# Patient Record
Sex: Male | Born: 1945
Health system: Southern US, Community
[De-identification: ages and names within clinical notes are randomized; demographics above are authoritative.]

## PROBLEM LIST (undated history)

## (undated) DIAGNOSIS — I10 Essential (primary) hypertension: Secondary | ICD-10-CM

## (undated) DIAGNOSIS — E785 Hyperlipidemia, unspecified: Secondary | ICD-10-CM

## (undated) DIAGNOSIS — I4891 Unspecified atrial fibrillation: Secondary | ICD-10-CM

## (undated) DIAGNOSIS — D126 Benign neoplasm of colon, unspecified: Secondary | ICD-10-CM

## (undated) DIAGNOSIS — I509 Heart failure, unspecified: Secondary | ICD-10-CM

## (undated) HISTORY — DX: Hyperlipidemia, unspecified: E78.5

## (undated) HISTORY — DX: Essential (primary) hypertension: I10

## (undated) HISTORY — DX: Benign neoplasm of colon, unspecified: D12.6

---
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
## 2001-01-14 DIAGNOSIS — D126 Benign neoplasm of colon, unspecified: Secondary | ICD-10-CM

## 2001-01-14 HISTORY — DX: Benign neoplasm of colon, unspecified: D12.6

## 2001-08-03 LAB — HM COLONOSCOPY

## 2004-06-10 ENCOUNTER — Emergency Department (HOSPITAL_COMMUNITY): Admission: EM | Admit: 2004-06-10 | Discharge: 2004-06-10 | Payer: Self-pay | Admitting: Emergency Medicine

## 2005-01-14 HISTORY — PX: ABDOMINAL AORTIC ANEURYSM REPAIR: SUR1152

## 2005-07-14 ENCOUNTER — Encounter (INDEPENDENT_AMBULATORY_CARE_PROVIDER_SITE_OTHER): Payer: Self-pay | Admitting: Specialist

## 2005-07-14 ENCOUNTER — Inpatient Hospital Stay (HOSPITAL_COMMUNITY): Admission: EM | Admit: 2005-07-14 | Discharge: 2005-08-13 | Payer: Self-pay | Admitting: Emergency Medicine

## 2005-07-14 ENCOUNTER — Ambulatory Visit: Payer: Self-pay | Admitting: Internal Medicine

## 2006-06-26 ENCOUNTER — Encounter: Admission: RE | Admit: 2006-06-26 | Discharge: 2006-06-26 | Payer: Self-pay | Admitting: Family Medicine

## 2007-03-17 IMAGING — CR DG CHEST 1V PORT
1 series · 1 of 1 positions shown · non-contrast
Comparison: 07/30/05.

CLINICAL DATA: Syncope.  Aneurysm repair.
 PORTABLE CHEST ONE VIEW, 07/31/05, 6646 HOURS:

[view not recorded]
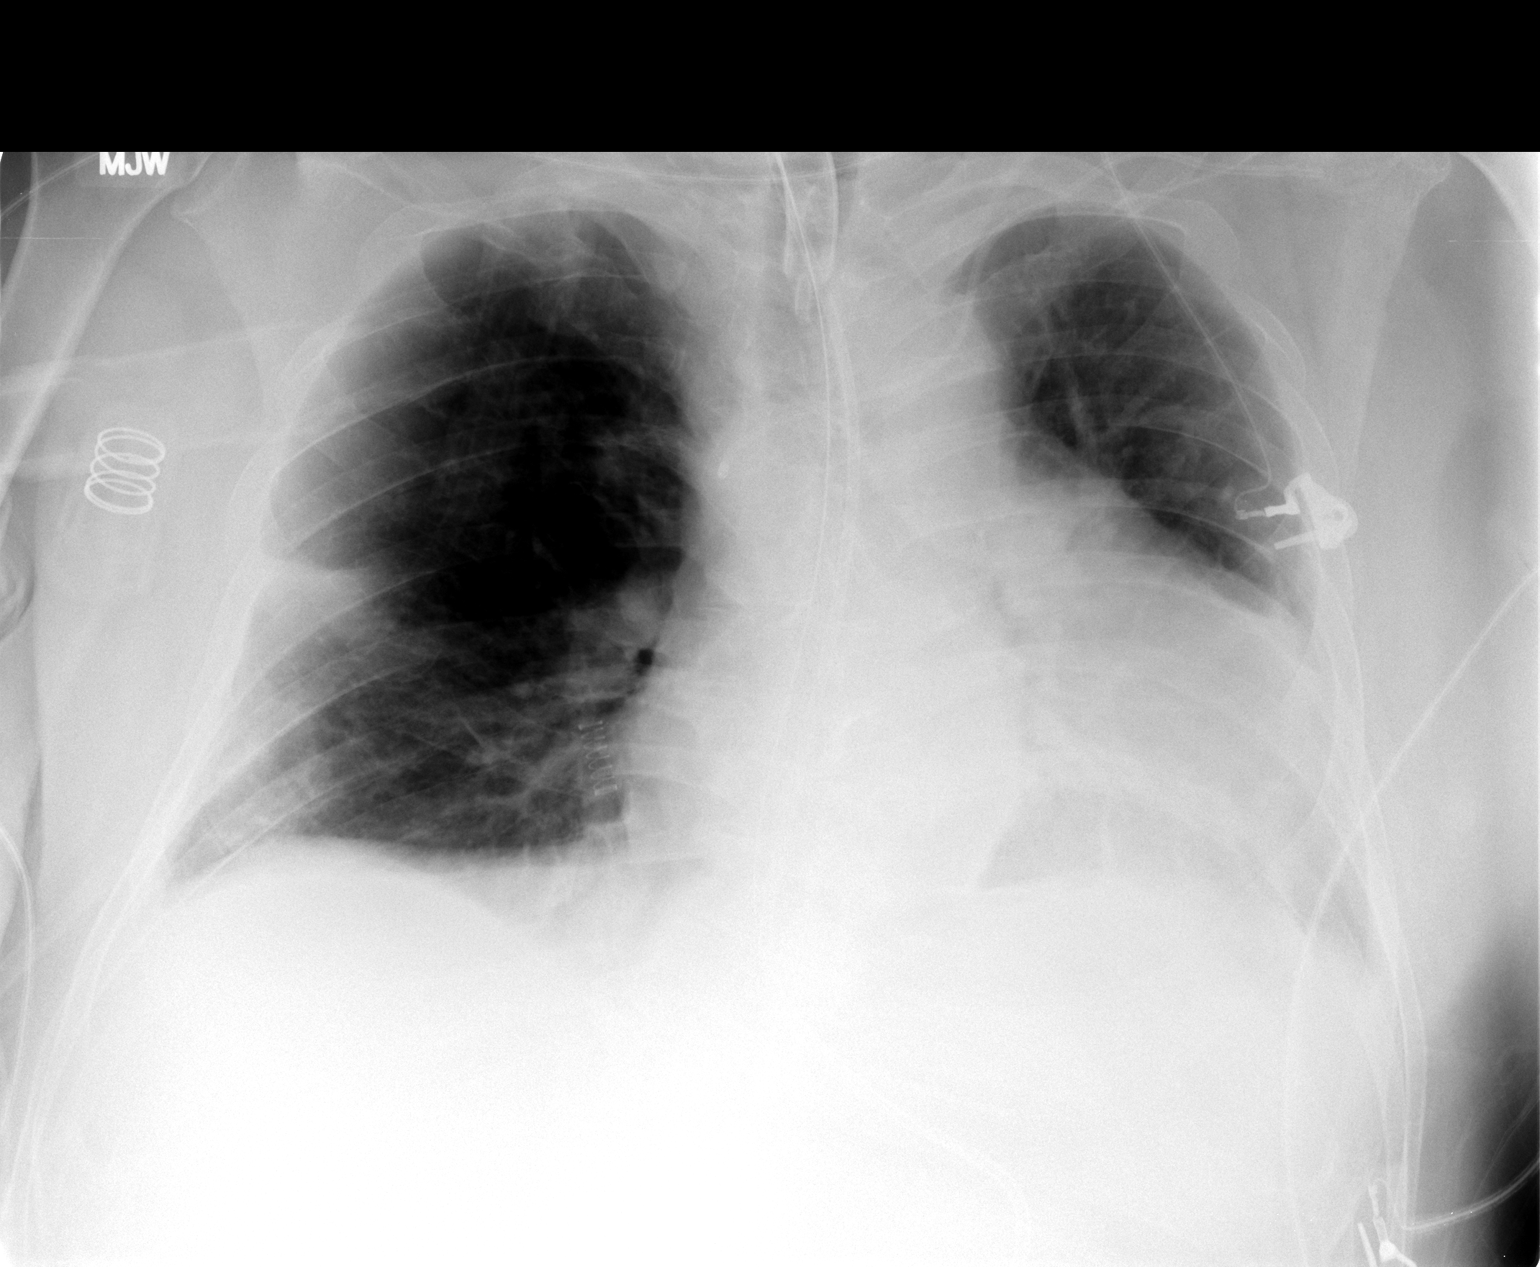

[1 of 1 positions shown; findings below may reference images not displayed]

FINDINGS: Tracheostomy, soft feeding tube, central line, and nasogastric tube all remain in place. Changes of atelectasis in both lungs persists.  No significant change since yesterday?s exam.
IMPRESSION: As above.

## 2007-03-22 IMAGING — RF DG ABDOMEN 1V
2 series · 2 of 2 positions shown · non-contrast
Comparison: none

CLINICAL DATA: Feeding tube placement.
 ABDOMEN ?1 VIEW ? 08/05/05:

[Series 2: run · 1 of 1 slices shown (1 of 2)]
[im 1/1]
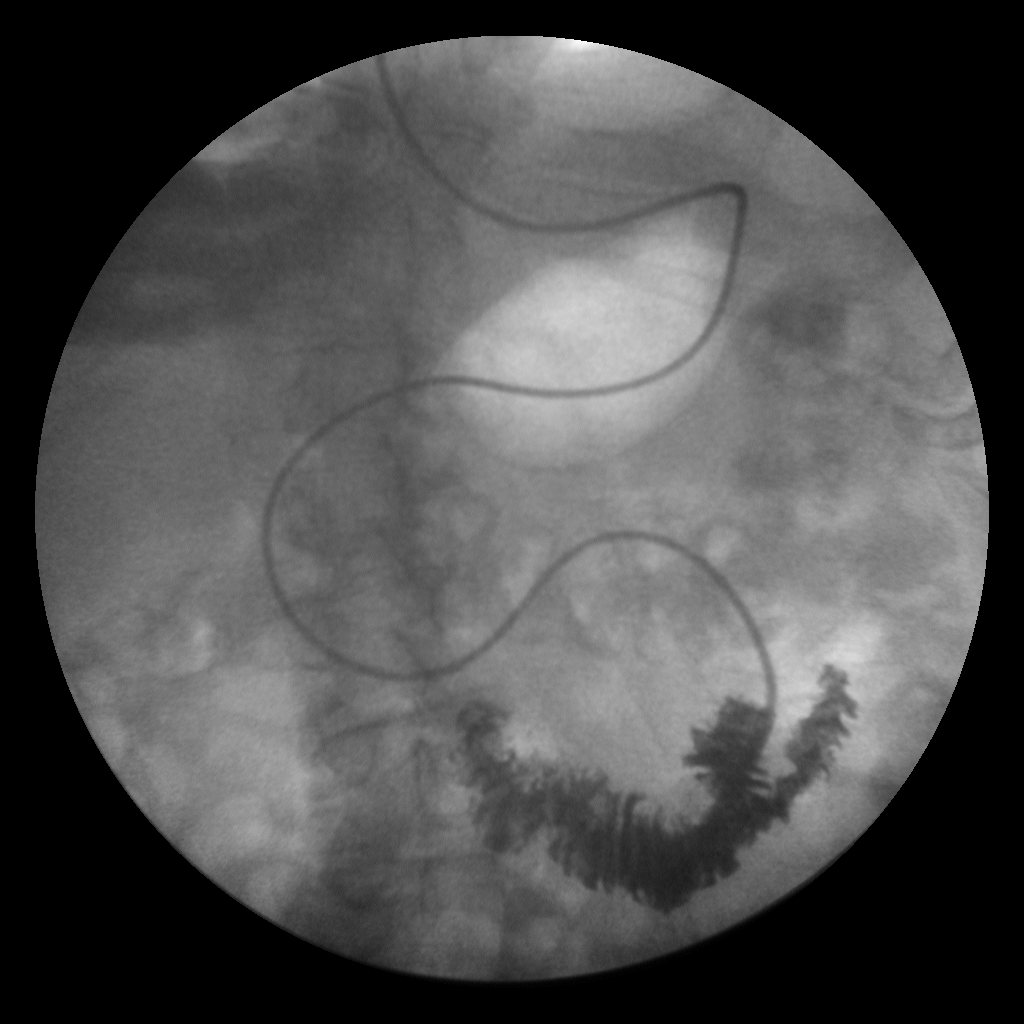

[Series 3: run · 1 of 1 slices shown (2 of 2)]
[im 1/1]
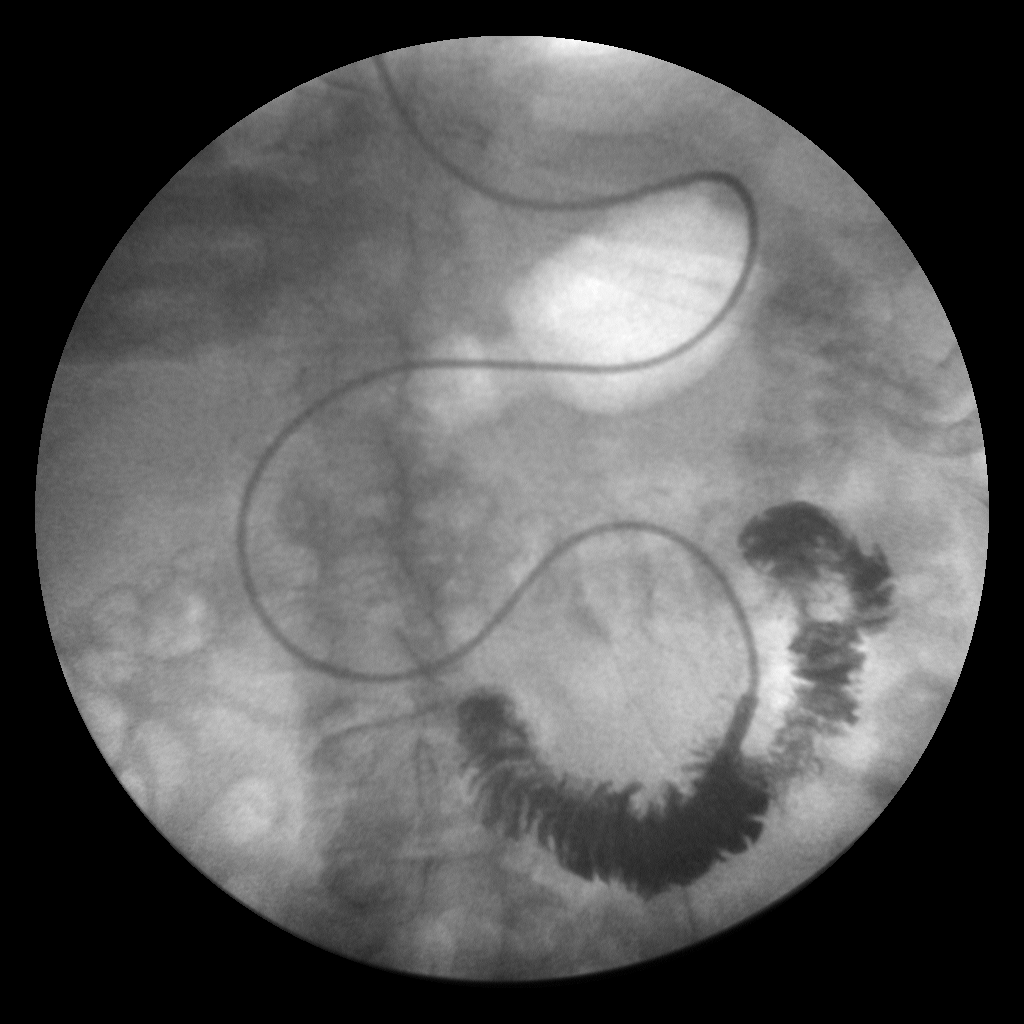

[2 of 2 positions shown; findings below may reference images not displayed]

FINDINGS: A feeding tube was placed with the tip of the tube located within the proximal jejunum slightly distal to the ligament of Treitz.
IMPRESSION: Feeding tube tip in satisfactory position within the proximal jejunum as discussed above.

## 2007-03-23 IMAGING — CR DG ABD PORTABLE 1V
1 series · 1 of 1 positions shown · non-contrast
Comparison: none

CLINICAL DATA: 60 year-old-male status-post panda placement. 
 PORTABLE ABDOMEN - 1 VIEW  - 08/06/05:

[view not recorded]
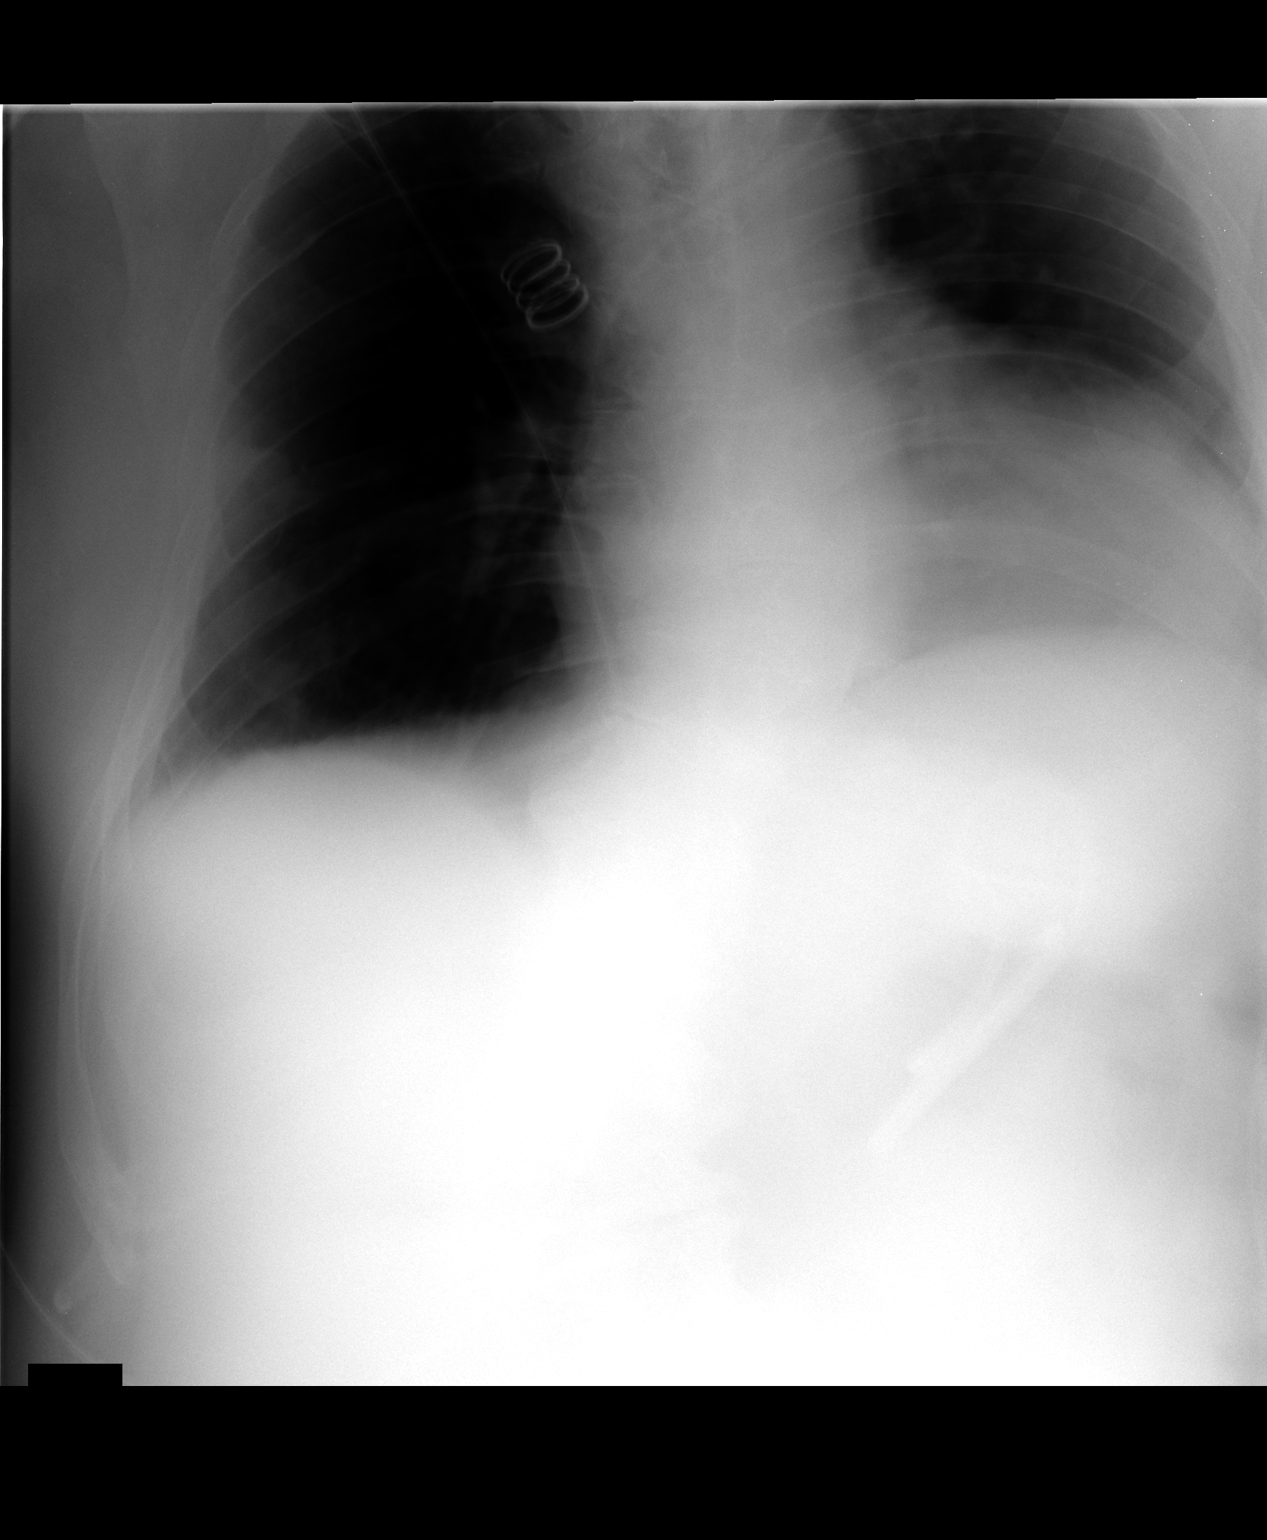

[1 of 1 positions shown; findings below may reference images not displayed]

FINDINGS: Small bore feeding tube appears to be coiled within the stomach.
IMPRESSION: As discussed above.

## 2009-06-14 DEATH — deceased

## 2010-03-19 ENCOUNTER — Emergency Department (INDEPENDENT_AMBULATORY_CARE_PROVIDER_SITE_OTHER): Payer: No Typology Code available for payment source

## 2010-03-19 ENCOUNTER — Emergency Department (HOSPITAL_BASED_OUTPATIENT_CLINIC_OR_DEPARTMENT_OTHER)
Admission: EM | Admit: 2010-03-19 | Discharge: 2010-03-19 | Disposition: A | Discharge status: Home / Self Care | Payer: No Typology Code available for payment source | Attending: Emergency Medicine | Admitting: Emergency Medicine

## 2010-03-19 ENCOUNTER — Emergency Department (HOSPITAL_BASED_OUTPATIENT_CLINIC_OR_DEPARTMENT_OTHER): Payer: No Typology Code available for payment source

## 2010-03-19 DIAGNOSIS — M542 Cervicalgia: Secondary | ICD-10-CM

## 2010-03-19 DIAGNOSIS — IMO0002 Reserved for concepts with insufficient information to code with codable children: Secondary | ICD-10-CM | POA: Insufficient documentation

## 2010-03-19 DIAGNOSIS — E119 Type 2 diabetes mellitus without complications: Secondary | ICD-10-CM | POA: Insufficient documentation

## 2010-03-19 DIAGNOSIS — M25519 Pain in unspecified shoulder: Secondary | ICD-10-CM

## 2010-03-19 DIAGNOSIS — M5030 Other cervical disc degeneration, unspecified cervical region: Secondary | ICD-10-CM

## 2010-03-19 DIAGNOSIS — W010XXA Fall on same level from slipping, tripping and stumbling without subsequent striking against object, initial encounter: Secondary | ICD-10-CM

## 2010-03-19 DIAGNOSIS — M19019 Primary osteoarthritis, unspecified shoulder: Secondary | ICD-10-CM

## 2010-03-19 DIAGNOSIS — Y9229 Other specified public building as the place of occurrence of the external cause: Secondary | ICD-10-CM | POA: Insufficient documentation

## 2010-03-19 DIAGNOSIS — I10 Essential (primary) hypertension: Secondary | ICD-10-CM | POA: Insufficient documentation

## 2010-04-17 ENCOUNTER — Encounter: Payer: Self-pay | Admitting: Family Medicine

## 2010-04-17 DIAGNOSIS — K5790 Diverticulosis of intestine, part unspecified, without perforation or abscess without bleeding: Secondary | ICD-10-CM | POA: Insufficient documentation

## 2010-04-17 DIAGNOSIS — I713 Abdominal aortic aneurysm, ruptured, unspecified: Secondary | ICD-10-CM | POA: Insufficient documentation

## 2010-04-17 DIAGNOSIS — K439 Ventral hernia without obstruction or gangrene: Secondary | ICD-10-CM | POA: Insufficient documentation

## 2010-04-17 DIAGNOSIS — E669 Obesity, unspecified: Secondary | ICD-10-CM

## 2010-04-17 DIAGNOSIS — E119 Type 2 diabetes mellitus without complications: Secondary | ICD-10-CM | POA: Insufficient documentation

## 2010-04-17 DIAGNOSIS — N500 Atrophy of testis: Secondary | ICD-10-CM | POA: Insufficient documentation

## 2010-04-17 DIAGNOSIS — I10 Essential (primary) hypertension: Secondary | ICD-10-CM | POA: Insufficient documentation

## 2010-06-01 NOTE — Discharge Summary (Signed)
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   DISCHARGE DIAGNOSES:  1.  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  2.  Hypertension.  3.  History of smoking.  4.  Adult respiratory distress syndrome, postoperative aortic aneurysm.  5.  Acute blood loss anemia.   CONSULTATIONS:  On July 14, 2005 pulmonary was consultated.   PROCEDURES:  1.  On July 14, 2005 the patient underwent emergent resection and graft of      ruptured large left common iliac aneurysm with insertion of an aorta to      the right common iliac and left external iliac bypass.  Reimplantation      of the mesenteric artery to the aortic graft, thrombolectomy of      bilateral iliofemoral systems by Dr. Allen Derry.  2.  On July 24, 2005 the patient underwent a tracheostomy with a #8 Shiley      by Dr. Claudette Laws.   HISTORY AND PHYSICAL:  This is a 65 year old healthy patient who developed  left lower quadrant abdominal pain approximately midnight and then had a  syncopal episode.  The patient had no previous episodes of this pain.  He  was immediately taken to the [facility] emergency room by EMS at which time  he was found to have a blood pressure of 80/40 which required IV fluid  infusion.  A CT scan of the abdomen was obtained which revealed normal aorta  below the renal arteries to the left iliac artery aneurysm with a  retroperitoneal hematoma of the psoas muscle.  Right iliac artery was  slightly enlarged.  He was then admitted for emergency repair of the  ruptured iliac aneurysm.   HOSPITAL COURSE:   Postoperatively, the patient was slow to progress due to  his respiratory function.  The patient was found to be vent dependent,  oxygen requirements were low.  The patient's abdominal problem was still an  issue.  Pulmonary saw the patient daily to assess his respiratory function.  Around postoperative day #4, PSV trials were started.  The patient had a  dull respiratory distress syndrome and needed full vent support.  A  tracheostomy tube was inserted on July 24, 2005.  The patient was slowly  weaned off of the trach started on July 26, 2005.  The patient's trach was  able to be downsized on August 07, 2005 to a #6.  On August 09, 2005 the patient  downsized trach to a #4 cuff lift.  The patient tolerated the downsizing of  the trach well with his oxygen saturation in 95%.  The patient's trach was  discontinued on August 12, 2005 without  any difficulty.  The patient's sats  are currently 95-97% on room air.  The patient has continued aggressive  pulmonary toilet postoperatively.  The patient was transferred to 3300 on  August 07, 2005.  He was transferred to 2000 on August 11, 2005.   The patient did have some malnourishment issues postoperatively.  The  patient was started on TNA on July 18, 2005.  The patient's TNA was decreased  on August 01, 2005 and leads were started at a slow rate.  The patient's TNA  was discontinued on August 06, 2005.  The patient had a swallow evaluation by  speech on August 06, 2005.  The swallow evaluation showed difficulty with p.o.  and the patient tolerated this well.  The patient was having good p.o.  intake on August 08, 2005.  The patient continued to tolerate his diet.  The  patient's abdomen has been distended on postoperative day #1 on August 06, 2005.  He did have good bowel sounds throughout his day.  His NG tube did  have high output postoperatively.  The patient's GI issues have resolved by  the time of discharge.  The patient was weaned from his drips by transfer  to  3300 on August 07, 2005.  The patient did have some confusion postoperatively,  however this has improved.  The patient did experience some hypokalemia  postoperatively, his potassium was replaced and he has had no further  issues.  The patient's renal function has remained in good function.  The  patient experienced load postoperatively and he was diuresed appropriately.   The patient had some postoperative acetonemia on July 17, 2005.  The  patient's hematocrit and hemoglobin were decreasing and he was given 1 unit  of packed red blood cells.  His hemoglobin did raise appropriately.  The  patient did however need another unit of packed red blood cells on July 26, 2005.  Since then, the patient's hemoglobin and hematocrit have remained  stable and he has not needed any further transfusions.  The patient did have  a fever in the 100s postoperatively starting on July 19, 2005.  The patient  was started on empiric antibiotics with vancomycin and Zosyn.  Vancomycin  and Zosyn were discontinued on July 26, 2005 and the patient was started on  Cipro p.o. and Maxipime.  The patient was found to have positive UTI,  culture reporting E coli on July 21, 2005.  This has resolved by the time of  discharge.  The patient's lines were replaced beginning with his fevers on  July 19, 2005.  The patient has remained afebrile since July 26, 2005.   The patient did experience a Stage 2 decubitus ulcer in his right buttocks.  This has improved by August 07, 2005.  The patient's incisions have been  healing well.  His graft remained patent with 2+ dorsalis pedis and  posterior tibial pulses.   The patient did experience some atrial fibrillation, new onset at the rate  of 140s on July 21, 2005.  The patient was started on amiodarone and digoxin  immediately.  The patient converted into normal sinus rhythm.  He did not have any further episodes of atrial fibrillation.  Amiodarone was  discontinued on July 25, 2005.  The patient was started on Lopressor every 8  hours.  The patient's blood pressure has remained stable.  It is currently  133/63.  The patient is now receiving Toprol XL 500 mg p.o. daily and  tolerating this well.  The patient was started on Lovenox on August 07, 2005  for DVT prophylaxis.  The patient started physical therapy on August 05, 2005.  The patient is ambulating well on his own without any difficulties.   The patient has been on a sliding scale insulin to manage his blood sugars.  The patient's blood sugars have been well controlled and he will probably  not need any diabetic medication at discharge.  The patient did experience  some loose bowel movements on August 10, 2005.  A stool culture was sent which  was positive for C difficile on August 11, 2005 and the patient was started on  Flagyl on August 11, 2005.  The lab called on August 12, 2005 and said the C  difficile was an error and the Flagyl was discontinued.  The patient's loose  bowel movements have improved since August 10, 2005 and he has had no further  episodes.  The patient is to be discharged home tomorrow pending he remains  stable and tolerates the trach decannulation well.   DISCHARGE PHYSICAL EXAM:  Vital signs: Blood pressure 131/87, heart rate 76,  respirations 18, temperature 99, O2 sats 97% on room air.  CVS: Regular rate and rhythm.  Lungs: Clear to auscultation bilaterally.  Abdomen: Soft, nontender, nondistended.  Bowel sounds positive.  Extremities: No edema.  Dorsalis pedis and posterior tibial pulses 2+  bilaterally.  Incisions healed well.   LABORATORY DATA:  Show a white blood cell count of 10.5, hemoglobin 11.4,  hematocrit 34.4, platelet count of 287.  BMP shows sodium 136, potassium  3.4, chloride 104, CO2 24, BUN 13.  Glucose 120 and calcium of 8.6.   DISCHARGE CONDITION:  The patient will be discharged to home in good  condition.   MEDICATIONS:  1.  Aspirin 325 mg p.o. daily.  2.  Nasal  spray as needed.  3.  Toprol XL 25 mg p.o. daily.   INSTRUCTIONS:  The patient instructed to follow a low fat, low salt diet.  Ambulate 3-4 times daily.  Increase activity as tolerated.  The patient may  shower and clean with mild soap and water.  The patient is to continue his  breathing exercises.  He is to do no driving, heavy lifting for 3 weeks.  The patient is to call the office with any problems such as incision  erythema, drainage, temperature greater than 101.5 or shortness of breath.   FOLLOWUP:  The patient will have followup appointment with Dr. Hart Rochester on  August 27, 2005 at 4:00 p.m.      Constance Holster, PA     JMW/MEDQ  D:  08/12/2005  T:  08/12/2005  Job:  [id number]

## 2010-06-01 NOTE — Op Note (Signed)
NAMESARVESH, MEDDAUGH                ACCOUNT NO.:  [id number]   MEDICAL RECORD NO.:  [id number]          PATIENT TYPE:  INP   LOCATION:  2305                         FACILITY:  [facility]   PHYSICIAN:  Quita Skye. Hart Rochester, M.D.  DATE OF BIRTH:  1945/01/21   DATE OF PROCEDURE:  07/14/2005  DATE OF DISCHARGE:                                 OPERATIVE REPORT   PREOPERATIVE DIAGNOSIS:  Ruptured large left common iliac artery aneurysm  with hypotension.   POSTOPERATIVE DIAGNOSES:  1.  Ruptured large left common iliac artery aneurysm with hypotension.  2.  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   OPERATIONS:  1.  Resection and grafting of a ruptured large left common iliac artery      aneurysm with insertion of an aorto to the right common iliac and left      external iliac bypass.  2.  Reimplantation of inferior mesenteric artery into the aortic graft.  3.  Thromboembolectomy of bilateral iliofemoral systems.   SURGEON:  Quita Skye. Hart Rochester, M.D.   FIRST ASSISTANT:  Jerold Coombe, P.A.   ANESTHESIA:  General endotracheal.   BRIEF HISTORY:  This 65 year old patient suffered acute onset of left lower  quadrant pain followed by syncope and was taken to the [facility] Emergency  Department, where a CT scan revealed a 7-cm left common iliac artery with  hematoma in the left retroperitoneal extending up to the aortic bifurcation.  He was taken emergently to the operating room for resection and grafting of  this ruptured aneurysm.  He had become hypotensive in the emergency  department into the low 80 systolic range, which responded to fluid  administration.   PROCEDURE:  The patient was taken emergently to the operating room and Swan-  Ganz catheter and radial arterial line were inserted by Anesthesia.  Attempt  was made to insert a nasogastric tube, both at the beginning of the  procedure and during the procedure and this was unsuccessful and an  orogastric tube was  inserted.  The patient initially was stable  hemodynamically upon entering the operating room, but while the lines were  being inserted, he rapidly decompensated, developed profound hypotension  with systolics in the 40s.  A midline incision was made from xiphoid to  pubis and carried down through subcutaneous tissue and linea alba after  prepping and draping in routine sterile manner.  There was a moderate amount  of blood in the peritoneal cavity and a massive left gutter and flank  retroperitoneal hematoma with a large left common iliac artery aneurysm  palpable.  Initially, the transverse colon was elevated and intestines were  reflected to the right side, retroperitoneum incised and the aorta was  controlled manually distal to the renal arteries and then clamped with a  DeBakey aneurysm clamp.  Attention was then turned to the left lower  quadrant, where the retroperitoneum was incised and this large aneurysm was  identified and the external and internal iliac arteries were occluded with  vascular clamps on the left.  The remainder of the aorta was exposed  medially and  the right common iliac artery was also occluded with a vascular  clamp.  There was an aneurysm in the midportion of the right common iliac  artery, but distal to this, there was a posterior plaque, but it was of  reasonable size.  This controlled any further bleeding, although there was  approximately 5-6 units of blood and the large retroperitoneal space in the  gutter area.  The patient responded to clamping the aorta and infusion of  emergency-released blood and colloid fluids.  The aorta itself was of  relatively normal size, but had diffuse atherosclerotic changes extending up  to the renal arteries.  Three thousand units of heparin were given after  opening the aorta because of thrombus within the aortic lumen.  The neck of  the aorta was transected distal to the renal arteries, about 3 cm, and the  entire aorta  opened longitudinally a few lumbars oversewn with 2-0 silk  figure-of-eight sutures.  The inferior mesenteric artery was preserved for  later reimplantation.  A 14 x 8-mm Hemashield Dacron graft was anastomosed  end-to-end to the aortic stump after endarterectomizing in the proximal  aortic stump up to the clamp.  This anastomosis done with 3-0 Prolene,  buttressing with a the strip of felt.  It was checked for leaks, none were  present, but an 18-mm cuff was placed over the endarterectomized segment of  the aorta for external support.  The left limb was delivered through the  large aneurysm into the pelvic area, where an end-to-end anastomosis was  done to the left external iliac artery.  Prior to that, a 4 and a 3 Fogarty  catheter were passed distally with some difficulty, but on 1 occasion, I did  get it down to the ankle level and a fairly extensive amount of thrombotic  material was retrieved followed by a good backbleeding.  After completion of  the left external iliac and anastomosis, the internal iliac artery was  ligated with an umbilical tape and oversewn with Prolene.  On the right  side, an end-to-end anastomosis was done to the distal common iliac artery  and again, the Fogarty catheter was passed to the ankle level with a  significant amount of thrombus retrieved followed by a good backbleeding.  Following completion of the right leg anastomosis, both legs were opened  slowly, continuing to administer blood transfusions.  The patient's blood  pressure had been relatively stable following the initial 15 minutes of  resuscitation, remaining and the 90-120 range throughout the procedure.  He  did require, however, continued blood transfusions of fresh frozen plasma  and platelets were given following opening of the legs.  The inferior  mesenteric artery was then examined and it had very sluggish back-bleeding  and it was then reimplanted into the left limb of the aorta-bi-iliac  graft after opening it with an  11 blade and a 4-mm punch and this anastomosis was  done with 6-0 Prolene.  There was excellent Doppler flow in this vessel  following the reanastomosis.  Following this, 50 mg of protamine were given  to reverse the heparin.  Hemostasis was achieved as well as possible.  The  retroperitoneal areas were all closed with 3-0 Vicryl.  Multiple attempts  were made to insert a nasogastric tube from Anesthesia and an orogastric  tube was inserted.  The linea alba was closed with #1 Prolene and the skin  with clips, sterile dressing applied and patient taken to the recovery room  in  relatively stable condition.  He had had 14 units of blood from the blood  bank, 5 units of Cell Saver blood, 4 units of fresh frozen plasma and 10  packs of platelets and had excellent urinary output.           ______________________________  Quita Skye. Hart Rochester, M.D.     JDL/MEDQ  D:  07/14/2005  T:  07/15/2005  Job:  [id number]

## 2010-06-01 NOTE — H&P (Signed)
Charles Bailey, Charles Bailey                ACCOUNT NO.:  [id number]   MEDICAL RECORD NO.:  [id number]          PATIENT TYPE:  INP   LOCATION:  2305                         FACILITY:  [facility]   PHYSICIAN:  Quita Skye. Hart Rochester, M.D.  DATE OF BIRTH:  03/20/1945   DATE OF ADMISSION:  07/14/2005  DATE OF DISCHARGE:                                HISTORY & PHYSICAL   PREOPERATIVE DIAGNOSIS:  Ruptured large iliac artery aneurysm.   HISTORY OF PRESENT ILLNESS:  This 65 year old healthy patient developed left  lower quadrant abdominal pain approximately midnight and then had a syncopal  episode.  He had no previous episode of this pain.  He was taken immediately  to the [facility] Emergency Room by EMS at which time he was found to have a blood  pressure of 80/40 which responded to IV fluid infusion.  A CT scan of the  abdomen was obtained which revealed relatively normal sized aorta below the  renal arteries with a 7 cm left iliac artery aneurysm with a retroperitoneal  hematoma over the psoas muscle.  Right iliac artery was slightly enlarged.  He was then admitted for emergency repair of a ruptured iliac aneurysm.   PAST MEDICAL HISTORY:  Hypertension.  Negative for coronary artery disease,  diabetes, stroke or COPD.   PAST SURGERIES:  Previous surgery includes a right inguinal hernia repair.   SOCIAL HISTORY:  The patient smokes one pack of cigarettes per day, has done  so for 40 years.   FAMILY HISTORY:  The patient's father had a ruptured abdominal aortic  aneurysm requiring surgery and eventually expired from adhesions and small  bowel obstruction according to the patient.   ALLERGIES:  NONE KNOWN.   MEDICATIONS:  1.  Lopressor 50 mg one p.o. b.i.d.  2.  Toprol 5 mg p.o. daily.   PHYSICAL EXAMINATION:  VITAL SIGNS:  Blood pressure is now 120/80, heart  rate is 90 and respirations of 14.  GENERAL:  He is a healthy-appearing middle-aged male who is in no apparent  distress although complaining of  left lower quadrant pain.  NECK:  His neck is supple with 3+ carotid pulses.  No palpable adenopathy in  the neck.  Upper extremity pulses 3+ and symmetrical.  CHEST:  Clear to auscultation.  CARDIOVASCULAR:  Exam reveals a regular rhythm.  No murmurs.  ABDOMEN:  His abdomen is distended with tenderness in the left lower  quadrant, suggestion of a pulsatile mass.  EXTREMITIES:  He has 3+ femoral, popliteal and posterior tibial pulses  palpable bilaterally.   IMPRESSION:  1.  Leaking large left common iliac artery aneurysm.  2.  Hypertension.   PLAN:  Plan is to immediately take the patient to the operating room for  emergency resection and grafting with insertion of aortobi-iliac graft.           ______________________________  Quita Skye. Hart Rochester, M.D.     JDL/MEDQ  D:  07/14/2005  T:  07/14/2005  Job:  [id number]

## 2010-06-01 NOTE — Op Note (Signed)
NAMEJEROLD, YOSS                ACCOUNT NO.:  [id number]   MEDICAL RECORD NO.:  [id number]          PATIENT TYPE:  INP   LOCATION:  2305                         FACILITY:  [facility]   PHYSICIAN:  Quita Skye. Hart Rochester, M.D.  DATE OF BIRTH:  15-Nov-1945   DATE OF PROCEDURE:  07/24/2005  DATE OF DISCHARGE:                                 OPERATIVE REPORT   PREOPERATIVE DIAGNOSIS:  Ventilation dependent adult respiratory distress  syndrome, post ruptured aortic aneurysm.   POSTOPERATIVE DIAGNOSIS:  Ventilation dependent adult respiratory distress  syndrome, post ruptured aortic aneurysm.   PROCEDURE:  Tracheostomy using a number 8 Shiley.   SURGEON:  Dr. Hart Rochester.   FIRST ASSISTANT:  Charlesetta Garibaldi, P.A.   ANESTHESIA:  General endotracheal.   PROCEDURE:  Patient was taken to the operating room, placed in the supine  position, at which time upper chest and neck were prepped with Betadine  scrubbing solution, and draped in a routine sterile manner.  A transverse  incision was made 2-3 cm above the sternal notch.  Dissection carried down  between the strap muscles.  The veins along the anterior aspect of the  trachea were divided between 3-0 silk tie ligatures, and the trachea was  exposed.  Using the tracheal hook, the trachea was elevated and the second  and third tracheal rings were incised longitudinally with an 11-blade.  The  endotracheal tube was slowly withdrawn, and the number 8 Shiley tube  inserted, under direct vision, without difficulty.  The cuff was inflated,  the inner cannula inserted, and the patient easily ventilated through the  tracheostomy tube.  Endotracheal tube was then removed and the oral  orogastric tube was exchanged for a nasogastric tube without difficulty.  Sterile dressing applied and the patient returned to surgical intensive care  unit in stable condition.           ______________________________  Quita Skye Hart Rochester, M.D.     JDL/MEDQ  D:  07/24/2005  T:   07/24/2005  Job:  [id number]

## 2010-06-26 ENCOUNTER — Encounter: Payer: Self-pay | Admitting: Gastroenterology

## 2010-07-25 ENCOUNTER — Ambulatory Visit (AMBULATORY_SURGERY_CENTER): Payer: BC Managed Care – PPO | Admitting: *Deleted

## 2010-07-25 DIAGNOSIS — Z1211 Encounter for screening for malignant neoplasm of colon: Secondary | ICD-10-CM

## 2010-08-08 ENCOUNTER — Other Ambulatory Visit: Payer: No Typology Code available for payment source | Admitting: Internal Medicine

## 2011-02-18 ENCOUNTER — Encounter: Payer: Self-pay | Admitting: Internal Medicine

## 2011-02-21 ENCOUNTER — Ambulatory Visit (AMBULATORY_SURGERY_CENTER): Payer: BC Managed Care – PPO | Admitting: *Deleted

## 2011-02-21 VITALS — Ht 68.0 in | Wt 209.0 lb

## 2011-02-21 DIAGNOSIS — Z1211 Encounter for screening for malignant neoplasm of colon: Secondary | ICD-10-CM

## 2011-02-21 MED ORDER — PEG-KCL-NACL-NASULF-NA ASC-C 100 G PO SOLR: ORAL | Status: DC

## 2011-03-04 ENCOUNTER — Encounter: Payer: Self-pay | Admitting: Internal Medicine

## 2011-03-04 ENCOUNTER — Ambulatory Visit (AMBULATORY_SURGERY_CENTER): Payer: BC Managed Care – PPO | Admitting: Internal Medicine

## 2011-03-04 VITALS — BP 174/100 | HR 48 | Temp 98.2°F | Resp 17 | Ht 68.0 in | Wt 209.0 lb

## 2011-03-04 DIAGNOSIS — D126 Benign neoplasm of colon, unspecified: Secondary | ICD-10-CM

## 2011-03-04 DIAGNOSIS — K5790 Diverticulosis of intestine, part unspecified, without perforation or abscess without bleeding: Secondary | ICD-10-CM

## 2011-03-04 DIAGNOSIS — Z86010 Personal history of colonic polyps: Secondary | ICD-10-CM

## 2011-03-04 DIAGNOSIS — K5730 Diverticulosis of large intestine without perforation or abscess without bleeding: Secondary | ICD-10-CM

## 2011-03-04 DIAGNOSIS — Z1211 Encounter for screening for malignant neoplasm of colon: Secondary | ICD-10-CM

## 2011-03-04 LAB — GLUCOSE, CAPILLARY
Glucose-Capillary: 130 mg/dL — ABNORMAL HIGH (ref 70–99)
Glucose-Capillary: 111 mg/dL — ABNORMAL HIGH (ref 70–99)

## 2011-03-04 MED ORDER — SODIUM CHLORIDE 0.9 % IV SOLN: 500.0000 mL | INTRAVENOUS | Status: DC

## 2011-03-04 NOTE — Progress Notes (Signed)
Patient did not experience any of the following events: a burn prior to discharge; a fall within the facility; wrong site/side/patient/procedure/implant event; or a hospital transfer or hospital admission upon discharge from the facility. (G8907) Patient did not have preoperative order for IV antibiotic SSI prophylaxis. (G8918)  

## 2011-03-04 NOTE — Patient Instructions (Addendum)

## 2011-03-04 NOTE — Op Note (Signed)
[facility] [address] [place], [place]  [address]  COLONOSCOPY PROCEDURE REPORT  PATIENT:  Charles Bailey, Charles Bailey  MR#:  [id number] BIRTHDATE:  1945/04/09, 65 yrs. old  GENDER:  male ENDOSCOPIST:  Wilhemina Bonito. Eda Keys, MD REF. BY:  Surveillance Program Recall, PROCEDURE DATE:  03/04/2011 PROCEDURE:  Colonoscopy with snare polypectomy x 5 ASA CLASS:  Class II INDICATIONS:  history of pre-cancerous (adenomatous) colon polyps, surveillance and high-risk screening ; INDEX EXAM 07-2001 W/ SMALL TAs (overdue for followup) MEDICATIONS:   MAC sedation, administered by CRNA, propofol (Diprivan) 500 mg IV  DESCRIPTION OF PROCEDURE:   After the risks benefits and alternatives of the procedure were thoroughly explained, informed consent was obtained.  Digital rectal exam was performed and revealed no abnormalities.   The LB CF-[id number] endoscope was introduced through the anus and advanced to the cecum, which was identified by both the appendix and ileocecal valve, without limitations.  The quality of the prep was good, using MoviPrep. The instrument was then slowly withdrawn as the colon was fully examined. <<PROCEDUREIMAGES>>  FINDINGS:  Five polyps were found - 9mm sessile in the cecum, 49mm,2mm, 5mm, and 8mm in ascending colon. Polyps were snared without cautery. Retrieval was successful. Moderately Severe diverticulosis was found in the right and left colon. Retroflexed views in the rectum revealed internal hemorrhoids. The time to cecum = 2:43  minutes. The scope was then withdrawn in 17:10  minutes from the cecum and the procedure completed.  COMPLICATIONS:  None ENDOSCOPIC IMPRESSION: 1) Five polyps - removed 2) Moderately Severe diverticulosis in the right and  left colon  3) Internal hemorrhoids  RECOMMENDATIONS: 1) Repeat Colonoscopy in 3 or 5 years, pending pathology.  ______________________________ Wilhemina Bonito. Eda Keys, MD  CC:  The Patient  n. eSIGNED:   Wilhemina Bonito. Eda Keys at 03/04/2011 03:02 PM  Theador Hawthorne, [phone]

## 2011-03-05 ENCOUNTER — Telehealth: Payer: Self-pay

## 2011-03-05 NOTE — Telephone Encounter (Signed)
  Follow up Call-  Call back number 03/04/2011  Post procedure Call Back phone  # [phone]  Permission to leave phone message Yes     Patient questions:  Do you have a fever, pain , or abdominal swelling? no Pain Score  0 *  Have you tolerated food without any problems? yes  Have you been able to return to your normal activities? yes  Do you have any questions about your discharge instructions: Diet   no Medications  no Follow up visit  no  Do you have questions or concerns about your Care? no  Actions: * If pain score is 4 or above: No action needed, pain <4.

## 2011-03-07 ENCOUNTER — Encounter: Payer: Self-pay | Admitting: Internal Medicine

## 2012-04-17 ENCOUNTER — Other Ambulatory Visit: Payer: Self-pay | Admitting: Family Medicine

## 2012-04-20 ENCOUNTER — Telehealth: Payer: Self-pay | Admitting: Family Medicine

## 2012-04-20 MED ORDER — PRAVASTATIN SODIUM 40 MG PO TABS: 40.0000 mg | ORAL_TABLET | Freq: Every day | ORAL | Status: DC

## 2012-04-20 MED ORDER — LISINOPRIL 40 MG PO TABS: 40.0000 mg | ORAL_TABLET | Freq: Every day | ORAL | Status: DC

## 2012-04-20 NOTE — Telephone Encounter (Signed)
rx refilled.

## 2012-06-11 ENCOUNTER — Other Ambulatory Visit: Payer: Self-pay | Admitting: Family Medicine

## 2012-06-12 ENCOUNTER — Other Ambulatory Visit: Payer: Self-pay | Admitting: Family Medicine

## 2012-06-15 ENCOUNTER — Telehealth: Payer: Self-pay | Admitting: Family Medicine

## 2012-06-15 MED ORDER — GLIMEPIRIDE 2 MG PO TABS: 2.0000 mg | ORAL_TABLET | Freq: Every day | ORAL | Status: DC

## 2012-06-15 NOTE — Telephone Encounter (Signed)
Rx Refilled  

## 2012-07-02 ENCOUNTER — Telehealth: Payer: Self-pay | Admitting: Family Medicine

## 2012-07-03 MED ORDER — METOPROLOL TARTRATE 50 MG PO TABS: 50.0000 mg | ORAL_TABLET | Freq: Two times a day (BID) | ORAL | Status: DC

## 2012-07-03 MED ORDER — METFORMIN HCL 1000 MG PO TABS: 1000.0000 mg | ORAL_TABLET | Freq: Two times a day (BID) | ORAL | Status: DC

## 2012-07-03 MED ORDER — GLIMEPIRIDE 2 MG PO TABS: 2.0000 mg | ORAL_TABLET | Freq: Every day | ORAL | Status: DC

## 2012-07-03 MED ORDER — LISINOPRIL 40 MG PO TABS: 40.0000 mg | ORAL_TABLET | Freq: Every day | ORAL | Status: DC

## 2012-07-03 NOTE — Telephone Encounter (Signed)
Rx Refilled and pt will need ov before further refills

## 2012-07-08 ENCOUNTER — Telehealth: Payer: Self-pay | Admitting: Family Medicine

## 2012-07-08 MED ORDER — CLONIDINE HCL 0.2 MG PO TABS: 0.2000 mg | ORAL_TABLET | Freq: Two times a day (BID) | ORAL | Status: DC

## 2012-07-08 MED ORDER — PRAVASTATIN SODIUM 40 MG PO TABS: 40.0000 mg | ORAL_TABLET | Freq: Every day | ORAL | Status: DC

## 2012-07-08 NOTE — Telephone Encounter (Signed)
Rx Refilled  

## 2012-07-15 ENCOUNTER — Telehealth: Payer: Self-pay | Admitting: Family Medicine

## 2012-07-15 MED ORDER — METOPROLOL TARTRATE 50 MG PO TABS: 50.0000 mg | ORAL_TABLET | Freq: Two times a day (BID) | ORAL | Status: DC

## 2012-07-15 NOTE — Telephone Encounter (Signed)
Med refilled for 30 days pt has made appt already

## 2012-07-27 ENCOUNTER — Ambulatory Visit: Payer: Self-pay | Admitting: Family Medicine

## 2012-10-06 ENCOUNTER — Telehealth: Payer: Self-pay | Admitting: Family Medicine

## 2012-10-06 NOTE — Telephone Encounter (Addendum)
Glimepiride 2 mg tab 1 QD #90 Lisinopril 40 mg tab 1 QD #90 Metformin 1000 mg tab 1 BID #180 Metoprolol 50 mg 1 BID #180

## 2012-10-07 MED ORDER — LISINOPRIL 40 MG PO TABS: 40.0000 mg | ORAL_TABLET | Freq: Every day | ORAL | Status: DC

## 2012-10-07 MED ORDER — METFORMIN HCL 1000 MG PO TABS: 1000.0000 mg | ORAL_TABLET | Freq: Two times a day (BID) | ORAL | Status: DC

## 2012-10-07 MED ORDER — GLIMEPIRIDE 2 MG PO TABS: 2.0000 mg | ORAL_TABLET | Freq: Every day | ORAL | Status: DC

## 2012-10-07 MED ORDER — METOPROLOL TARTRATE 50 MG PO TABS: 50.0000 mg | ORAL_TABLET | Freq: Two times a day (BID) | ORAL | Status: DC

## 2012-10-07 NOTE — Telephone Encounter (Signed)
.  Rx Refilled for 90 days..will send letter to schedule office visit

## 2012-11-30 ENCOUNTER — Other Ambulatory Visit: Payer: Self-pay | Admitting: Family Medicine

## 2012-11-30 ENCOUNTER — Other Ambulatory Visit: Payer: Medicare Other

## 2012-11-30 DIAGNOSIS — E1059 Type 1 diabetes mellitus with other circulatory complications: Secondary | ICD-10-CM

## 2012-11-30 DIAGNOSIS — I10 Essential (primary) hypertension: Secondary | ICD-10-CM

## 2012-11-30 DIAGNOSIS — E785 Hyperlipidemia, unspecified: Secondary | ICD-10-CM

## 2012-11-30 DIAGNOSIS — E559 Vitamin D deficiency, unspecified: Secondary | ICD-10-CM

## 2012-11-30 LAB — CBC WITH DIFFERENTIAL/PLATELET
Basophils Relative: 1 % (ref 0–1)
Hemoglobin: 14.5 g/dL (ref 13.0–17.0)
Lymphs Abs: 2.1 10*3/uL (ref 0.7–4.0)
Monocytes Relative: 11 % (ref 3–12)
Neutro Abs: 3.6 10*3/uL (ref 1.7–7.7)
Neutrophils Relative %: 52 % (ref 43–77)
RBC: 4.9 MIL/uL (ref 4.22–5.81)

## 2012-11-30 LAB — LIPID PANEL
LDL Cholesterol: 49 mg/dL (ref 0–99)
Total CHOL/HDL Ratio: 3.2 Ratio

## 2012-11-30 LAB — COMPLETE METABOLIC PANEL WITH GFR
ALT: 11 U/L (ref 0–53)
Albumin: 4.2 g/dL (ref 3.5–5.2)
Alkaline Phosphatase: 59 U/L (ref 39–117)
CO2: 27 mEq/L (ref 19–32)
GFR, Est Non African American: 69 mL/min
Glucose, Bld: 144 mg/dL — ABNORMAL HIGH (ref 70–99)
Potassium: 5.6 mEq/L — ABNORMAL HIGH (ref 3.5–5.3)
Sodium: 141 mEq/L (ref 135–145)
Total Protein: 6.8 g/dL (ref 6.0–8.3)

## 2012-11-30 LAB — HEMOGLOBIN A1C
Hgb A1c MFr Bld: 5.9 % — ABNORMAL HIGH (ref <5.7)
Mean Plasma Glucose: 123 mg/dL — ABNORMAL HIGH (ref <117)

## 2012-12-01 LAB — VITAMIN D 25 HYDROXY (VIT D DEFICIENCY, FRACTURES): Vit D, 25-Hydroxy: 49 ng/mL (ref 30–89)

## 2012-12-02 ENCOUNTER — Encounter: Payer: Self-pay | Admitting: Family Medicine

## 2012-12-02 NOTE — Telephone Encounter (Signed)
O[place]ver called and left a message asking if Andrey Campanile has found out from [facility] if the psa will be covered during tomorrows cpe visit.  Best call back number is [phone]

## 2012-12-03 ENCOUNTER — Ambulatory Visit (INDEPENDENT_AMBULATORY_CARE_PROVIDER_SITE_OTHER): Payer: Medicare Other | Admitting: Family Medicine

## 2012-12-03 ENCOUNTER — Encounter: Payer: Self-pay | Admitting: Family Medicine

## 2012-12-03 VITALS — BP 146/82 | HR 56 | Temp 97.5°F | Resp 16 | Ht 68.0 in | Wt 205.0 lb

## 2012-12-03 DIAGNOSIS — Z23 Encounter for immunization: Secondary | ICD-10-CM

## 2012-12-03 DIAGNOSIS — Z0000 Encounter for general adult medical examination without abnormal findings: Secondary | ICD-10-CM

## 2012-12-03 MED ORDER — AMLODIPINE BESYLATE 5 MG PO TABS: 5.0000 mg | ORAL_TABLET | Freq: Every day | ORAL | Status: DC

## 2012-12-03 NOTE — Telephone Encounter (Signed)
This encounter was created in error - please disregard.

## 2012-12-03 NOTE — Addendum Note (Signed)
Addended by: Legrand Rams B on: 12/03/2012 12:35 PM   Modules accepted: Orders

## 2012-12-03 NOTE — Progress Notes (Signed)
Subjective:    Patient ID: Charles Bailey, male    DOB: Jul 11, 1945, 67 y.o.   MRN: [id number]  HPI  Patient is a very pleasant 67 year old white male who comes in today for complete physical exam he also has diabetes mellitus type 2 but has not been seen since 2013. At that time his HEENT A1c was 6.3. He is currently taking metformin 1000 mg by mouth twice a day and glimepiride 2 mg by mouth every morning. He frequently experiences episodes of hypoglycemia in the afternoons. His most recent labwork as listed below: Lab on 11/30/2012  Component Date Value Range Status  . WBC 11/30/2012 7.0  4.0 - 10.5 K/uL Final  . RBC 11/30/2012 4.90  4.22 - 5.81 MIL/uL Final  . Hemoglobin 11/30/2012 14.5  13.0 - 17.0 g/dL Final  . HCT 40/98/1191 44.1  39.0 - 52.0 % Final  . MCV 11/30/2012 90.0  78.0 - 100.0 fL Final  . MCH 11/30/2012 29.6  26.0 - 34.0 pg Final  . MCHC 11/30/2012 32.9  30.0 - 36.0 g/dL Final  . RDW 47/82/9562 13.3  11.5 - 15.5 % Final  . Platelets 11/30/2012 193  150 - 400 K/uL Final  . Neutrophils Relative % 11/30/2012 52  43 - 77 % Final  . Neutro Abs 11/30/2012 3.6  1.7 - 7.7 K/uL Final  . Lymphocytes Relative 11/30/2012 30  12 - 46 % Final  . Lymphs Abs 11/30/2012 2.1  0.7 - 4.0 K/uL Final  . Monocytes Relative 11/30/2012 11  3 - 12 % Final  . Monocytes Absolute 11/30/2012 0.8  0.1 - 1.0 K/uL Final  . Eosinophils Relative 11/30/2012 6* 0 - 5 % Final  . Eosinophils Absolute 11/30/2012 0.4  0.0 - 0.7 K/uL Final  . Basophils Relative 11/30/2012 1  0 - 1 % Final  . Basophils Absolute 11/30/2012 0.1  0.0 - 0.1 K/uL Final  . Smear Review 11/30/2012 Criteria for review not met   Final  . Sodium 11/30/2012 141  135 - 145 mEq/L Final  . Potassium 11/30/2012 5.6* 3.5 - 5.3 mEq/L Final   No visible hemolysis.  . Chloride 11/30/2012 102  96 - 112 mEq/L Final  . CO2 11/30/2012 27  19 - 32 mEq/L Final  . Glucose, Bld 11/30/2012 144* 70 - 99 mg/dL Final  . BUN 13/08/6576 24* 6 - 23 mg/dL  Final  . Creat 46/96/2952 1.11  0.50 - 1.35 mg/dL Final  . Total Bilirubin 11/30/2012 0.4  0.3 - 1.2 mg/dL Final  . Alkaline Phosphatase 11/30/2012 59  39 - 117 U/L Final  . AST 11/30/2012 13  0 - 37 U/L Final  . ALT 11/30/2012 11  0 - 53 U/L Final  . Total Protein 11/30/2012 6.8  6.0 - 8.3 g/dL Final  . Albumin 84/13/2440 4.2  3.5 - 5.2 g/dL Final  . Calcium 11/10/2534 9.7  8.4 - 10.5 mg/dL Final  . GFR, Est African American 11/30/2012 80   Final  . GFR, Est Non African American 11/30/2012 69   Final   Comment:                            The estimated GFR is a calculation valid for adults (>=63 years old)                          that uses the CKD-EPI algorithm to adjust for age and  sex. It is                            not to be used for children, pregnant women, hospitalized patients,                             patients on dialysis, or with rapidly changing kidney function.                          According to the NKDEP, eGFR >89 is normal, 60-89 shows mild                          impairment, 30-59 shows moderate impairment, 15-29 shows severe                          impairment and <15 is ESRD.                             Marland Kitchen Cholesterol 11/30/2012 122  0 - 200 mg/dL Final   Comment: ATP III Classification:                                < 200        mg/dL        Desirable                               200 - 239     mg/dL        Borderline High                               >= 240        mg/dL        High                             . Triglycerides 11/30/2012 173* <150 mg/dL Final  . HDL 16/10/9602 38* >39 mg/dL Final  . Total CHOL/HDL Ratio 11/30/2012 3.2   Final  . VLDL 11/30/2012 35  0 - 40 mg/dL Final  . LDL Cholesterol 11/30/2012 49  0 - 99 mg/dL Final   Comment:                            Total Cholesterol/HDL Ratio:CHD Risk                                                 Coronary Heart Disease Risk Table                                                                 Men        Women  1/2 Average Risk              3.4        3.3                                       Average Risk              5.0        4.4                                    2X Average Risk              9.6        7.1                                    3X Average Risk             23.4       11.0                          Use the calculated Patient Ratio above and the CHD Risk table                           to determine the patient's CHD Risk.                          ATP III Classification (LDL):                                < 100        mg/dL         Optimal                               100 - 129     mg/dL         Near or Above Optimal                               130 - 159     mg/dL         Borderline High                               160 - 189     mg/dL         High                                > 190        mg/dL         Very High                             . Hemoglobin A1C 11/30/2012 5.9* <5.7 % Final   Comment:  According to the ADA Clinical Practice Recommendations for 2011, when                          HbA1c is used as a screening test:                                                       >=6.5%   Diagnostic of Diabetes Mellitus                                     (if abnormal result is confirmed)                                                     5.7-6.4%   Increased risk of developing Diabetes Mellitus                                                     References:Diagnosis and Classification of Diabetes Mellitus,Diabetes                          Care,2011,34(Suppl 1):S62-S69 and Standards of Medical Care in                                  Diabetes - 2011,Diabetes Care,2011,34 (Suppl 1):S11-S61.                             . Mean Plasma Glucose 11/30/2012 123* <117 mg/dL Final  . Vit D, 16-XWRUEAV 11/30/2012 49  30 - 89 ng/mL Final   Comment:  This assay accurately quantifies Vitamin D, which is the sum of the                          25-Hydroxy forms of Vitamin D2 and D3.  Studies have shown that the                          optimum concentration of 25-Hydroxy Vitamin D is 30 ng/mL or higher.                           Concentrations of Vitamin D between 20 and 29 ng/mL are considered to                          be insufficient and concentrations less than 20 ng/mL are considered                          to be deficient for Vitamin D.    His last colonoscopy was performed in 2013 is good for 5 years due to a colon polyp. He  is due for his PSA. He is due for a pneumonia vaccine. He is due for a flu shot. He is no longer smoking. Family history significant for father who died from coronary artery disease. He has one brother with prostate cancer. He has 2 sisters. He is married with 2 children. Past Medical History  Diagnosis Date  . Hyperlipidemia   . Hypertension   . Diabetes mellitus   . Adenomatous colon polyp 2003   Past Surgical History  Procedure Laterality Date  . Abdominal aortic aneurysm repair  2007  . Inguinal hernia repair  1968    right   Current Outpatient Prescriptions on File Prior to Visit  Medication Sig Dispense Refill  . aspirin 325 MG tablet Take 325 mg by mouth daily.        . Cholecalciferol (VITAMIN D3) 1000 UNITS CAPS Take 2,000 Units by mouth daily.       . cloNIDine (CATAPRES) 0.2 MG tablet Take 1 tablet (0.2 mg total) by mouth 2 (two) times daily.  180 tablet  1  . fish oil-omega-3 fatty acids 1000 MG capsule Take 1,200 mg by mouth daily.      Marland Kitchen glimepiride (AMARYL) 2 MG tablet Take 1 tablet (2 mg total) by mouth daily before breakfast.  90 tablet  0  . lisinopril (PRINIVIL,ZESTRIL) 40 MG tablet Take 1 tablet (40 mg total) by mouth daily.  90 tablet  0  . metFORMIN (GLUCOPHAGE) 1000 MG tablet Take 1 tablet (1,000 mg total) by mouth 2 (two) times daily with a meal.  180 tablet  0  . metoprolol  (LOPRESSOR) 50 MG tablet Take 1 tablet (50 mg total) by mouth 2 (two) times daily.  120 tablet  0  . pravastatin (PRAVACHOL) 40 MG tablet Take 1 tablet (40 mg total) by mouth daily.  90 tablet  1   No current facility-administered medications on file prior to visit.   No Known Allergies History   Social History  . Marital Status: Married    Spouse Name: N/A    Number of Children: N/A  . Years of Education: N/A   Occupational History  . Not on file.   Social History Main Topics  . Smoking status: Former Smoker -- 15 years    Types: Cigarettes    Quit date: 03/03/2005  . Smokeless tobacco: Never Used  . Alcohol Use: 2.4 oz/week    4 Cans of beer per week  . Drug Use: No  . Sexual Activity: Not on file   Other Topics Concern  . Not on file   Social History Narrative  . No narrative on file     Review of Systems  All other systems reviewed and are negative.       Objective:   Physical Exam  Vitals reviewed. Constitutional: He is oriented to person, place, and time. He appears well-developed and well-nourished.  HENT:  Head: Normocephalic.  Right Ear: External ear normal.  Left Ear: External ear normal.  Nose: Nose normal.  Mouth/Throat: Oropharynx is clear and moist. No oropharyngeal exudate.  Eyes: Conjunctivae and EOM are normal. Pupils are equal, round, and reactive to light. Right eye exhibits no discharge. Left eye exhibits no discharge. No scleral icterus.  Neck: Normal range of motion. Neck supple. No JVD present. No tracheal deviation present. No thyromegaly present.  Cardiovascular: Normal rate, regular rhythm and intact distal pulses.  Exam reveals no gallop and no friction rub.   No murmur heard. Pulmonary/Chest: Effort normal and breath  sounds normal. No stridor. No respiratory distress. He has no wheezes. He has no rales. He exhibits no tenderness.  Abdominal: Soft. Bowel sounds are normal. He exhibits no distension and no mass. There is no tenderness.  There is no rebound and no guarding.  Musculoskeletal: Normal range of motion. He exhibits no edema and no tenderness.  Lymphadenopathy:    He has no cervical adenopathy.  Neurological: He is alert and oriented to person, place, and time. He has normal reflexes. He displays normal reflexes. No cranial nerve deficit. He exhibits normal muscle tone. Coordination normal.  Skin: Skin is warm. No rash noted. No erythema. No pallor.  Psychiatric: He has a normal mood and affect. His behavior is normal. Judgment and thought content normal.          Assessment & Plan:  1. Routine general medical examination at a health care facility Physical exam is normal. Give the patient Prevnar 13 today. He'll be due for Pneumovax 23 in one year. Also gave him his flu shot today. We discussed Zostavax.  I will add a PSA to his lab work. The remainder of his lab work is acceptable aside from a potassium level of 5.6. The patient has been eating a lot of bananas and agrees to cut this out of his diet. His blood pressure is slightly elevated and so I will add amlodipine 5 mg by mouth daily and recheck his blood pressure in one month. His A1c is slightly low. Given his age and his episodes of hypoglycemia I recommended discontinuing glimepiride and recheck an A1c in 6 months.

## 2012-12-06 ENCOUNTER — Encounter: Payer: Self-pay | Admitting: Family Medicine

## 2013-01-18 ENCOUNTER — Telehealth: Payer: Self-pay | Admitting: *Deleted

## 2013-01-18 MED ORDER — PRAVASTATIN SODIUM 40 MG PO TABS: 40.0000 mg | ORAL_TABLET | Freq: Every day | ORAL | Status: DC

## 2013-01-18 MED ORDER — LISINOPRIL 40 MG PO TABS: 40.0000 mg | ORAL_TABLET | Freq: Every day | ORAL | Status: DC

## 2013-01-18 MED ORDER — METOPROLOL TARTRATE 50 MG PO TABS: 50.0000 mg | ORAL_TABLET | Freq: Two times a day (BID) | ORAL | Status: DC

## 2013-01-18 MED ORDER — METFORMIN HCL 1000 MG PO TABS: 1000.0000 mg | ORAL_TABLET | Freq: Two times a day (BID) | ORAL | Status: DC

## 2013-01-18 NOTE — Telephone Encounter (Signed)
Meds refilled.

## 2013-01-22 ENCOUNTER — Telehealth: Payer: Self-pay | Admitting: Family Medicine

## 2013-01-22 MED ORDER — GLIMEPIRIDE 2 MG PO TABS: 2.0000 mg | ORAL_TABLET | Freq: Every day | ORAL | Status: DC

## 2013-01-22 MED ORDER — CLONIDINE HCL 0.2 MG PO TABS: 0.2000 mg | ORAL_TABLET | Freq: Two times a day (BID) | ORAL | Status: DC

## 2013-01-22 NOTE — Telephone Encounter (Signed)
Medication refilled per protocol. 

## 2013-02-24 ENCOUNTER — Other Ambulatory Visit: Payer: Self-pay | Admitting: Family Medicine

## 2013-02-24 MED ORDER — METOPROLOL TARTRATE 50 MG PO TABS: 50.0000 mg | ORAL_TABLET | Freq: Two times a day (BID) | ORAL | Status: DC

## 2013-02-24 NOTE — Telephone Encounter (Signed)
Rx Refilled  

## 2013-03-19 ENCOUNTER — Other Ambulatory Visit: Payer: Self-pay | Admitting: Family Medicine

## 2013-03-19 MED ORDER — LISINOPRIL 40 MG PO TABS: 40.0000 mg | ORAL_TABLET | Freq: Every day | ORAL | Status: DC

## 2013-03-19 MED ORDER — METFORMIN HCL 1000 MG PO TABS: 1000.0000 mg | ORAL_TABLET | Freq: Two times a day (BID) | ORAL | Status: DC

## 2013-03-19 NOTE — Telephone Encounter (Signed)
Rx Refilled  

## 2013-06-02 ENCOUNTER — Other Ambulatory Visit: Payer: Self-pay | Admitting: Family Medicine

## 2013-06-02 MED ORDER — PRAVASTATIN SODIUM 40 MG PO TABS: 40.0000 mg | ORAL_TABLET | Freq: Every day | ORAL | Status: DC

## 2013-06-02 MED ORDER — CLONIDINE HCL 0.2 MG PO TABS: 0.2000 mg | ORAL_TABLET | Freq: Two times a day (BID) | ORAL | Status: DC

## 2013-06-02 NOTE — Telephone Encounter (Signed)
Rx Refilled  

## 2013-07-08 ENCOUNTER — Ambulatory Visit (INDEPENDENT_AMBULATORY_CARE_PROVIDER_SITE_OTHER): Payer: Medicare Other | Admitting: Physician Assistant

## 2013-07-08 ENCOUNTER — Encounter: Payer: Self-pay | Admitting: Physician Assistant

## 2013-07-08 VITALS — BP 124/74 | HR 52 | Temp 98.0°F | Resp 18 | Wt 198.0 lb

## 2013-07-08 DIAGNOSIS — H8110 Benign paroxysmal vertigo, unspecified ear: Secondary | ICD-10-CM

## 2013-07-08 NOTE — Progress Notes (Signed)
Patient ID: JAYANTH SZCZESNIAK MRN: [id number], DOB: 11-29-1945, 68 y.o. Date of Encounter: 07/08/2013, 5:28 PM    Chief Complaint:  Chief Complaint  Patient presents with  . c/o vertigo    worse in the morning or if turns head     HPI: 68 y.o. year old white male present the symptoms above.  He says that he first had an episode of this about 2 weeks ago while at the beach. He says that one morning he turned to go into another room and at that time developed a spinning sensation and had to hold to the wall. Says that this just lasted a few seconds and then resolved.  Says that on Monday morning of this week, which was 07/05/13, he tilted his head back to drink some coffee. When he tilted his head back he felt the same symptoms. There was a spinning sensation. He then sat still for a few seconds and the symptoms resolved.  Says he has also noticed that sometimes when he walks he feels as if he is on a boat and feels a little bit staggery.  Says that over the past week he has felt as if there is a little bit of pressure/congestion in his head like he may have a little cold. However, really has not blown much out of his nose and he has not had any significant cough. Has had no fevers. Has noticed no other neurologic deficits or changes. No weakness in any extremity no slurred speech no vision changes.   Home Meds:  Outpatient Prescriptions Prior to Visit  Medication Sig Dispense Refill  . amLODipine (NORVASC) 5 MG tablet Take 1 tablet (5 mg total) by mouth daily.  90 tablet  3  . aspirin 325 MG tablet Take 325 mg by mouth daily.        . Cholecalciferol (VITAMIN D3) 1000 UNITS CAPS Take 2,000 Units by mouth daily.       . cloNIDine (CATAPRES) 0.2 MG tablet Take 1 tablet (0.2 mg total) by mouth 2 (two) times daily.  180 tablet  1  . fish oil-omega-3 fatty acids 1000 MG capsule Take 1,200 mg by mouth daily.      Marland Kitchen glimepiride (AMARYL) 2 MG tablet Take 1 tablet (2 mg total) by mouth daily  before breakfast.  90 tablet  1  . lisinopril (PRINIVIL,ZESTRIL) 40 MG tablet Take 1 tablet (40 mg total) by mouth daily.  90 tablet  0  . metFORMIN (GLUCOPHAGE) 1000 MG tablet Take 1 tablet (1,000 mg total) by mouth 2 (two) times daily with a meal.  180 tablet  0  . metoprolol (LOPRESSOR) 50 MG tablet Take 1 tablet (50 mg total) by mouth 2 (two) times daily.  180 tablet  3  . pravastatin (PRAVACHOL) 40 MG tablet Take 1 tablet (40 mg total) by mouth daily.  90 tablet  1   No facility-administered medications prior to visit.    Allergies: No Known Allergies    Review of Systems: See HPI for pertinent ROS. All other ROS negative.    Physical Exam: Blood pressure 124/74, pulse 52, temperature 98 F (36.7 C), temperature source Oral, resp. rate 18, weight 198 lb (89.812 kg)., Body mass index is 30.11 kg/(m^2). General: WNWD WM.  Appears in no acute distress. HEENT: Normocephalic, atraumatic, eyes without discharge, sclera non-icteric, nares are without discharge. Bilateral auditory canals clear, TM's are without perforation, pearly grey and translucent with reflective cone of light bilaterally. Oral cavity moist,  posterior pharynx without exudate, erythema, peritonsillar abscess. No tenderness with percussion of maxillary or frontal sinuses.  Neck: Supple. No thyromegaly. No lymphadenopathy. No carotid bruits. [place] Maneuver: Does not reproduce vertigo / symptoms. Lungs: Clear bilaterally to auscultation without wheezes, rales, or rhonchi. Breathing is unlabored. Heart: Regular rhythm. No murmurs, rubs, or gallops. Msk:  Strength and tone normal for age. Extremities/Skin: Warm and dry. Neuro: Alert and oriented X 3. Moves all extremities spontaneously. Gait is normal. CNII-XII grossly in tact. Romberg is normal. Heel to toe-- normal.  Psych:  Responds to questions appropriately with a normal affect.     ASSESSMENT AND PLAN:  68 y.o. year old male with  1. Benign paroxysmal  positional vertigo, unspecified laterality Discussed this could be secondary to viral labyrinthitis or secondary to particles in the inner ear. At this time, meclizine would be of no benefit because his symptoms currently are resolving within just a few seconds. Will hold off and on doing any ENT referral for them to do maneuvers as his symptoms have only been occurring over the last couple weeks and are not occurring on frequent basis. If he begins to develop any additional symptoms then he is to follow up immediately. If the symptoms become more severe or more frequent or not resolve over the next one to 2 weeks then he will follow up and we will consider referral to ENT or other evaluation/treatment measures.   Marin Olp [place], [place], [facility] 07/08/2013 5:28 PM

## 2013-07-19 ENCOUNTER — Other Ambulatory Visit: Payer: Self-pay | Admitting: Family Medicine

## 2013-07-19 ENCOUNTER — Encounter: Payer: Self-pay | Admitting: Family Medicine

## 2013-07-19 MED ORDER — LISINOPRIL 40 MG PO TABS: 40.0000 mg | ORAL_TABLET | Freq: Every day | ORAL | Status: DC

## 2013-07-19 NOTE — Telephone Encounter (Signed)
Med sent to pharm and letter sent to schedule appt.

## 2013-07-22 ENCOUNTER — Ambulatory Visit: Payer: Medicare Other | Admitting: Physician Assistant

## 2013-07-23 ENCOUNTER — Encounter: Payer: Self-pay | Admitting: Family Medicine

## 2013-07-23 ENCOUNTER — Ambulatory Visit (INDEPENDENT_AMBULATORY_CARE_PROVIDER_SITE_OTHER): Payer: Medicare Other | Admitting: Family Medicine

## 2013-07-23 VITALS — BP 136/82 | HR 64 | Temp 97.7°F | Resp 12 | Ht 65.5 in | Wt 196.0 lb

## 2013-07-23 DIAGNOSIS — E119 Type 2 diabetes mellitus without complications: Secondary | ICD-10-CM

## 2013-07-23 DIAGNOSIS — E785 Hyperlipidemia, unspecified: Secondary | ICD-10-CM

## 2013-07-23 DIAGNOSIS — I10 Essential (primary) hypertension: Secondary | ICD-10-CM

## 2013-07-23 DIAGNOSIS — R42 Dizziness and giddiness: Secondary | ICD-10-CM

## 2013-07-23 MED ORDER — MECLIZINE HCL 25 MG PO TABS: 25.0000 mg | ORAL_TABLET | Freq: Three times a day (TID) | ORAL | Status: DC | PRN

## 2013-07-23 MED ORDER — CARBAMIDE PEROXIDE 6.5 % OT SOLN: 5.0000 [drp] | Freq: Two times a day (BID) | OTIC | Status: DC

## 2013-07-23 NOTE — Patient Instructions (Signed)
Take meclizine We will call with lab results You may need Ear nose and Throat depending on results Increase your water intake  F/U pending

## 2013-07-23 NOTE — Progress Notes (Signed)
Patient ID: Charles Bailey, male   DOB: 12-20-1945, 68 y.o.   MRN: [id number]   Subjective:    Patient ID: Charles Bailey, male    DOB: 05-Dec-1945, 68 y.o.   MRN: [id number]  Patient presents for Dizzy Spells  patient here secondary to dizzy spells. She was seen about 2 weeks ago at that time he had 2 episodes short lived the feeling dizzy and his equilibrium was off. He also had ringing in his ears associated. This week she had another severe episode of dizzy spells along with a ringing in his ears he did not have any nausea vomiting no headache no chest pain or shortness of breath. He did have some mild tingling in his left fingers but that resolved quickly. He takes his blood pressure at home is typically 120s over 70s. He does have diabetes mellitus but he does not check his blood sugars. He was worried about his potassium is he's had difficulties with this in the past as well. He has no history of known coronary artery disease or carotid artery disease. He has not had an actual syncopal episode and no seizure activity.    Review Of Systems:  GEN- denies fatigue, fever, weight loss,weakness, recent illness HEENT- denies eye drainage, change in vision, nasal discharge, CVS- denies chest pain, palpitations RESP- denies SOB, cough, wheeze ABD- denies N/V, change in stools, abd pain GU- denies dysuria, hematuria, dribbling, incontinence MSK- denies joint pain, muscle aches, injury Neuro- denies headache,+ dizziness, syncope, seizure activity       Objective:    BP 136/82  Pulse 64  Temp(Src) 97.7 F (36.5 C) (Oral)  Resp 12  Ht 5' 5.5" (1.664 m)  Wt 196 lb (88.905 kg)  BMI 32.11 kg/m2 GEN- NAD, alert and oriented x3 HEENT- PERRL, EOMI, non injected sclera, pink conjunctiva, MMM, oropharynx clear Neck- Supple, no bruit CVS- RRR, no murmur RESP-CTAB ABD-NABS,soft,NT,ND EXT- No edema Neuro- CNII-XII in tact, no deficits Pulses- Radial 2+  Lying 136/82   Sitting 124/78   Standing  122/76       Assessment & Plan:      Problem List Items Addressed This Visit   Type 2 diabetes mellitus - Primary   Relevant Orders      Hemoglobin A1c   HTN (hypertension)   Relevant Orders      CBC with Differential      COMPLETE METABOLIC PANEL WITH GFR      LDL cholesterol, direct   Dizziness and giddiness   Relevant Medications      meclizine (ANTIVERT) tablet    Other Visit Diagnoses   Hyperlipidemia        Relevant Orders       LDL cholesterol, direct       Note: This dictation was prepared with Dragon dictation along with smaller phrase technology. Any transcriptional errors that result from this process are unintentional.    .

## 2013-07-24 LAB — CBC WITH DIFFERENTIAL/PLATELET
BASOS ABS: 0 10*3/uL (ref 0.0–0.1)
Basophils Relative: 0 % (ref 0–1)
EOS PCT: 3 % (ref 0–5)
Eosinophils Absolute: 0.2 10*3/uL (ref 0.0–0.7)
HEMATOCRIT: 42.7 % (ref 39.0–52.0)
HEMOGLOBIN: 14.4 g/dL (ref 13.0–17.0)
LYMPHS PCT: 25 % (ref 12–46)
Lymphs Abs: 2 10*3/uL (ref 0.7–4.0)
MCH: 29.6 pg (ref 26.0–34.0)
MCHC: 33.7 g/dL (ref 30.0–36.0)
MCV: 87.7 fL (ref 78.0–100.0)
MONO ABS: 0.7 10*3/uL (ref 0.1–1.0)
MONOS PCT: 9 % (ref 3–12)
Neutro Abs: 5.1 10*3/uL (ref 1.7–7.7)
Neutrophils Relative %: 63 % (ref 43–77)
Platelets: 249 10*3/uL (ref 150–400)
RBC: 4.87 MIL/uL (ref 4.22–5.81)
RDW: 13.6 % (ref 11.5–15.5)
WBC: 8.1 10*3/uL (ref 4.0–10.5)

## 2013-07-24 LAB — COMPLETE METABOLIC PANEL WITH GFR
ALBUMIN: 4.3 g/dL (ref 3.5–5.2)
ALT: 11 U/L (ref 0–53)
AST: 13 U/L (ref 0–37)
Alkaline Phosphatase: 53 U/L (ref 39–117)
BUN: 22 mg/dL (ref 6–23)
CALCIUM: 9.6 mg/dL (ref 8.4–10.5)
CHLORIDE: 101 meq/L (ref 96–112)
CO2: 25 meq/L (ref 19–32)
CREATININE: 1.03 mg/dL (ref 0.50–1.35)
GFR, EST AFRICAN AMERICAN: 87 mL/min
GFR, EST NON AFRICAN AMERICAN: 75 mL/min
GLUCOSE: 203 mg/dL — AB (ref 70–99)
Potassium: 4.8 mEq/L (ref 3.5–5.3)
Sodium: 137 mEq/L (ref 135–145)
Total Bilirubin: 0.5 mg/dL (ref 0.2–1.2)
Total Protein: 6.8 g/dL (ref 6.0–8.3)

## 2013-07-24 LAB — HEMOGLOBIN A1C
Hgb A1c MFr Bld: 6.7 % — ABNORMAL HIGH (ref <5.7)
Mean Plasma Glucose: 146 mg/dL — ABNORMAL HIGH (ref <117)

## 2013-07-24 LAB — LDL CHOLESTEROL, DIRECT: LDL DIRECT: 50 mg/dL

## 2013-07-24 NOTE — Assessment & Plan Note (Signed)
Check labs, does not check CBG at home

## 2013-07-24 NOTE — Assessment & Plan Note (Signed)
Will check electrolytes, check A1C make sure diabetes is controlled, no sign of orthostasis, neurologically in tact Will give Meclizine for vertigo to see if this helps Possible menieres or eustachian tube dysfunction/Inner Ear, if labs are normal refer to ENT

## 2013-07-24 NOTE — Assessment & Plan Note (Signed)
Well controlled 

## 2013-07-26 ENCOUNTER — Ambulatory Visit: Payer: Medicare Other | Admitting: Family Medicine

## 2013-07-26 DIAGNOSIS — H6121 Impacted cerumen, right ear: Secondary | ICD-10-CM

## 2013-07-26 NOTE — Progress Notes (Signed)
Patient ID: Charles Bailey, male   DOB: 15-Apr-1945, 68 y.o.   MRN: [id number]  Patient seen in clinic today to F/U Debrox Ear drops.   R ear irrigated.   Tolerated procedure well.

## 2013-08-10 ENCOUNTER — Other Ambulatory Visit: Payer: Self-pay | Admitting: Family Medicine

## 2013-08-10 MED ORDER — METFORMIN HCL 1000 MG PO TABS: 1000.0000 mg | ORAL_TABLET | Freq: Two times a day (BID) | ORAL | Status: DC

## 2013-08-10 NOTE — Telephone Encounter (Signed)
Med sent to pharm 

## 2013-08-13 ENCOUNTER — Telehealth: Payer: Self-pay | Admitting: Family Medicine

## 2013-08-13 ENCOUNTER — Other Ambulatory Visit: Payer: Self-pay | Admitting: Family Medicine

## 2013-08-13 MED ORDER — METFORMIN HCL 1000 MG PO TABS
1000.0000 mg | ORAL_TABLET | Freq: Two times a day (BID) | ORAL | Status: DC
Start: 2013-08-13 — End: 2013-12-23

## 2013-08-13 NOTE — Telephone Encounter (Signed)
Patient says we sent his [facility] to [facility] instead of [facility] please call him back and let him know that we send it to them instead  [phone]

## 2013-08-13 NOTE — Telephone Encounter (Signed)
Pt aware.

## 2013-08-13 NOTE — Telephone Encounter (Signed)
Med sent to correct pharm.

## 2013-09-07 ENCOUNTER — Other Ambulatory Visit: Payer: Self-pay | Admitting: *Deleted

## 2013-09-07 MED ORDER — AMLODIPINE BESYLATE 5 MG PO TABS: 5.0000 mg | ORAL_TABLET | Freq: Every day | ORAL | Status: DC

## 2013-09-07 NOTE — Telephone Encounter (Signed)
Received fax requesting refill on Amlodipine.   Refill appropriate and filled per protocol. 

## 2013-09-11 ENCOUNTER — Other Ambulatory Visit: Payer: Self-pay | Admitting: *Deleted

## 2013-09-11 MED ORDER — LISINOPRIL 40 MG PO TABS: 40.0000 mg | ORAL_TABLET | Freq: Every day | ORAL | Status: DC

## 2013-09-11 NOTE — Telephone Encounter (Signed)
Received fax requesting refill on Lisinopril.   Refill appropriate and filled per protocol. 

## 2013-10-22 ENCOUNTER — Encounter: Payer: Self-pay | Admitting: Internal Medicine

## 2013-11-03 ENCOUNTER — Telehealth: Payer: Self-pay | Admitting: Family Medicine

## 2013-11-03 MED ORDER — CLONIDINE HCL 0.2 MG PO TABS: 0.2000 mg | ORAL_TABLET | Freq: Two times a day (BID) | ORAL | Status: DC

## 2013-11-03 NOTE — Telephone Encounter (Signed)
Med sent to pharm 

## 2013-11-24 ENCOUNTER — Other Ambulatory Visit: Payer: Self-pay | Admitting: Family Medicine

## 2013-11-24 ENCOUNTER — Telehealth: Payer: Self-pay | Admitting: *Deleted

## 2013-11-24 DIAGNOSIS — Z0100 Encounter for examination of eyes and vision without abnormal findings: Principal | ICD-10-CM

## 2013-11-24 DIAGNOSIS — E119 Type 2 diabetes mellitus without complications: Secondary | ICD-10-CM

## 2013-11-24 NOTE — Telephone Encounter (Signed)
Pt insurance called stating that pt is needing a referral to have colonoscopy and diabetic eye exam along with a CPE, I spoke with pt and he states that he believes he has had colonoscopy within the last few years, I looked it up and he had one in 2013, pt just wants referral to Dr. Gershon Crane for Diabetic eye exam which has been placed and is scheduled for CPE in Dec 2015.

## 2013-12-20 LAB — HM DIABETES EYE EXAM

## 2013-12-21 ENCOUNTER — Other Ambulatory Visit: Payer: Medicare Other

## 2013-12-21 DIAGNOSIS — I10 Essential (primary) hypertension: Secondary | ICD-10-CM

## 2013-12-21 DIAGNOSIS — E669 Obesity, unspecified: Secondary | ICD-10-CM

## 2013-12-21 DIAGNOSIS — E119 Type 2 diabetes mellitus without complications: Secondary | ICD-10-CM

## 2013-12-21 DIAGNOSIS — Z0000 Encounter for general adult medical examination without abnormal findings: Secondary | ICD-10-CM

## 2013-12-21 DIAGNOSIS — Z79899 Other long term (current) drug therapy: Secondary | ICD-10-CM

## 2013-12-21 LAB — CBC WITH DIFFERENTIAL/PLATELET
Basophils Absolute: 0 10*3/uL (ref 0.0–0.1)
Basophils Relative: 0 % (ref 0–1)
EOS PCT: 5 % (ref 0–5)
Eosinophils Absolute: 0.3 10*3/uL (ref 0.0–0.7)
HEMATOCRIT: 43.3 % (ref 39.0–52.0)
HEMOGLOBIN: 14.5 g/dL (ref 13.0–17.0)
LYMPHS ABS: 1.8 10*3/uL (ref 0.7–4.0)
LYMPHS PCT: 27 % (ref 12–46)
MCH: 29.4 pg (ref 26.0–34.0)
MCHC: 33.5 g/dL (ref 30.0–36.0)
MCV: 87.7 fL (ref 78.0–100.0)
MONO ABS: 0.7 10*3/uL (ref 0.1–1.0)
MONOS PCT: 10 % (ref 3–12)
MPV: 10.6 fL (ref 9.4–12.4)
Neutro Abs: 3.9 10*3/uL (ref 1.7–7.7)
Neutrophils Relative %: 58 % (ref 43–77)
Platelets: 199 10*3/uL (ref 150–400)
RBC: 4.94 MIL/uL (ref 4.22–5.81)
RDW: 13.8 % (ref 11.5–15.5)
WBC: 6.8 10*3/uL (ref 4.0–10.5)

## 2013-12-21 LAB — COMPLETE METABOLIC PANEL WITH GFR
ALK PHOS: 56 U/L (ref 39–117)
ALT: 12 U/L (ref 0–53)
AST: 13 U/L (ref 0–37)
Albumin: 3.9 g/dL (ref 3.5–5.2)
BUN: 13 mg/dL (ref 6–23)
CALCIUM: 9.3 mg/dL (ref 8.4–10.5)
CHLORIDE: 102 meq/L (ref 96–112)
CO2: 28 mEq/L (ref 19–32)
Creat: 0.99 mg/dL (ref 0.50–1.35)
GFR, Est African American: >89 mL/min
GFR, Est Non African American: 79 mL/min
Glucose, Bld: 133 mg/dL — ABNORMAL HIGH (ref 70–99)
Potassium: 4.8 mEq/L (ref 3.5–5.3)
SODIUM: 139 meq/L (ref 135–145)
TOTAL PROTEIN: 6.8 g/dL (ref 6.0–8.3)
Total Bilirubin: 0.4 mg/dL (ref 0.2–1.2)

## 2013-12-21 LAB — LIPID PANEL
CHOLESTEROL: 107 mg/dL (ref 0–200)
HDL: 37 mg/dL — ABNORMAL LOW (ref >39)
LDL Cholesterol: 48 mg/dL (ref 0–99)
TRIGLYCERIDES: 110 mg/dL (ref <150)
Total CHOL/HDL Ratio: 2.9 Ratio
VLDL: 22 mg/dL (ref 0–40)

## 2013-12-21 LAB — HEMOGLOBIN A1C
Hgb A1c MFr Bld: 7.1 % — ABNORMAL HIGH (ref <5.7)
Mean Plasma Glucose: 157 mg/dL — ABNORMAL HIGH (ref <117)

## 2013-12-23 ENCOUNTER — Other Ambulatory Visit: Payer: Self-pay | Admitting: Family Medicine

## 2013-12-23 ENCOUNTER — Encounter: Payer: Self-pay | Admitting: Family Medicine

## 2013-12-23 ENCOUNTER — Ambulatory Visit (INDEPENDENT_AMBULATORY_CARE_PROVIDER_SITE_OTHER): Payer: Medicare Other | Admitting: Family Medicine

## 2013-12-23 VITALS — BP 130/68 | HR 62 | Temp 97.9°F | Resp 16 | Ht 66.0 in | Wt 196.0 lb

## 2013-12-23 DIAGNOSIS — Z23 Encounter for immunization: Secondary | ICD-10-CM | POA: Diagnosis not present

## 2013-12-23 DIAGNOSIS — Z0000 Encounter for general adult medical examination without abnormal findings: Secondary | ICD-10-CM

## 2013-12-23 DIAGNOSIS — I4891 Unspecified atrial fibrillation: Secondary | ICD-10-CM

## 2013-12-23 DIAGNOSIS — R009 Unspecified abnormalities of heart beat: Secondary | ICD-10-CM

## 2013-12-23 DIAGNOSIS — E119 Type 2 diabetes mellitus without complications: Secondary | ICD-10-CM | POA: Diagnosis not present

## 2013-12-23 LAB — TSH: TSH: 1.49 u[IU]/mL (ref 0.350–4.500)

## 2013-12-23 MED ORDER — PRAVASTATIN SODIUM 40 MG PO TABS: 40.0000 mg | ORAL_TABLET | Freq: Every day | ORAL | Status: DC

## 2013-12-23 MED ORDER — AMLODIPINE BESYLATE 5 MG PO TABS: 5.0000 mg | ORAL_TABLET | Freq: Every day | ORAL | Status: DC

## 2013-12-23 MED ORDER — LISINOPRIL 40 MG PO TABS: 40.0000 mg | ORAL_TABLET | Freq: Every day | ORAL | Status: DC

## 2013-12-23 MED ORDER — CLONIDINE HCL 0.2 MG PO TABS: 0.2000 mg | ORAL_TABLET | Freq: Two times a day (BID) | ORAL | Status: DC

## 2013-12-23 MED ORDER — METOPROLOL TARTRATE 50 MG PO TABS: 50.0000 mg | ORAL_TABLET | Freq: Two times a day (BID) | ORAL | Status: DC

## 2013-12-23 MED ORDER — METFORMIN HCL 1000 MG PO TABS: 1000.0000 mg | ORAL_TABLET | Freq: Two times a day (BID) | ORAL | Status: DC

## 2013-12-23 NOTE — Progress Notes (Signed)
Subjective:    Patient ID: Charles Bailey, male    DOB: April 04, 1945, 68 y.o.   MRN: [id number]  HPI Patient is a very pleasant 68 year old white male who is here today for complete physical exam. He has a past medical history significant for diabetes mellitus type 2, a AAA. He is overdue for Pneumovax 23 as well as a flu shot. His diabetic eye exam was performed last week. His diabetic foot exam was performed today. He is taking an aspirin every day. His blood pressures well controlled 130/68. Unfortunately his hemoglobin A1c has risen from 6.7-7.1. His colonoscopy was performed in 2013. He is not due for repeat colonoscopy until 2018 due to a polyp. He is due for a PSA. On examination today his heart rate sounds slightly irregular. It is difficult to ascertain whether it is a PVC or an irregular heartbeat and therefore I'll perform an EKG. He also has a suspicious lesion on his left forearm. I recommended he schedule a time for an excisional biopsy Past Medical History  Diagnosis Date  . Hyperlipidemia   . Hypertension   . Diabetes mellitus   . Adenomatous colon polyp 2003   Past Surgical History  Procedure Laterality Date  . Abdominal aortic aneurysm repair  2007  . Inguinal hernia repair  1968    right   Current Outpatient Prescriptions on File Prior to Visit  Medication Sig Dispense Refill  . amLODipine (NORVASC) 5 MG tablet Take 1 tablet (5 mg total) by mouth daily. 90 tablet 3  . aspirin 325 MG tablet Take 325 mg by mouth daily.      . Cholecalciferol (VITAMIN D3) 1000 UNITS CAPS Take 2,000 Units by mouth daily.     . cloNIDine (CATAPRES) 0.2 MG tablet Take 1 tablet (0.2 mg total) by mouth 2 (two) times daily. 180 tablet 1  . fish oil-omega-3 fatty acids 1000 MG capsule Take 1,200 mg by mouth daily.    Marland Kitchen lisinopril (PRINIVIL,ZESTRIL) 40 MG tablet Take 1 tablet (40 mg total) by mouth daily. 90 tablet 3  . metFORMIN (GLUCOPHAGE) 1000 MG tablet Take 1 tablet (1,000 mg total) by mouth 2  (two) times daily with a meal. 180 tablet 2  . metoprolol (LOPRESSOR) 50 MG tablet Take 1 tablet (50 mg total) by mouth 2 (two) times daily. 180 tablet 3  . pravastatin (PRAVACHOL) 40 MG tablet Take 1 tablet (40 mg total) by mouth daily. 90 tablet 1  . meclizine (ANTIVERT) 25 MG tablet Take 1 tablet (25 mg total) by mouth 3 (three) times daily as needed for dizziness. (Patient not taking: Reported on 12/23/2013) 30 tablet 1   No current facility-administered medications on file prior to visit.   No Known Allergies History   Social History  . Marital Status: Married    Spouse Name: N/A    Number of Children: N/A  . Years of Education: N/A   Occupational History  . Not on file.   Social History Main Topics  . Smoking status: Former Smoker -- 15 years    Types: Cigarettes    Quit date: 03/03/2005  . Smokeless tobacco: Never Used  . Alcohol Use: 2.4 oz/week    4 Cans of beer per week  . Drug Use: No  . Sexual Activity: Not on file   Other Topics Concern  . Not on file   Social History Narrative   Family History  Problem Relation Age of Onset  . Heart disease Father   . Diabetes  Paternal Aunt   . Heart disease Paternal Uncle   . Heart disease Paternal Grandfather       Review of Systems  All other systems reviewed and are negative.      Objective:   Physical Exam  Constitutional: He is oriented to person, place, and time. He appears well-developed and well-nourished. No distress.  HENT:  Head: Normocephalic and atraumatic.  Right Ear: External ear normal.  Left Ear: External ear normal.  Nose: Nose normal.  Mouth/Throat: Oropharynx is clear and moist. No oropharyngeal exudate.  Eyes: Conjunctivae and EOM are normal. Pupils are equal, round, and reactive to light. Right eye exhibits no discharge. Left eye exhibits no discharge. No scleral icterus.  Neck: Normal range of motion. Neck supple. No JVD present. No tracheal deviation present. No thyromegaly present.    Cardiovascular: Normal rate, normal heart sounds and intact distal pulses.  An irregular rhythm present. Exam reveals no gallop and no friction rub.   No murmur heard. Pulmonary/Chest: Effort normal and breath sounds normal. No stridor. No respiratory distress. He has no wheezes. He has no rales. He exhibits no tenderness.  Abdominal: Soft. Bowel sounds are normal. He exhibits no distension and no mass. There is no tenderness. There is no rebound and no guarding.  Musculoskeletal: Normal range of motion. He exhibits no edema or tenderness.  Lymphadenopathy:    He has no cervical adenopathy.  Neurological: He is alert and oriented to person, place, and time. He has normal reflexes. He displays normal reflexes. No cranial nerve deficit. He exhibits normal muscle tone. Coordination normal.  Skin: Skin is warm. No rash noted. He is not diaphoretic. No erythema. No pallor.  Psychiatric: He has a normal mood and affect. His behavior is normal. Judgment and thought content normal.  Vitals reviewed.         Assessment & Plan:  Diabetes mellitus type II, controlled - Plan: Microalbumin, urine  Abnormal heart rate - Plan: EKG 12-Lead  Routine general medical examination at a health care facility   Patient's physical exam is significant for an abnormal lesion on his left forearm. I have recommended an excisional biopsy. Patient also has an irregular heart rate. I will obtain an EKG. Patient's most recent lab work as listed below: Lab on 12/21/2013  Component Date Value Ref Range Status  . Cholesterol 12/21/2013 107  0 - 200 mg/dL Final   Comment: ATP III Classification:       < 200        mg/dL        Desirable      200 - 239     mg/dL        Borderline High      >= 240        mg/dL        High     . Triglycerides 12/21/2013 110  <150 mg/dL Final  . HDL 12/21/2013 37* >39 mg/dL Final  . Total CHOL/HDL Ratio 12/21/2013 2.9   Final  . VLDL 12/21/2013 22  0 - 40 mg/dL Final  . LDL  Cholesterol 12/21/2013 48  0 - 99 mg/dL Final   Comment:   Total Cholesterol/HDL Ratio:CHD Risk                        Coronary Heart Disease Risk Table  Men       Women          1/2 Average Risk              3.4        3.3              Average Risk              5.0        4.4           2X Average Risk              9.6        7.1           3X Average Risk             23.4       11.0 Use the calculated Patient Ratio above and the CHD Risk table  to determine the patient's CHD Risk. ATP III Classification (LDL):       < 100        mg/dL         Optimal      100 - 129     mg/dL         Near or Above Optimal      130 - 159     mg/dL         Borderline High      160 - 189     mg/dL         High       > 190        mg/dL         Very High     . WBC 12/21/2013 6.8  4.0 - 10.5 K/uL Final  . RBC 12/21/2013 4.94  4.22 - 5.81 MIL/uL Final  . Hemoglobin 12/21/2013 14.5  13.0 - 17.0 g/dL Final  . HCT 12/21/2013 43.3  39.0 - 52.0 % Final  . MCV 12/21/2013 87.7  78.0 - 100.0 fL Final  . MCH 12/21/2013 29.4  26.0 - 34.0 pg Final  . MCHC 12/21/2013 33.5  30.0 - 36.0 g/dL Final  . RDW 12/21/2013 13.8  11.5 - 15.5 % Final  . Platelets 12/21/2013 199  150 - 400 K/uL Final  . MPV 12/21/2013 10.6  9.4 - 12.4 fL Final  . Neutrophils Relative % 12/21/2013 58  43 - 77 % Final  . Neutro Abs 12/21/2013 3.9  1.7 - 7.7 K/uL Final  . Lymphocytes Relative 12/21/2013 27  12 - 46 % Final  . Lymphs Abs 12/21/2013 1.8  0.7 - 4.0 K/uL Final  . Monocytes Relative 12/21/2013 10  3 - 12 % Final  . Monocytes Absolute 12/21/2013 0.7  0.1 - 1.0 K/uL Final  . Eosinophils Relative 12/21/2013 5  0 - 5 % Final  . Eosinophils Absolute 12/21/2013 0.3  0.0 - 0.7 K/uL Final  . Basophils Relative 12/21/2013 0  0 - 1 % Final  . Basophils Absolute 12/21/2013 0.0  0.0 - 0.1 K/uL Final  . Smear Review 12/21/2013 Criteria for review not met   Final  . Hgb A1c MFr Bld 12/21/2013 7.1* <5.7 % Final    Comment:  According to the ADA Clinical Practice Recommendations for 2011, when HbA1c is used as a screening test:     >=6.5%   Diagnostic of Diabetes Mellitus            (if abnormal result is confirmed)   5.7-6.4%   Increased risk of developing Diabetes Mellitus   References:Diagnosis and Classification of Diabetes Mellitus,Diabetes [id number],6599,35(TSVXB 1):S62-S69 and Standards of Medical Care in         Diabetes - 2011,Diabetes [id number],3009,23 (Suppl 1):S11-S61.     . Mean Plasma Glucose 12/21/2013 157* <117 mg/dL Final  . Sodium 12/21/2013 139  135 - 145 mEq/L Final  . Potassium 12/21/2013 4.8  3.5 - 5.3 mEq/L Final  . Chloride 12/21/2013 102  96 - 112 mEq/L Final  . CO2 12/21/2013 28  19 - 32 mEq/L Final  . Glucose, Bld 12/21/2013 133* 70 - 99 mg/dL Final  . BUN 12/21/2013 13  6 - 23 mg/dL Final  . Creat 12/21/2013 0.99  0.50 - 1.35 mg/dL Final  . Total Bilirubin 12/21/2013 0.4  0.2 - 1.2 mg/dL Final  . Alkaline Phosphatase 12/21/2013 56  39 - 117 U/L Final  . AST 12/21/2013 13  0 - 37 U/L Final  . ALT 12/21/2013 12  0 - 53 U/L Final  . Total Protein 12/21/2013 6.8  6.0 - 8.3 g/dL Final  . Albumin 12/21/2013 3.9  3.5 - 5.2 g/dL Final  . Calcium 12/21/2013 9.3  8.4 - 10.5 mg/dL Final  . GFR, Est African American 12/21/2013 >89   Final  . GFR, Est Non African American 12/21/2013 79   Final   Comment:   The estimated GFR is a calculation valid for adults (>=36 years old) that uses the CKD-EPI algorithm to adjust for age and sex. It is   not to be used for children, pregnant women, hospitalized patients,    patients on dialysis, or with rapidly changing kidney function. According to the NKDEP, eGFR >89 is normal, 60-89 shows mild impairment, 30-59 shows moderate impairment, 15-29 shows severe impairment and <15 is ESRD.      Patient's cholesterol is acceptable. His HDL is low and his hemoglobin A1c is high.  I have recommended that the patient begin aerobic exercise 20-30 minutes every day for 5 days a week. I would like to recheck his lab work in 6 months. If his hemoglobin A1c remains elevated at that time, I will start the patient on Januvia. The patient received his flu shot today as well as Pneumovax 23. I will add a PSA to his lab work to screen for prostate cancer. His colon cancer screening is up-to-date. His diabetic eye exam and diabetic foot exam are up-to-date. Patient's EKG reveals atrial fibrillation with nonspecific ST changes in the lateral leads. I have samples of Pradaxa 150 mg pobid.  I've asked the patient to discontinue aspirin. I will schedule the patient see a cardiologist for an echocardiogram. I will also add a TSH to his lab work to screen for thyroid problems as a potential trigger

## 2013-12-24 ENCOUNTER — Other Ambulatory Visit: Payer: Self-pay | Admitting: *Deleted

## 2013-12-24 LAB — PSA: PSA: 2.99 ng/mL (ref <=4.00)

## 2013-12-24 LAB — MICROALBUMIN, URINE: Microalb, Ur: 8.6 mg/dL — ABNORMAL HIGH (ref <2.0)

## 2013-12-24 MED ORDER — DABIGATRAN ETEXILATE MESYLATE 150 MG PO CAPS: 150.0000 mg | ORAL_CAPSULE | Freq: Two times a day (BID) | ORAL | Status: DC

## 2013-12-24 NOTE — Telephone Encounter (Signed)
Patient requested to have all meds faxed to [facility].

## 2013-12-28 ENCOUNTER — Telehealth: Payer: Self-pay | Admitting: *Deleted

## 2013-12-28 NOTE — Telephone Encounter (Signed)
Pt called checking on his referral to Cardiologist which is in process of being scheduled at [facility] per receptionist at [facility] and pt is aware, also, pt wants to let you know that his pradaxa is going to cost him 285.00 for a 90 day supply, and just wants to wait until he sees the Cards before he starts that medication.

## 2013-12-30 MED ORDER — WARFARIN SODIUM 5 MG PO TABS: ORAL_TABLET | ORAL | Status: DC

## 2013-12-30 NOTE — Telephone Encounter (Signed)
I would not wait.  Would he want to start coumadin instead.  If so, 10 mg pox 1 today and then 5 mg poqday thereafter and recheck inr in 5 days.

## 2013-12-30 NOTE — Telephone Encounter (Signed)
Pt aware, med sent to pharm and appt made for 01/04/14

## 2014-01-04 ENCOUNTER — Encounter: Payer: Self-pay | Admitting: Family Medicine

## 2014-01-04 ENCOUNTER — Telehealth: Payer: Self-pay | Admitting: Family Medicine

## 2014-01-04 MED ORDER — DABIGATRAN ETEXILATE MESYLATE 150 MG PO CAPS: 150.0000 mg | ORAL_CAPSULE | Freq: Two times a day (BID) | ORAL | Status: DC

## 2014-01-04 NOTE — Telephone Encounter (Signed)
Pt called, wanted to know why he still needs to be seen today because he has not started any of the medications you have recommended (Prodaxa or Warfarin).  I did not see appt for today in epic??  He wants to know what you want him to do.  He was concerned about side effects he has heard about on the medications.  I did try to discuss with him that no medication is side effect free.  He thought if he was on Warfarin he would need to come here A LOT for blood work.  Again I tried to tell him, once adjusted to maintenance dose does not need to be seen except for every 4-6 weeks. Told him with his irregular heart rate he is in increased risk of developing a clot that can cause heart attack and/or stroke. Then he said why not start on one then change to the other??  Told him I would refer to you for advise.

## 2014-01-04 NOTE — Telephone Encounter (Signed)
Spoke to him again with your recommendations.  Has decided to go with the Pradaxa.  Rx sent to mail order (less expensive) for them to fill.  Told him to follow up with you in three months and keep appt with Cardiloogy next month

## 2014-01-04 NOTE — Telephone Encounter (Signed)
He can either pay more for the pradaxa OR pay less for the warfarin but have to come in every 4 weeks for labs.  He couldn't afford pradaxa and therefore we switched him to warfarin.  Start the warfarin as directed and check INR in 5 days.  I don't understand his confusion.  If he is still confused, have him come in to see me at appt to discuss.

## 2014-01-19 NOTE — Progress Notes (Signed)
Patient ID: Charles Bailey, male   DOB: [birth year], 69 y.o.   MRN: [id number]    69 y.o. [facility] History of DM, elevated lipids , HTN and vascular disease with AAA repair in 2007 No documented CAD But never had stress test.  Obese and sedentary poor functional capacity.  Thinks exertional dyspnea worse over summer. Does not note palpitations and no idea When he might have gone into afib.  2007 presented with AAA rupture and had aortobifem.  No f/u with Dr Kellie Simmering recently  Started on Pradaxa by Dr Dennard Schaumann  12/10 Labs reviewed and BMET, CBC ok No bleeding issues.  On beta blocker for rate control     ROS: Denies fever, malais, weight loss, blurry vision, decreased visual acuity, cough, sputum, SOB, hemoptysis, pleuritic pain, palpitaitons, heartburn, abdominal pain, melena, lower extremity edema, claudication, or rash.  All other systems reviewed and negative   General: Affect appropriate Obese white male  HEENT: normal Neck supple with no adenopathy JVP normal no bruits no thyromegaly Lungs clear with no wheezing and good diaphragmatic motion Heart:  S1/S2 no murmur,rub, gallop or click PMI normal Abdomen: benighn, BS positve, no tenderness, Aorto bifem  no bruit.  No HSM or HJR Distal pulses intact with no bruits No edema Neuro non-focal Skin warm and dry No muscular weakness  Medications Current Outpatient Prescriptions  Medication Sig Dispense Refill  . amLODipine (NORVASC) 5 MG tablet Take 1 tablet (5 mg total) by mouth daily. 90 tablet 3  . aspirin 325 MG tablet Take 325 mg by mouth daily.      . Cholecalciferol (VITAMIN D3) 1000 UNITS CAPS Take 2,000 Units by mouth daily.     . cloNIDine (CATAPRES) 0.2 MG tablet Take 1 tablet (0.2 mg total) by mouth 2 (two) times daily. 180 tablet 3  . dabigatran (PRADAXA) 150 MG CAPS capsule Take 1 capsule (150 mg total) by mouth 2 (two) times daily. 180 capsule 3  . fish oil-omega-3 fatty acids  1000 MG capsule Take 1,200 mg by mouth daily.    Marland Kitchen lisinopril (PRINIVIL,ZESTRIL) 40 MG tablet Take 1 tablet (40 mg total) by mouth daily. 90 tablet 3  . meclizine (ANTIVERT) 25 MG tablet Take 1 tablet (25 mg total) by mouth 3 (three) times daily as needed for dizziness. (Patient not taking: Reported on 12/23/2013) 30 tablet 1  . metFORMIN (GLUCOPHAGE) 1000 MG tablet Take 1 tablet (1,000 mg total) by mouth 2 (two) times daily with a meal. 180 tablet 3  . metoprolol (LOPRESSOR) 50 MG tablet Take 1 tablet (50 mg total) by mouth 2 (two) times daily. 180 tablet 3  . pravastatin (PRAVACHOL) 40 MG tablet Take 1 tablet (40 mg total) by mouth daily. 90 tablet 3   No current facility-administered medications for this visit.    Allergies Review of patient's allergies indicates no known allergies.  Family History: Family History  Problem Relation Age of Onset  . Heart disease Father   . Diabetes Paternal Aunt   . Heart disease Paternal Uncle   . Heart disease Paternal Grandfather     Social History: History   Social History  . Marital Status: Married    Spouse Name: N/A    Number of Children: N/A  . Years of Education: N/A   Occupational History  . Not on file.   Social History Main Topics  . Smoking status: Former Smoker -- 15 years    Types: Cigarettes  Quit date: 03/03/2005  . Smokeless tobacco: Never Used  . Alcohol Use: 2.4 oz/week    4 Cans of beer per week  . Drug Use: No  . Sexual Activity: Not on file   Other Topics Concern  . Not on file   Social History Narrative    Past Surgical History  Procedure Laterality Date  . Abdominal aortic aneurysm repair  2007  . Inguinal hernia repair  1968    right    Past Medical History  Diagnosis Date  . Hyperlipidemia   . Hypertension   . Diabetes mellitus   . Adenomatous colon polyp 2003    Electrocardiogram:  12/23/13  afib rate 55 low voltage diffuse nonspecific ST/T wave changes   Assessment and Plan

## 2014-01-20 ENCOUNTER — Encounter: Payer: Self-pay | Admitting: Cardiovascular Disease

## 2014-01-20 ENCOUNTER — Ambulatory Visit (INDEPENDENT_AMBULATORY_CARE_PROVIDER_SITE_OTHER): Payer: PPO | Admitting: Cardiovascular Disease

## 2014-01-20 DIAGNOSIS — Z9889 Other specified postprocedural states: Secondary | ICD-10-CM

## 2014-01-20 DIAGNOSIS — Z8679 Personal history of other diseases of the circulatory system: Secondary | ICD-10-CM

## 2014-01-20 DIAGNOSIS — E139 Other specified diabetes mellitus without complications: Secondary | ICD-10-CM

## 2014-01-20 DIAGNOSIS — I4891 Unspecified atrial fibrillation: Secondary | ICD-10-CM

## 2014-01-20 NOTE — Assessment & Plan Note (Signed)
Well controlled.  Continue current medications and low sodium Dash type diet.    

## 2014-01-20 NOTE — Assessment & Plan Note (Signed)
Continue pradaxa and metoprolol  Latter dose may need to be decreased  Echo to assess EF and atrial sizes.  Needs stress myovue to r/o CAD in light of DM, HTN, previousl smoking and vascular disease with ruptured AAA.  This patients CHA2DS2-VASc Score  4 and unadjusted Ischemic Stroke Rate (% per year) is equal to 4.8 % stroke rate/year from a score of 4  Above score calculated as 1 point each if present [CHF, HTN, DM, Vascular=MI/PAD/Aortic Plaque, Age if 65-74, or Male] Above score calculated as 2 points each if present [Age > 75, or Stroke/TIA/TE]

## 2014-01-20 NOTE — Assessment & Plan Note (Signed)
Obesity precludes adequate exam  F/U CTA abdomen with contrast to see if repair holding up No claudication

## 2014-01-20 NOTE — Patient Instructions (Addendum)
Your physician recommends that you schedule a follow-up appointment in: [place]   Your physician recommends that you continue on your current medications as directed. Please refer to the Current Medication list given to you today.  Your physician has requested that you have an echocardiogram. Echocardiography is a painless test that uses sound waves to create images of your heart. It provides your doctor with information about the size and shape of your heart and how well your heart's chambers and valves are working. This procedure takes approximately one hour. There are no restrictions for this procedure.    Your physician has requested that you have en exercise stress myoview. For further information please visit [email or web address]. Please follow instruction sheet, as given.    ABD  CT

## 2014-01-20 NOTE — Addendum Note (Signed)
Addended by: Devra Dopp E on: 01/20/2014 09:03 AM   Modules accepted: Orders, Medications

## 2014-01-25 ENCOUNTER — Ambulatory Visit (INDEPENDENT_AMBULATORY_CARE_PROVIDER_SITE_OTHER)
Admission: RE | Admit: 2014-01-25 | Discharge: 2014-01-25 | Disposition: A | Discharge status: Home / Self Care | Payer: PPO | Source: Ambulatory Visit | Attending: Cardiovascular Disease | Admitting: Cardiovascular Disease

## 2014-01-25 ENCOUNTER — Ambulatory Visit (HOSPITAL_COMMUNITY): Payer: PPO | Attending: Cardiology

## 2014-01-25 DIAGNOSIS — E669 Obesity, unspecified: Secondary | ICD-10-CM | POA: Insufficient documentation

## 2014-01-25 DIAGNOSIS — Z9889 Other specified postprocedural states: Secondary | ICD-10-CM

## 2014-01-25 DIAGNOSIS — I10 Essential (primary) hypertension: Secondary | ICD-10-CM | POA: Insufficient documentation

## 2014-01-25 DIAGNOSIS — E119 Type 2 diabetes mellitus without complications: Secondary | ICD-10-CM | POA: Insufficient documentation

## 2014-01-25 DIAGNOSIS — Z87891 Personal history of nicotine dependence: Secondary | ICD-10-CM | POA: Insufficient documentation

## 2014-01-25 DIAGNOSIS — Z8679 Personal history of other diseases of the circulatory system: Secondary | ICD-10-CM

## 2014-01-25 DIAGNOSIS — I4891 Unspecified atrial fibrillation: Secondary | ICD-10-CM | POA: Diagnosis present

## 2014-01-25 MED ORDER — IOHEXOL 350 MG/ML SOLN
100.0000 mL | Freq: Once | INTRAVENOUS | Status: AC | PRN
  Administered 2014-01-25: 100 mL via INTRAVENOUS

## 2014-01-25 NOTE — Progress Notes (Signed)
2D Echo completed. 01/25/2014 

## 2014-01-27 ENCOUNTER — Ambulatory Visit (HOSPITAL_COMMUNITY): Payer: PPO | Attending: Cardiovascular Disease | Admitting: Radiology

## 2014-01-27 DIAGNOSIS — E119 Type 2 diabetes mellitus without complications: Secondary | ICD-10-CM | POA: Insufficient documentation

## 2014-01-27 DIAGNOSIS — I4891 Unspecified atrial fibrillation: Secondary | ICD-10-CM | POA: Diagnosis not present

## 2014-01-27 DIAGNOSIS — E139 Other specified diabetes mellitus without complications: Secondary | ICD-10-CM

## 2014-01-27 DIAGNOSIS — I10 Essential (primary) hypertension: Secondary | ICD-10-CM | POA: Insufficient documentation

## 2014-01-27 DIAGNOSIS — R0609 Other forms of dyspnea: Secondary | ICD-10-CM | POA: Insufficient documentation

## 2014-01-27 DIAGNOSIS — Z9889 Other specified postprocedural states: Secondary | ICD-10-CM

## 2014-01-27 DIAGNOSIS — Z8679 Personal history of other diseases of the circulatory system: Secondary | ICD-10-CM

## 2014-01-27 MED ORDER — TECHNETIUM TC 99M SESTAMIBI GENERIC - CARDIOLITE
33.0000 | Freq: Once | INTRAVENOUS | Status: AC | PRN
  Administered 2014-01-27: 33 via INTRAVENOUS

## 2014-01-27 MED ORDER — TECHNETIUM TC 99M SESTAMIBI GENERIC - CARDIOLITE
11.0000 | Freq: Once | INTRAVENOUS | Status: AC | PRN
  Administered 2014-01-27: 11 via INTRAVENOUS

## 2014-01-27 MED ORDER — REGADENOSON 0.4 MG/5ML IV SOLN
0.4000 mg | Freq: Once | INTRAVENOUS | Status: AC
  Administered 2014-01-27: 0.4 mg via INTRAVENOUS

## 2014-01-27 NOTE — Progress Notes (Signed)
[facility] [address] [place], [place] [address] [phone]    Cardiology Nuclear Med Study  Charles Bailey is a 69 y.o. male     MRN : [id number]     DOB: 09-Nov-1945  Procedure Date: 01/27/2014  Nuclear Med Background Indication for Stress Test:  Evaluation for Ischemia History:  Afib Cardiac Risk Factors: Hypertension and NIDDM  Symptoms:  DOE   Nuclear Pre-Procedure Caffeine/Decaff Intake:  None NPO After: 8:00pm   Lungs:  clear O2 Sat: 94% on room air. IV 0.9% NS with Angio Cath:  20g  IV Site: R Hand  IV Started by:  Matilde Haymaker, RN  Chest Size (in):  46 Cup Size: n/a  Height: 5\' 8"  (1.727 m)  Weight:  192 lb (87.091 kg)  BMI:  Body mass index is 29.2 kg/(m^2). Tech Comments:  n/a    Nuclear Med Study 1 or 2 day study: 1 day  Stress Test Type:  Treadmill/Lexiscan  Reading MD: Liane Comber, MD  Order Authorizing Provider:  P. Johnsie Cancel, MD  Resting Radionuclide: Technetium 47m Sestamibi  Resting Radionuclide Dose: 11.0 mCi   Stress Radionuclide:  Technetium 26m Sestamibi  Stress Radionuclide Dose: 33.0 mCi           Stress Protocol Rest HR: 83 Stress HR: 111  Rest BP: 188/111 Stress BP: 170/94  Exercise Time (min): n/a METS: n/a   Predicted Max HR: 154 bpm % Max HR: 72.08 bpm Rate Pressure Product: 20313   Dose of Adenosine (mg):  n/a Dose of Lexiscan: n/a mg  Dose of Atropine (mg): n/a Dose of Dobutamine: n/a mcg/kg/min (at max HR)  Stress Test Technologist: Glade Lloyd, BS-ES  Nuclear Technologist:  Earl Many, CNMT     Rest Procedure:  Myocardial perfusion imaging was performed at rest 45 minutes following the intravenous administration of Technetium 7m Sestamibi. Rest ECG: Atrial Fibrilliation, non-specific ST-T wave abnormalities  Stress Procedure:  The patient received IV Lexiscan 0.4 mg over 15-seconds.  Technetium 20m Sestamibi injected at 30-seconds.  Quantitative spect images were obtained after a 45  minute delay. Stress ECG: No significant change from baseline ECG  QPS Raw Data Images:  There is interference from nuclear activity from structures below the diaphragm. This does not affect the ability to read the study. Stress Images:  Normal homogeneous uptake in all areas of the myocardium. Rest Images:  Normal homogeneous uptake in all areas of the myocardium. Subtraction (SDS):  No evidence of ischemia. Transient Ischemic Dilatation (Normal <1.22):  1.20 Lung/Heart Ratio (Normal <0.45):  0.40  Quantitative Gated Spect Images QGS EDV:  N/A QGS ESV:  N/A  Impression Exercise Capacity:  Lexiscan with no exercise. BP Response:  Hypertension at rest that is exacerbated at stress. The patient was given iv Lopressor during the test. Clinical Symptoms:  No symptoms. ECG Impression:  No significant ST segment change suggestive of ischemia. Comparison with Prior Nuclear Study: No previous nuclear study performed  Overall Impression:  Normal stress nuclear study.  LV Ejection Fraction: Study not gated.  LV Wall Motion:  STUDY NOT GATED   Charles Bailey 01/27/2014

## 2014-02-09 ENCOUNTER — Encounter: Payer: Self-pay | Admitting: Cardiovascular Disease

## 2014-02-09 ENCOUNTER — Ambulatory Visit (INDEPENDENT_AMBULATORY_CARE_PROVIDER_SITE_OTHER): Payer: PPO | Admitting: Cardiovascular Disease

## 2014-02-09 ENCOUNTER — Telehealth: Payer: Self-pay | Admitting: Cardiovascular Disease

## 2014-02-09 VITALS — BP 136/80 | HR 64 | Ht 69.0 in | Wt 198.0 lb

## 2014-02-09 DIAGNOSIS — I10 Essential (primary) hypertension: Secondary | ICD-10-CM

## 2014-02-09 DIAGNOSIS — I4819 Other persistent atrial fibrillation: Secondary | ICD-10-CM

## 2014-02-09 DIAGNOSIS — I713 Abdominal aortic aneurysm, ruptured, unspecified: Secondary | ICD-10-CM

## 2014-02-09 DIAGNOSIS — I481 Persistent atrial fibrillation: Secondary | ICD-10-CM

## 2014-02-09 NOTE — Assessment & Plan Note (Signed)
F/U vascular  US yearly at this point should suffice

## 2014-02-09 NOTE — Assessment & Plan Note (Signed)
Well controlled.  Continue current medications and low sodium Dash type diet.    

## 2014-02-09 NOTE — Patient Instructions (Signed)
Your physician recommends that you schedule a follow-up appointment in:   [place]  Your physician recommends that you continue on your current medications as directed. Please refer to the Current Medication list given to you today.  Your physician recommends that you return for lab work in:  [place] has recommended that you have a Cardioversion (DCCV). Electrical Cardioversion uses a jolt of electricity to your heart either through paddles or wired patches attached to your chest. This is a controlled, usually prescheduled, procedure. Defibrillation is done under light anesthesia in the hospital, and you usually go home the day of the procedure. This is done to get your heart back into a normal rhythm. You are not awake for the procedure. Please see the instruction sheet given to you today. MAY HAVE DONE O N  Thursday  02-17-14 TO  CALL BACK

## 2014-02-09 NOTE — Telephone Encounter (Signed)
LMTCB ./CY 

## 2014-02-09 NOTE — Assessment & Plan Note (Signed)
He will let us know if he wants to proceed with [facility] Procedure discussed at length  Will need CBC and BMET within 48 hrs for anesthesia Discussed importance of not missing dose of pradaxa Samples given

## 2014-02-09 NOTE — Telephone Encounter (Signed)
New message    Patient calling stating he could not do his procedure next week. Will give some dates when you call back

## 2014-02-09 NOTE — Progress Notes (Signed)
Patient ID: Charles Bailey, male   DOB: 1945-07-08, 69 y.o.   MRN: [id number]    69 y.o. [facility] for afib History of DM, elevated lipids , HTN and vascular disease with AAA repair in 2007 No documented CAD But never had stress test.  Obese and sedentary poor functional capacity.  Thinks exertional dyspnea worse over summer. Does not note palpitations and no idea When he might have gone into afib.  2007 presented with AAA rupture and had aortobifem.  No f/u with Dr Kellie Simmering recently  Started on Pradaxa by Dr Dennard Schaumann  12/10 Labs reviewed and BMET, CBC ok No bleeding issues.  On beta blocker for rate control   Echo 01/25/14 Reviewed normal EF  Myovue done sam day normal no ischemia or infarct  EF not gated due to afib  Study Conclusions  - Left ventricle: The cavity size was normal. There was mild focal basal hypertrophy of the septum. Systolic function was normal. The estimated ejection fraction was in the range of 55% to 60%. Wall motion was normal; there were no regional wall motion abnormalities. - Aortic valve: There was mild regurgitation. - Left atrium: The atrium was severely dilated. - Right atrium: The atrium was mildly dilated.  Impressions:  - Normal LV function; severe LAE; mild RAE; mild AI.  Long discussion about one attempt at [facility]  May not be successful given severe LAE.  He is well anticoagulated and rate controlled With no evidence of CAD and normal EF He should do ok if he chooses rate control and anticoagulation as a strategy   ROS: Denies fever, malais, weight loss, blurry vision, decreased visual acuity, cough, sputum, SOB, hemoptysis, pleuritic pain, palpitaitons, heartburn, abdominal pain, melena, lower extremity edema, claudication, or rash.  All other systems reviewed and negative   General: Affect appropriate Obese white male  HEENT: normal Neck supple with no adenopathy JVP normal no bruits no thyromegaly Lungs clear  with no wheezing and good diaphragmatic motion Heart:  S1/S2 no murmur,rub, gallop or click PMI normal Abdomen: benighn, BS positve, no tenderness, Aorto bifem  no bruit.  No HSM or HJR Distal pulses intact with no bruits No edema Neuro non-focal Skin warm and dry No muscular weakness  Medications Current Outpatient Prescriptions  Medication Sig Dispense Refill  . amLODipine (NORVASC) 5 MG tablet Take 1 tablet (5 mg total) by mouth daily. 90 tablet 3  . Cholecalciferol (VITAMIN D3) 1000 UNITS CAPS Take 2,000 Units by mouth daily.     . cloNIDine (CATAPRES) 0.2 MG tablet Take 1 tablet (0.2 mg total) by mouth 2 (two) times daily. 180 tablet 3  . dabigatran (PRADAXA) 150 MG CAPS capsule Take 1 capsule (150 mg total) by mouth 2 (two) times daily. 180 capsule 3  . fish oil-omega-3 fatty acids 1000 MG capsule Take 1,200 mg by mouth daily.    Marland Kitchen lisinopril (PRINIVIL,ZESTRIL) 40 MG tablet Take 1 tablet (40 mg total) by mouth daily. 90 tablet 3  . meclizine (ANTIVERT) 25 MG tablet Take 1 tablet (25 mg total) by mouth 3 (three) times daily as needed for dizziness. 30 tablet 1  . metFORMIN (GLUCOPHAGE) 1000 MG tablet Take 1 tablet (1,000 mg total) by mouth 2 (two) times daily with a meal. 180 tablet 3  . metoprolol (LOPRESSOR) 50 MG tablet Take 1 tablet (50 mg total) by mouth 2 (two) times daily. 180 tablet 3  . pravastatin (PRAVACHOL) 40 MG tablet Take 1 tablet (40 mg total)  by mouth daily. 90 tablet 3   No current facility-administered medications for this visit.    Allergies Review of patient's allergies indicates no known allergies.  Family History: Family History  Problem Relation Age of Onset  . Heart disease Father   . Diabetes Paternal Aunt   . Heart disease Paternal Uncle   . Heart disease Paternal Grandfather     Social History: History   Social History  . Marital Status: Married    Spouse Name: N/A    Number of Children: N/A  . Years of Education: N/A   Occupational  History  . Not on file.   Social History Main Topics  . Smoking status: Former Smoker -- 15 years    Types: Cigarettes    Quit date: 03/03/2005  . Smokeless tobacco: Never Used  . Alcohol Use: 2.4 oz/week    4 Cans of beer per week  . Drug Use: No  . Sexual Activity: Not on file   Other Topics Concern  . Not on file   Social History Narrative    Past Surgical History  Procedure Laterality Date  . Abdominal aortic aneurysm repair  2007  . Inguinal hernia repair  1968    right    Past Medical History  Diagnosis Date  . Hyperlipidemia   . Hypertension   . Diabetes mellitus   . Adenomatous colon polyp 2003    Electrocardiogram:  12/23/13  afib rate 55 low voltage diffuse nonspecific ST/T wave changes   Assessment and Plan

## 2014-02-11 NOTE — Telephone Encounter (Signed)
NOTED ./CY 

## 2014-02-11 NOTE — Telephone Encounter (Signed)
Per pt's wife pt wants to hold off on the procedure and will back to schedule

## 2014-03-07 ENCOUNTER — Encounter: Payer: Self-pay | Admitting: Internal Medicine

## 2014-03-08 ENCOUNTER — Telehealth: Payer: Self-pay | Admitting: Cardiovascular Disease

## 2014-03-08 DIAGNOSIS — Z01818 Encounter for other preprocedural examination: Secondary | ICD-10-CM

## 2014-03-08 NOTE — Telephone Encounter (Signed)
New message    Patient would like to be set up for cardioversion in march.

## 2014-03-08 NOTE — Telephone Encounter (Signed)
WILL DISCUSS WITH   DR Johnsie Cancel WHEN   TO  SCHEDULE .Charles Bailey

## 2014-03-09 ENCOUNTER — Encounter: Payer: Self-pay | Admitting: *Deleted

## 2014-03-09 ENCOUNTER — Other Ambulatory Visit: Payer: Self-pay | Admitting: *Deleted

## 2014-03-09 NOTE — Addendum Note (Signed)
Addended by: Devra Dopp E on: 03/09/2014 01:09 PM   Modules accepted: Orders

## 2014-03-09 NOTE — Telephone Encounter (Signed)
PT  NOTIFIED   DCCV  SCHEDULED  FOR   03-29-14  WITH DR  Johnsie Cancel .Adonis Housekeeper

## 2014-03-25 ENCOUNTER — Other Ambulatory Visit (INDEPENDENT_AMBULATORY_CARE_PROVIDER_SITE_OTHER): Payer: PPO | Admitting: *Deleted

## 2014-03-25 DIAGNOSIS — Z01818 Encounter for other preprocedural examination: Secondary | ICD-10-CM

## 2014-03-25 LAB — CBC WITH DIFFERENTIAL/PLATELET
Basophils Absolute: 0 10*3/uL (ref 0.0–0.1)
Basophils Relative: 0.5 % (ref 0.0–3.0)
EOS ABS: 0.2 10*3/uL (ref 0.0–0.7)
Eosinophils Relative: 2.5 % (ref 0.0–5.0)
HCT: 42.4 % (ref 39.0–52.0)
Hemoglobin: 14.5 g/dL (ref 13.0–17.0)
LYMPHS PCT: 20.8 % (ref 12.0–46.0)
Lymphs Abs: 1.5 10*3/uL (ref 0.7–4.0)
MCHC: 34.1 g/dL (ref 30.0–36.0)
MCV: 86.3 fl (ref 78.0–100.0)
MONO ABS: 0.6 10*3/uL (ref 0.1–1.0)
Monocytes Relative: 8.7 % (ref 3.0–12.0)
NEUTROS PCT: 67.5 % (ref 43.0–77.0)
Neutro Abs: 4.8 10*3/uL (ref 1.4–7.7)
Platelets: 193 10*3/uL (ref 150.0–400.0)
RBC: 4.92 Mil/uL (ref 4.22–5.81)
RDW: 13.6 % (ref 11.5–15.5)
WBC: 7.2 10*3/uL (ref 4.0–10.5)

## 2014-03-25 LAB — BASIC METABOLIC PANEL
BUN: 18 mg/dL (ref 6–23)
CO2: 27 meq/L (ref 19–32)
Calcium: 9.5 mg/dL (ref 8.4–10.5)
Chloride: 104 mEq/L (ref 96–112)
Creatinine, Ser: 1.27 mg/dL (ref 0.40–1.50)
GFR: 60.17 mL/min (ref >60.00)
GLUCOSE: 209 mg/dL — AB (ref 70–99)
POTASSIUM: 4.3 meq/L (ref 3.5–5.1)
Sodium: 137 mEq/L (ref 135–145)

## 2014-03-25 NOTE — Addendum Note (Signed)
Addended by: Eulis Foster on: 03/25/2014 01:02 PM   Modules accepted: Orders

## 2014-03-28 ENCOUNTER — Ambulatory Visit: Payer: Medicare Other | Admitting: Family Medicine

## 2014-03-29 ENCOUNTER — Encounter (HOSPITAL_COMMUNITY): Payer: Self-pay | Admitting: *Deleted

## 2014-03-29 ENCOUNTER — Ambulatory Visit (HOSPITAL_COMMUNITY): Payer: PPO | Admitting: Anesthesiology

## 2014-03-29 ENCOUNTER — Ambulatory Visit (HOSPITAL_COMMUNITY)
Admission: RE | Admit: 2014-03-29 | Discharge: 2014-03-29 | Disposition: A | Discharge status: Home / Self Care | Payer: PPO | Source: Ambulatory Visit | Attending: Cardiovascular Disease | Admitting: Cardiovascular Disease

## 2014-03-29 ENCOUNTER — Encounter (HOSPITAL_COMMUNITY)
Admission: RE | Disposition: A | Discharge status: Home / Self Care | Payer: Self-pay | Source: Ambulatory Visit | Attending: Cardiovascular Disease

## 2014-03-29 DIAGNOSIS — Z87891 Personal history of nicotine dependence: Secondary | ICD-10-CM | POA: Diagnosis not present

## 2014-03-29 DIAGNOSIS — K5730 Diverticulosis of large intestine without perforation or abscess without bleeding: Secondary | ICD-10-CM | POA: Insufficient documentation

## 2014-03-29 DIAGNOSIS — Z86010 Personal history of colonic polyps: Secondary | ICD-10-CM | POA: Insufficient documentation

## 2014-03-29 DIAGNOSIS — E669 Obesity, unspecified: Secondary | ICD-10-CM | POA: Insufficient documentation

## 2014-03-29 DIAGNOSIS — E785 Hyperlipidemia, unspecified: Secondary | ICD-10-CM | POA: Insufficient documentation

## 2014-03-29 DIAGNOSIS — E119 Type 2 diabetes mellitus without complications: Secondary | ICD-10-CM | POA: Insufficient documentation

## 2014-03-29 DIAGNOSIS — I10 Essential (primary) hypertension: Secondary | ICD-10-CM | POA: Insufficient documentation

## 2014-03-29 DIAGNOSIS — I4891 Unspecified atrial fibrillation: Secondary | ICD-10-CM | POA: Diagnosis not present

## 2014-03-29 DIAGNOSIS — I480 Paroxysmal atrial fibrillation: Secondary | ICD-10-CM

## 2014-03-29 DIAGNOSIS — Z6829 Body mass index (BMI) 29.0-29.9, adult: Secondary | ICD-10-CM | POA: Insufficient documentation

## 2014-03-29 HISTORY — PX: CARDIOVERSION: SHX1299

## 2014-03-29 LAB — GLUCOSE, CAPILLARY: GLUCOSE-CAPILLARY: 162 mg/dL — AB (ref 70–99)

## 2014-03-29 SURGERY — CARDIOVERSION
Anesthesia: Monitor Anesthesia Care

## 2014-03-29 MED ORDER — LIDOCAINE HCL (CARDIAC) 20 MG/ML IV SOLN
INTRAVENOUS | Status: DC | PRN
  Administered 2014-03-29: 40 mg via INTRAVENOUS

## 2014-03-29 MED ORDER — PROMETHAZINE HCL 25 MG/ML IJ SOLN: 6.2500 mg | INTRAMUSCULAR | Status: DC | PRN

## 2014-03-29 MED ORDER — PROPOFOL 10 MG/ML IV BOLUS
INTRAVENOUS | Status: DC | PRN
Start: 2014-03-29 — End: 2014-03-29
  Administered 2014-03-29: 70 mg via INTRAVENOUS

## 2014-03-29 MED ORDER — SODIUM CHLORIDE 0.9 % IV SOLN
INTRAVENOUS | Status: DC
  Administered 2014-03-29: 11:00:00 via INTRAVENOUS

## 2014-03-29 MED ORDER — FENTANYL CITRATE 0.05 MG/ML IJ SOLN: 25.0000 ug | INTRAMUSCULAR | Status: DC | PRN

## 2014-03-29 MED ORDER — MEPERIDINE HCL 100 MG/ML IJ SOLN: 6.2500 mg | INTRAMUSCULAR | Status: DC | PRN

## 2014-03-29 NOTE — CV Procedure (Signed)
[place]: Anesthesia:  Propofol  ON Rx pradaxa bid  DCC x 1 120 J biphasix Converted to sinus bradycardia rate 42 no AV block  No immediate neurologic sequelae  Will hold lopressor as outpatient  Jenkins Rouge

## 2014-03-29 NOTE — Anesthesia Preprocedure Evaluation (Signed)
Anesthesia Evaluation  Patient identified by MRN, date of birth, ID band Patient awake    Reviewed: Allergy & Precautions, NPO status , Patient's Chart, lab work & pertinent test results  Airway        Dental   Pulmonary former smoker (quit 2007),          Cardiovascular hypertension,  ECHO 01/2014 EF 55%, STRESS 01/2014 NEG   Neuro/Psych    GI/Hepatic negative GI ROS, Neg liver ROS,   Endo/Other  diabetes, Type 2  Renal/GU negative Renal ROS     Musculoskeletal   Abdominal   Peds  Hematology 14/42   Anesthesia Other Findings   Reproductive/Obstetrics                             Anesthesia Physical Anesthesia Plan  ASA: III  Anesthesia Plan: MAC   Post-op Pain Management:    Induction: Intravenous  Airway Management Planned: Nasal Cannula  Additional Equipment:   Intra-op Plan:   Post-operative Plan:   Informed Consent: I have reviewed the patients History and Physical, chart, labs and discussed the procedure including the risks, benefits and alternatives for the proposed anesthesia with the patient or authorized representative who has indicated his/her understanding and acceptance.     Plan Discussed with:   Anesthesia Plan Comments:         Anesthesia Quick Evaluation

## 2014-03-29 NOTE — Discharge Instructions (Addendum)
Patient is to HOLD Metoprolol until he is seen in the office.  Continue Pradaxa Ok to eat when awake and alert Stop taking metoprolol  F/U our office with PA early next week and me next available Office will call  Electrical Cardioversion, Care After Refer to this sheet in the next few weeks. These instructions provide you with information on caring for yourself after your procedure. Your health care provider may also give you more specific instructions. Your treatment has been planned according to current medical practices, but problems sometimes occur. Call your health care provider if you have any problems or questions after your procedure. WHAT TO EXPECT AFTER THE PROCEDURE After your procedure, it is typical to have the following sensations:  Some redness on the skin where the shocks were delivered. If this is tender, a sunburn lotion or hydrocortisone cream may help.  Possible return of an abnormal heart rhythm within hours or days after the procedure. HOME CARE INSTRUCTIONS  Take medicines only as directed by your health care provider. Be sure you understand how and when to take your medicine.  Learn how to feel your pulse and check it often.  Limit your activity for 48 hours after the procedure or as directed by your health care provider.  Avoid or minimize caffeine and other stimulants as directed by your health care provider. SEEK MEDICAL CARE IF:  You feel like your heart is beating too fast or your pulse is not regular.  You have any questions about your medicines.  You have bleeding that will not stop. SEEK IMMEDIATE MEDICAL CARE IF:  You are dizzy or feel faint.  It is hard to breathe or you feel short of breath.  There is a change in discomfort in your chest.  Your speech is slurred or you have trouble moving an arm or leg on one side of your body.  You get a serious muscle cramp that does not go away.  Your fingers or toes turn cold or blue. Document  Released: 10/21/2012 Document Revised: 05/17/2013 Document Reviewed: 10/21/2012 [facility] Patient Information 2015 [place], [place]. This information is not intended to replace advice given to you by your health care provider. Make sure you discuss any questions you have with your health care provider.   Conscious Sedation, Adult, Care After Refer to this sheet in the next few weeks. These instructions provide you with information on caring for yourself after your procedure. Your health care provider may also give you more specific instructions. Your treatment has been planned according to current medical practices, but problems sometimes occur. Call your health care provider if you have any problems or questions after your procedure. WHAT TO EXPECT AFTER THE PROCEDURE  After your procedure:  You may feel sleepy, clumsy, and have poor balance for several hours.  Vomiting may occur if you eat too soon after the procedure. HOME CARE INSTRUCTIONS  Do not participate in any activities where you could become injured for at least 24 hours. Do not:  Drive.  Swim.  Ride a bicycle.  Operate heavy machinery.  Cook.  Use power tools.  Climb ladders.  Work from a high place.  Do not make important decisions or sign legal documents until you are improved.  If you vomit, drink water, juice, or soup when you can drink without vomiting. Make sure you have little or no nausea before eating solid foods.  Only take over-the-counter or prescription medicines for pain, discomfort, or fever as directed by your health care  provider.  Make sure you and your family fully understand everything about the medicines given to you, including what side effects may occur.  You should not drink alcohol, take sleeping pills, or take medicines that cause drowsiness for at least 24 hours.  If you smoke, do not smoke without supervision.  If you are feeling better, you may resume normal activities 24 hours after  you were sedated.  Keep all appointments with your health care provider. SEEK MEDICAL CARE IF:  Your skin is pale or bluish in color.  You continue to feel nauseous or vomit.  Your pain is getting worse and is not helped by medicine.  You have bleeding or swelling.  You are still sleepy or feeling clumsy after 24 hours. SEEK IMMEDIATE MEDICAL CARE IF:  You develop a rash.  You have difficulty breathing.  You develop any type of allergic problem.  You have a fever. MAKE SURE YOU:  Understand these instructions.  Will watch your condition.  Will get help right away if you are not doing well or get worse. Document Released: 10/21/2012 Document Reviewed: 10/21/2012 [facility] Patient Information 2015 [place], [place]. This information is not intended to replace advice given to you by your health care provider. Make sure you discuss any questions you have with your health care provider.

## 2014-03-29 NOTE — Anesthesia Postprocedure Evaluation (Signed)
  Anesthesia Post-op Note  Patient: Charles Bailey  Procedure(s) Performed: Procedure(s): CARDIOVERSION (N/A)  Patient Location: PACU and Endoscopy Unit  Anesthesia Type:MAC  Level of Consciousness: awake, alert  and oriented  Airway and Oxygen Therapy: Patient Spontanous Breathing  Post-op Pain: none  Post-op Assessment: Post-op Vital signs reviewed, Patient's Cardiovascular Status Stable, Respiratory Function Stable, Patent Airway and No signs of Nausea or vomiting  Post-op Vital Signs: Reviewed and stable  Last Vitals:  Filed Vitals:   03/29/14 1300  BP: 118/55  Pulse: 44  Temp:   Resp: 13    Complications: No apparent anesthesia complications

## 2014-03-29 NOTE — Transfer of Care (Signed)
Immediate Anesthesia Transfer of Care Note  Patient: Charles Bailey  Procedure(s) Performed: Procedure(s): CARDIOVERSION (N/A)  Patient Location: PACU and Endoscopy Unit  Anesthesia Type:General  Level of Consciousness: awake  Airway & Oxygen Therapy: Patient Spontanous Breathing and Patient connected to nasal cannula oxygen  Post-op Assessment: Report given to RN, Post -op Vital signs reviewed and stable, Patient moving all extremities and Patient moving all extremities X 4  Post vital signs: Reviewed and stable  Last Vitals:  Filed Vitals:   03/29/14 1124  BP: 140/81  Pulse: 53  Temp: 36.6 C  Resp: 14    Complications: No apparent anesthesia complications

## 2014-03-30 ENCOUNTER — Encounter (HOSPITAL_COMMUNITY): Payer: Self-pay | Admitting: Cardiovascular Disease

## 2014-04-04 ENCOUNTER — Ambulatory Visit (INDEPENDENT_AMBULATORY_CARE_PROVIDER_SITE_OTHER): Payer: PPO | Admitting: Family Medicine

## 2014-04-04 ENCOUNTER — Encounter: Payer: Self-pay | Admitting: Family Medicine

## 2014-04-04 VITALS — BP 160/90 | HR 88 | Temp 98.3°F | Resp 18 | Ht 68.0 in | Wt 197.0 lb

## 2014-04-04 DIAGNOSIS — Z0000 Encounter for general adult medical examination without abnormal findings: Secondary | ICD-10-CM | POA: Diagnosis not present

## 2014-04-04 DIAGNOSIS — E119 Type 2 diabetes mellitus without complications: Secondary | ICD-10-CM | POA: Diagnosis not present

## 2014-04-04 DIAGNOSIS — R009 Unspecified abnormalities of heart beat: Secondary | ICD-10-CM

## 2014-04-04 DIAGNOSIS — I480 Paroxysmal atrial fibrillation: Secondary | ICD-10-CM | POA: Diagnosis not present

## 2014-04-04 DIAGNOSIS — Z23 Encounter for immunization: Secondary | ICD-10-CM

## 2014-04-04 LAB — HEMOGLOBIN A1C
HEMOGLOBIN A1C: 7 % — AB (ref <5.7)
Mean Plasma Glucose: 154 mg/dL — ABNORMAL HIGH (ref <117)

## 2014-04-04 LAB — COMPLETE METABOLIC PANEL WITH GFR
ALT: 11 U/L (ref 0–53)
AST: 10 U/L (ref 0–37)
Albumin: 4.3 g/dL (ref 3.5–5.2)
Alkaline Phosphatase: 69 U/L (ref 39–117)
BUN: 21 mg/dL (ref 6–23)
CHLORIDE: 101 meq/L (ref 96–112)
CO2: 24 meq/L (ref 19–32)
CREATININE: 1.06 mg/dL (ref 0.50–1.35)
Calcium: 9.6 mg/dL (ref 8.4–10.5)
GFR, EST AFRICAN AMERICAN: 84 mL/min
GFR, EST NON AFRICAN AMERICAN: 73 mL/min
Glucose, Bld: 293 mg/dL — ABNORMAL HIGH (ref 70–99)
POTASSIUM: 4.7 meq/L (ref 3.5–5.3)
Sodium: 139 mEq/L (ref 135–145)
Total Bilirubin: 0.4 mg/dL (ref 0.2–1.2)
Total Protein: 7 g/dL (ref 6.0–8.3)

## 2014-04-04 LAB — LIPID PANEL
CHOLESTEROL: 114 mg/dL (ref 0–200)
HDL: 38 mg/dL — ABNORMAL LOW (ref >=40)
LDL Cholesterol: 46 mg/dL (ref 0–99)
Total CHOL/HDL Ratio: 3 Ratio
Triglycerides: 148 mg/dL (ref <150)
VLDL: 30 mg/dL (ref 0–40)

## 2014-04-04 LAB — MICROALBUMIN, URINE: MICROALB UR: 2.2 mg/dL — AB (ref <2.0)

## 2014-04-04 MED ORDER — LISINOPRIL 40 MG PO TABS: 40.0000 mg | ORAL_TABLET | Freq: Every day | ORAL | Status: DC

## 2014-04-04 MED ORDER — METOPROLOL TARTRATE 50 MG PO TABS: 50.0000 mg | ORAL_TABLET | Freq: Two times a day (BID) | ORAL | Status: DC

## 2014-04-04 MED ORDER — METFORMIN HCL 1000 MG PO TABS: 1000.0000 mg | ORAL_TABLET | Freq: Two times a day (BID) | ORAL | Status: DC

## 2014-04-04 MED ORDER — AMLODIPINE BESYLATE 5 MG PO TABS: 5.0000 mg | ORAL_TABLET | Freq: Every day | ORAL | Status: DC

## 2014-04-04 MED ORDER — CLONIDINE HCL 0.2 MG PO TABS: 0.2000 mg | ORAL_TABLET | Freq: Two times a day (BID) | ORAL | Status: DC

## 2014-04-04 MED ORDER — PRAVASTATIN SODIUM 40 MG PO TABS: 40.0000 mg | ORAL_TABLET | Freq: Every day | ORAL | Status: DC

## 2014-04-04 MED ORDER — DABIGATRAN ETEXILATE MESYLATE 150 MG PO CAPS: 150.0000 mg | ORAL_CAPSULE | Freq: Two times a day (BID) | ORAL | Status: DC

## 2014-04-04 NOTE — Progress Notes (Signed)
Subjective:    Patient ID: Charles Bailey, male    DOB: 07-09-1945, 69 y.o.   MRN: [id number]  HPI  Please see my last office visit. At that time the patient was found to be in atrial fibrillation. I refer the patient to a cardiologist. The patient underwent elective cardioversion recently. Unfortunately he has spontaneously reverted back to atrial fibrillation. Today he has a rapid heart rate ranging between 80 and 120 bpm depending upon the time at which you count his pulse. He also reports significant palpitations in the chest. He denies any shortness of breath or dyspnea on exertion or chest pain. He is also due to recheck his hemoglobin A1c. His last hemoglobin A1c was checked in December and found to be slightly elevated at 7.2. Given his atrial fibrillation and hypertension, the patient would benefit from a hemoglobin A1c less than 6.5. His blood pressure is also significantly elevated today 160/90. Recently his cardiologist had the patient discontinue Lopressor due to a slow heart rate. However today he has tachycardia with elevated blood pressure and may benefit from resuming the Lopressor. He also denies any myalgias or right upper quadrant pain on his statin medication. He is overdue to recheck his fasting lipid panel. Diabetic eye exam has been performed within the last year and all his pneumonia vaccines are up-to-date. Past Medical History  Diagnosis Date  . Hyperlipidemia   . Hypertension   . Diabetes mellitus   . Adenomatous colon polyp 2003   Past Surgical History  Procedure Laterality Date  . Abdominal aortic aneurysm repair  2007  . Inguinal hernia repair  1968    right  . Cardioversion N/A 03/29/2014    Procedure: CARDIOVERSION;  Surgeon: Josue Hector, MD;  Location: [facility] ENDOSCOPY;  Service: Cardiovascular;  Laterality: N/A;   Current Outpatient Prescriptions on File Prior to Visit  Medication Sig Dispense Refill  . Cholecalciferol (VITAMIN D3) 1000 UNITS CAPS Take 2,000  Units by mouth daily.     . fish oil-omega-3 fatty acids 1000 MG capsule Take 1,200 mg by mouth daily.     No current facility-administered medications on file prior to visit.   No Known Allergies History   Social History  . Marital Status: Married    Spouse Name: N/A  . Number of Children: N/A  . Years of Education: N/A   Occupational History  . Not on file.   Social History Main Topics  . Smoking status: Former Smoker -- 15 years    Types: Cigarettes    Quit date: 03/03/2005  . Smokeless tobacco: Never Used  . Alcohol Use: 2.4 oz/week    4 Cans of beer per week  . Drug Use: No  . Sexual Activity: Not on file   Other Topics Concern  . Not on file   Social History Narrative     Review of Systems  All other systems reviewed and are negative.      Objective:   Physical Exam  Constitutional: He appears well-developed and well-nourished.  Neck: Neck supple. No JVD present.  Cardiovascular: An irregularly irregular rhythm present. Tachycardia present.   Pulmonary/Chest: Effort normal and breath sounds normal. No respiratory distress. He has no wheezes. He has no rales.  Abdominal: Soft. Bowel sounds are normal. He exhibits no distension. There is no tenderness. There is no rebound and no guarding.  Musculoskeletal: He exhibits no edema.  Lymphadenopathy:    He has no cervical adenopathy.  Vitals reviewed.  Assessment & Plan:  Paroxysmal atrial fibrillation - Plan: metoprolol (LOPRESSOR) 50 MG tablet  Diabetes mellitus type II, controlled - Plan: COMPLETE METABOLIC PANEL WITH GFR, Lipid panel, Hemoglobin A1c, Microalbumin, urine, pravastatin (PRAVACHOL) 40 MG tablet, cloNIDine (CATAPRES) 0.2 MG tablet, amLODipine (NORVASC) 5 MG tablet, lisinopril (PRINIVIL,ZESTRIL) 40 MG tablet, metFORMIN (GLUCOPHAGE) 1000 MG tablet, DISCONTINUED: pravastatin (PRAVACHOL) 40 MG tablet, DISCONTINUED: metFORMIN (GLUCOPHAGE) 1000 MG tablet, DISCONTINUED: lisinopril  (PRINIVIL,ZESTRIL) 40 MG tablet, DISCONTINUED: cloNIDine (CATAPRES) 0.2 MG tablet, DISCONTINUED: amLODipine (NORVASC) 5 MG tablet, DISCONTINUED: metFORMIN (GLUCOPHAGE) 1000 MG tablet  Abnormal heart rate - Plan: pravastatin (PRAVACHOL) 40 MG tablet, cloNIDine (CATAPRES) 0.2 MG tablet, amLODipine (NORVASC) 5 MG tablet, lisinopril (PRINIVIL,ZESTRIL) 40 MG tablet, metFORMIN (GLUCOPHAGE) 1000 MG tablet, DISCONTINUED: pravastatin (PRAVACHOL) 40 MG tablet, DISCONTINUED: metFORMIN (GLUCOPHAGE) 1000 MG tablet, DISCONTINUED: lisinopril (PRINIVIL,ZESTRIL) 40 MG tablet, DISCONTINUED: cloNIDine (CATAPRES) 0.2 MG tablet, DISCONTINUED: amLODipine (NORVASC) 5 MG tablet, DISCONTINUED: metFORMIN (GLUCOPHAGE) 1000 MG tablet  Routine general medical examination at a health care facility - Plan: pravastatin (PRAVACHOL) 40 MG tablet, cloNIDine (CATAPRES) 0.2 MG tablet, amLODipine (NORVASC) 5 MG tablet, lisinopril (PRINIVIL,ZESTRIL) 40 MG tablet, metFORMIN (GLUCOPHAGE) 1000 MG tablet, DISCONTINUED: pravastatin (PRAVACHOL) 40 MG tablet, DISCONTINUED: metFORMIN (GLUCOPHAGE) 1000 MG tablet, DISCONTINUED: lisinopril (PRINIVIL,ZESTRIL) 40 MG tablet, DISCONTINUED: cloNIDine (CATAPRES) 0.2 MG tablet, DISCONTINUED: amLODipine (NORVASC) 5 MG tablet, DISCONTINUED: metFORMIN (GLUCOPHAGE) 1000 MG tablet  Need for prophylactic vaccination against Streptococcus pneumoniae (pneumococcus) - Plan: pravastatin (PRAVACHOL) 40 MG tablet, cloNIDine (CATAPRES) 0.2 MG tablet, amLODipine (NORVASC) 5 MG tablet, lisinopril (PRINIVIL,ZESTRIL) 40 MG tablet, metFORMIN (GLUCOPHAGE) 1000 MG tablet, DISCONTINUED: pravastatin (PRAVACHOL) 40 MG tablet, DISCONTINUED: metFORMIN (GLUCOPHAGE) 1000 MG tablet, DISCONTINUED: lisinopril (PRINIVIL,ZESTRIL) 40 MG tablet, DISCONTINUED: cloNIDine (CATAPRES) 0.2 MG tablet, DISCONTINUED: amLODipine (NORVASC) 5 MG tablet, DISCONTINUED: metFORMIN (GLUCOPHAGE) 1000 MG tablet  Need for prophylactic vaccination and inoculation  against influenza - Plan: pravastatin (PRAVACHOL) 40 MG tablet, cloNIDine (CATAPRES) 0.2 MG tablet, amLODipine (NORVASC) 5 MG tablet, lisinopril (PRINIVIL,ZESTRIL) 40 MG tablet, metFORMIN (GLUCOPHAGE) 1000 MG tablet, DISCONTINUED: pravastatin (PRAVACHOL) 40 MG tablet, DISCONTINUED: metFORMIN (GLUCOPHAGE) 1000 MG tablet, DISCONTINUED: lisinopril (PRINIVIL,ZESTRIL) 40 MG tablet, DISCONTINUED: cloNIDine (CATAPRES) 0.2 MG tablet, DISCONTINUED: amLODipine (NORVASC) 5 MG tablet, DISCONTINUED: metFORMIN (GLUCOPHAGE) 1000 MG tablet  Patient is back in nature fibrillation. He is appropriately anticoagulated on pullback so. I will start the patient back on Lopressor 50 mg by mouth twice a day due to his rapid heart rate and his elevated blood pressure. Patient will see his cardiologist on Friday and at that time I would respectfully ask the cardiologist to recheck his blood pressure and his heart rate on the medication. Patient will notify me of his blood pressure later this week. I will also check a hemoglobin A1c. Goal hemoglobin A1c is less than 6.5. I will check a urine microalbumin to screen for diabetic nephropathy. If elevated, the patient is already on 40 mg a day of lisinopril and therefore we have maximized medical therapy other than control his blood sugars more tightly. I will also check a fasting lipid panel. Goal LDL cholesterol is less than 100

## 2014-04-07 ENCOUNTER — Other Ambulatory Visit: Payer: Self-pay | Admitting: Family Medicine

## 2014-04-07 MED ORDER — SITAGLIPTIN PHOSPHATE 100 MG PO TABS: 100.0000 mg | ORAL_TABLET | Freq: Every day | ORAL | Status: DC

## 2014-04-08 ENCOUNTER — Encounter: Payer: Self-pay | Admitting: Nurse Practitioner

## 2014-04-08 ENCOUNTER — Ambulatory Visit (INDEPENDENT_AMBULATORY_CARE_PROVIDER_SITE_OTHER): Payer: PPO | Admitting: Nurse Practitioner

## 2014-04-08 VITALS — BP 124/84 | HR 50 | Ht 68.0 in | Wt 197.0 lb

## 2014-04-08 DIAGNOSIS — I481 Persistent atrial fibrillation: Secondary | ICD-10-CM | POA: Diagnosis not present

## 2014-04-08 DIAGNOSIS — I4819 Other persistent atrial fibrillation: Secondary | ICD-10-CM

## 2014-04-08 DIAGNOSIS — I10 Essential (primary) hypertension: Secondary | ICD-10-CM

## 2014-04-08 NOTE — Progress Notes (Signed)
CARDIOLOGY OFFICE NOTE  Date:  04/08/2014    [name] Date of Birth: 03/01/1945 Medical Record #[id number]  PCP:  Odette Fraction, MD  Cardiologist:  Johnsie Cancel    Chief Complaint  Patient presents with  . Atrial Fibrillation    Post cardioversion visit - seen for Dr. Johnsie Cancel     History of Present Illness: Charles Bailey is a 69 y.o. male who presents today for a post cardioversion visit. Seen for Dr. Johnsie Cancel. He has a history of HTN, type 2 DM, obesity, AAA, and PAF. He had satisfactory Myoview back in January of 2016. Echo in January 2016 with normal EF but severe LAE noted. He is anticoagulated with Pradaxa.   Most recently referred for cardioversion - successful initially - HR down in the 40's - beta blocker stopped.  Presented to his PCP earlier this week - back in AF with RVR - placed back on his metoprolol.  Comes in today. Here alone. He says he is doing well. He thought that he may have felt better the day after his cardioversion - "had more strength". The 2nd day after, he knew his heart was racing but really did not feel anything - noted it on his monitor. He remains on his Pradaxa with no issue. He notes that overall, he really never felt his AF and still does not feel anything. He is not really wanting to take any additional medicine. He is adamant about not being lightheaded or dizzy. No syncope. He does a nice job monitoring his HR at home.   Past Medical History  Diagnosis Date  . Hyperlipidemia   . Hypertension   . Diabetes mellitus   . Adenomatous colon polyp 2003    Past Surgical History  Procedure Laterality Date  . Abdominal aortic aneurysm repair  2007  . Inguinal hernia repair  1968    right  . Cardioversion N/A 03/29/2014    Procedure: CARDIOVERSION;  Surgeon: Josue Hector, MD;  Location: [facility] ENDOSCOPY;  Service: Cardiovascular;  Laterality: N/A;     Medications: Current Outpatient Prescriptions  Medication Sig Dispense Refill  .  amLODipine (NORVASC) 5 MG tablet Take 1 tablet (5 mg total) by mouth daily. 90 tablet 3  . Cholecalciferol (VITAMIN D3) 1000 UNITS CAPS Take 2,000 Units by mouth daily.     . cloNIDine (CATAPRES) 0.2 MG tablet Take 1 tablet (0.2 mg total) by mouth 2 (two) times daily. 180 tablet 3  . dabigatran (PRADAXA) 150 MG CAPS capsule Take 1 capsule (150 mg total) by mouth 2 (two) times daily. 180 capsule 3  . fish oil-omega-3 fatty acids 1000 MG capsule Take 1,200 mg by mouth daily.    Marland Kitchen lisinopril (PRINIVIL,ZESTRIL) 40 MG tablet Take 1 tablet (40 mg total) by mouth daily. 90 tablet 3  . metFORMIN (GLUCOPHAGE) 1000 MG tablet Take 1 tablet (1,000 mg total) by mouth 2 (two) times daily with a meal. 180 tablet 3  . metoprolol (LOPRESSOR) 50 MG tablet Take 1 tablet (50 mg total) by mouth 2 (two) times daily. 180 tablet 3  . pravastatin (PRAVACHOL) 40 MG tablet Take 1 tablet (40 mg total) by mouth daily. 90 tablet 3  . sitaGLIPtin (JANUVIA) 100 MG tablet Take 1 tablet (100 mg total) by mouth daily. 90 tablet 1   No current facility-administered medications for this visit.    Allergies: No Known Allergies  Social History: The patient  reports that he quit smoking about 9 years ago. His smoking  use included Cigarettes. He quit after 15 years of use. He has never used smokeless tobacco. He reports that he drinks about 2.4 oz of alcohol per week. He reports that he does not use illicit drugs.   Family History: The patient's family history includes Diabetes in his paternal aunt; Heart disease in his father, paternal grandfather, and paternal uncle; Heart failure in his mother.   Review of Systems: Please see the history of present illness.   Otherwise, the review of systems is positive for palpitations and rapid heart beat.   All other systems are reviewed and negative.   Physical Exam: VS:  BP 124/84 mmHg  Pulse 50  Ht 5\' 8"  (1.727 m)  Wt 197 lb (89.359 kg)  BMI 29.96 kg/m2 .  BMI Body mass index is  29.96 kg/(m^2).  Wt Readings from Last 3 Encounters:  04/08/14 197 lb (89.359 kg)  04/04/14 197 lb (89.359 kg)  03/29/14 198 lb (89.812 kg)    General: Pleasant. Well developed, well nourished and in no acute distress.  HEENT: Normal. Neck: Supple, no JVD, carotid bruits, or masses noted.  Cardiac: Irregular irregular rate and rhythm. His rate is 60 by me. No murmurs, rubs, or gallops. No edema.  Respiratory:  Lungs are clear to auscultation bilaterally with normal work of breathing.  GI: Soft and nontender.  MS: No deformity or atrophy. Gait and ROM intact. Skin: Warm and dry. Color is normal.  Neuro:  Strength and sensation are intact and no gross focal deficits noted.  Psych: Alert, appropriate and with normal affect.   LABORATORY DATA:  EKG:  EKG is ordered today. This demonstrates atrial fib with a slower ventricular response - rate is 50 today.  Lab Results  Component Value Date   WBC 7.2 03/25/2014   HGB 14.5 03/25/2014   HCT 42.4 03/25/2014   PLT 193.0 03/25/2014   GLUCOSE 293* 04/04/2014   CHOL 114 04/04/2014   TRIG 148 04/04/2014   HDL 38* 04/04/2014   LDLDIRECT 50 07/23/2013   LDLCALC 46 04/04/2014   ALT 11 04/04/2014   AST 10 04/04/2014   NA 139 04/04/2014   K 4.7 04/04/2014   CL 101 04/04/2014   CREATININE 1.06 04/04/2014   BUN 21 04/04/2014   CO2 24 04/04/2014   TSH 1.490 12/21/2013   PSA 2.99 12/21/2013   HGBA1C 7.0* 04/04/2014   MICROALBUR 2.2* 04/04/2014    BNP (last 3 results) No results for input(s): BNP in the last 8760 hours.  ProBNP (last 3 results) No results for input(s): PROBNP in the last 8760 hours.   Other Studies Reviewed Today:  [facility]: Anesthesia: Propofol  ON Rx pradaxa bid  DCC x 1 120 J biphasix Converted to sinus bradycardia rate 42 no AV block No immediate neurologic sequelae Will hold lopressor as outpatient  [facility]        Myoview Impression from 01/2014 Exercise Capacity: Lexiscan with no  exercise. BP Response: Hypertension at rest that is exacerbated at stress. The patient was given iv Lopressor during the test. Clinical Symptoms: No symptoms. ECG Impression: No significant ST segment change suggestive of ischemia. Comparison with Prior Nuclear Study: No previous nuclear study performed  Overall Impression: Normal stress nuclear study.  LV Ejection Fraction: Study not gated. LV Wall Motion: STUDY NOT GATED   Dorothy Spark 01/27/2014   Echo Study Conclusions from 01/2014  - Left ventricle: The cavity size was normal. There was mild focal basal hypertrophy of the septum. Systolic function  was normal. The estimated ejection fraction was in the range of 55% to 60%. Wall motion was normal; there were no regional wall motion abnormalities. - Aortic valve: There was mild regurgitation. - Left atrium: The atrium was severely dilated. - Right atrium: The atrium was mildly dilated.  Impressions:  - Normal LV function; severe LAE; mild RAE; mild AI.  Assessment/Plan: 1. PAF - possible tachy brady - has severe LAE - making restoration/holding in NSR difficult - most likely will need AAD therapy - explained that we do not have a "cure" but need to manage this condition - I have offered to refer him to the AF clinic - he really would like to "just stay as I am". He will continue with his current regimen. Monitor HR and BP at home. Call us for any dizzy spells, etc. Will get him back in about 3 months to recheck.   2. Chronic anticoagulation -  On Pradaxa -  Samples given today.  3. HTN -  BP great on current regimen.   Current medicines are reviewed with the patient today.  The patient does not have concerns regarding medicines other than what has been noted above.  The following changes have been made:  See above.  Labs/ tests ordered today include:    Orders Placed This Encounter  Procedures  . EKG 12-Lead     Disposition:   FU with Dr. Johnsie Cancel in  3 months.  Patient is agreeable to this plan and will call if any problems develop in the interim.   Signed: Burtis Junes, RN, ANP-C 04/08/2014 10:36 AM  [facility] [address] [place], [place]  [address] Phone: [phone] Fax: [phone]

## 2014-04-08 NOTE — Patient Instructions (Addendum)
Stay on your current medicines  Monitor your heart rate and BP at home   Call us for any dizzy spells  See Dr. Johnsie Cancel in 3 months  Call the [place] office at [phone] if you have any questions, problems or concerns.

## 2014-04-14 NOTE — H&P (Signed)
Patient ID: Charles Bailey, male DOB: Jun 12, 1945, 70 y.o. MRN: [id number]    69 y.o. [facility] History of DM, elevated lipids , HTN and vascular disease with AAA repair in 2007 No documented CAD But never had stress test. Obese and sedentary poor functional capacity. Thinks exertional dyspnea worse over summer. Does not note palpitations and no idea When he might have gone into afib. 2007 presented with AAA rupture and had aortobifem. No f/u with Dr Kellie Simmering recently Started on Pradaxa by Dr Dennard Schaumann 12/10 Labs reviewed and BMET, CBC ok No bleeding issues. On beta blocker for rate control   Echo 01/25/14 Reviewed normal EF Myovue done sam day normal no ischemia or infarct EF not gated due to afib  Study Conclusions  - Left ventricle: The cavity size was normal. There was mild focal basal hypertrophy of the septum. Systolic function was normal. The estimated ejection fraction was in the range of 55% to 60%. Wall motion was normal; there were no regional wall motion abnormalities. - Aortic valve: There was mild regurgitation. - Left atrium: The atrium was severely dilated. - Right atrium: The atrium was mildly dilated.  Impressions:  - Normal LV function; severe LAE; mild RAE; mild AI.  Long discussion about one attempt at [facility] May not be successful given severe LAE. He is well anticoagulated and rate controlled With no evidence of CAD and normal EF He should do ok if he chooses rate control and anticoagulation as a strategy   ROS: Denies fever, malais, weight loss, blurry vision, decreased visual acuity, cough, sputum, SOB, hemoptysis, pleuritic pain, palpitaitons, heartburn, abdominal pain, melena, lower extremity edema, claudication, or rash. All other systems reviewed and negative  140/60 afib rate 77  RR 12  General: Affect appropriate Obese white male  HEENT: normal Neck supple with no adenopathy JVP normal no bruits  no thyromegaly Lungs clear with no wheezing and good diaphragmatic motion Heart: S1/S2 no murmur,rub, gallop or click PMI normal Abdomen: benighn, BS positve, no tenderness, Aorto bifem  no bruit. No HSM or HJR Distal pulses intact with no bruits No edema Neuro non-focal Skin warm and dry No muscular weakness  Medications Current Outpatient Prescriptions  Medication Sig Dispense Refill  . amLODipine (NORVASC) 5 MG tablet Take 1 tablet (5 mg total) by mouth daily. 90 tablet 3  . Cholecalciferol (VITAMIN D3) 1000 UNITS CAPS Take 2,000 Units by mouth daily.     . cloNIDine (CATAPRES) 0.2 MG tablet Take 1 tablet (0.2 mg total) by mouth 2 (two) times daily. 180 tablet 3  . dabigatran (PRADAXA) 150 MG CAPS capsule Take 1 capsule (150 mg total) by mouth 2 (two) times daily. 180 capsule 3  . fish oil-omega-3 fatty acids 1000 MG capsule Take 1,200 mg by mouth daily.    Marland Kitchen lisinopril (PRINIVIL,ZESTRIL) 40 MG tablet Take 1 tablet (40 mg total) by mouth daily. 90 tablet 3  . meclizine (ANTIVERT) 25 MG tablet Take 1 tablet (25 mg total) by mouth 3 (three) times daily as needed for dizziness. 30 tablet 1  . metFORMIN (GLUCOPHAGE) 1000 MG tablet Take 1 tablet (1,000 mg total) by mouth 2 (two) times daily with a meal. 180 tablet 3  . metoprolol (LOPRESSOR) 50 MG tablet Take 1 tablet (50 mg total) by mouth 2 (two) times daily. 180 tablet 3  . pravastatin (PRAVACHOL) 40 MG tablet Take 1 tablet (40 mg total) by mouth daily. 90 tablet 3   No current facility-administered  medications for this visit.    Allergies Review of patient's allergies indicates no known allergies.  Family History: Family History  Problem Relation Age of Onset  . Heart disease Father   . Diabetes Paternal Aunt   . Heart disease Paternal Uncle   . Heart disease Paternal Grandfather     Social History: History   Social History  . Marital  Status: Married    Spouse Name: N/A    Number of Children: N/A  . Years of Education: N/A   Occupational History  . Not on file.   Social History Main Topics  . Smoking status: Former Smoker -- 15 years    Types: Cigarettes    Quit date: 03/03/2005  . Smokeless tobacco: Never Used  . Alcohol Use: 2.4 oz/week    4 Cans of beer per week  . Drug Use: No  . Sexual Activity: Not on file   Other Topics Concern  . Not on file   Social History Narrative    Past Surgical History  Procedure Laterality Date  . Abdominal aortic aneurysm repair  2007  . Inguinal hernia repair  1968    right    Past Medical History  Diagnosis Date  . Hyperlipidemia   . Hypertension   . Diabetes mellitus   . Adenomatous colon polyp 2003    Electrocardiogram: 12/23/13 afib rate 55 low voltage diffuse nonspecific ST/T wave changes   Assessment and Plan              HTN (hypertension) - Josue Hector, MD at 02/09/2014 3:27 PM     Status: Written Related Problem: HTN (hypertension)   Expand All Collapse All   Well controlled. Continue current medications and low sodium Dash type diet.              AAA (abdominal aortic aneurysm, ruptured) - Josue Hector, MD at 02/09/2014 3:28 PM     Status: Written Related Problem: AAA (abdominal aortic aneurysm, ruptured)   Expand All Collapse All   F/U vascular US yearly at this point should suffice             A-fib - Josue Hector, MD at 02/09/2014 3:29 PM     Status: Written Related Problem: A-fib   Expand All Collapse All   On Rx pradaxa NPO  For [facility] today  Risks including CVA discussed willing to proceed             Referring Provider     Susy Frizzle, MD     Diagnoses     Essential hypertension - Primary    ICD-9-CM: 401.9 ICD-10-CM: I10    AAA (abdominal aortic aneurysm, ruptured)      ICD-9-CM: 441.3 ICD-10-CM: I71.3    Persistent atrial fibrillation     ICD-9-CM: 427.31 ICD-10-CM: I48.1       Reason for Visit     Reason for Visit History       Level of Service     PR OFFICE OUTPATIENT VISIT 51 MINUTES [76195]        All Charges for This Encounter     Code Description Service Date Service Provider Modifiers Qty    [phone] PR OFFICE OUTPATIENT VISIT 78 MINUTES 02/09/2014 Josue Hector, MD  1      AVS Reports     Date/Time Report Action User    02/09/2014 3:06 PM After Visit Summary Printed Richmond Campbell, LPN

## 2014-04-19 ENCOUNTER — Telehealth: Payer: Self-pay | Admitting: Family Medicine

## 2014-04-19 DIAGNOSIS — E1165 Type 2 diabetes mellitus with hyperglycemia: Secondary | ICD-10-CM

## 2014-04-19 DIAGNOSIS — IMO0002 Reserved for concepts with insufficient information to code with codable children: Secondary | ICD-10-CM

## 2014-04-19 DIAGNOSIS — I10 Essential (primary) hypertension: Secondary | ICD-10-CM

## 2014-04-19 DIAGNOSIS — Z79899 Other long term (current) drug therapy: Secondary | ICD-10-CM

## 2014-04-19 DIAGNOSIS — E785 Hyperlipidemia, unspecified: Secondary | ICD-10-CM

## 2014-04-19 NOTE — Telephone Encounter (Signed)
[phone]   Patient calling to talk with you regarding taking the [place] that dr pickard mentioned to him, he would like to avoid this is possible because it is too expensive please call him regarding this

## 2014-04-20 ENCOUNTER — Ambulatory Visit: Payer: PPO | Admitting: Cardiovascular Disease

## 2014-04-20 NOTE — Telephone Encounter (Signed)
Recommendations for a cheaper med?

## 2014-04-21 NOTE — Telephone Encounter (Signed)
He could try glipizide xr 5 mg poqd and recheckin 3 months.

## 2014-04-21 NOTE — Telephone Encounter (Signed)
Pt would like to try and get his bs down with diet and exercise - I explained to him the low carb - 45g per meal diet and the exercise 3-5 times per week for at least 30 minutes. Appt made for him to recheck his BW in 3 months.

## 2014-06-23 LAB — HM DIABETES EYE EXAM

## 2014-06-29 ENCOUNTER — Ambulatory Visit: Payer: PPO | Admitting: Cardiovascular Disease

## 2014-07-19 ENCOUNTER — Other Ambulatory Visit: Payer: PPO

## 2014-07-19 DIAGNOSIS — I10 Essential (primary) hypertension: Secondary | ICD-10-CM

## 2014-07-19 DIAGNOSIS — E1165 Type 2 diabetes mellitus with hyperglycemia: Secondary | ICD-10-CM

## 2014-07-19 DIAGNOSIS — IMO0002 Reserved for concepts with insufficient information to code with codable children: Secondary | ICD-10-CM

## 2014-07-19 DIAGNOSIS — Z79899 Other long term (current) drug therapy: Secondary | ICD-10-CM

## 2014-07-19 DIAGNOSIS — E785 Hyperlipidemia, unspecified: Secondary | ICD-10-CM

## 2014-07-19 LAB — CBC WITH DIFFERENTIAL/PLATELET
BASOS ABS: 0.1 10*3/uL (ref 0.0–0.1)
Basophils Relative: 1 % (ref 0–1)
EOS ABS: 0.3 10*3/uL (ref 0.0–0.7)
Eosinophils Relative: 5 % (ref 0–5)
HCT: 44.4 % (ref 39.0–52.0)
Hemoglobin: 14.2 g/dL (ref 13.0–17.0)
Lymphocytes Relative: 28 % (ref 12–46)
Lymphs Abs: 1.7 10*3/uL (ref 0.7–4.0)
MCH: 29.1 pg (ref 26.0–34.0)
MCHC: 32 g/dL (ref 30.0–36.0)
MCV: 91 fL (ref 78.0–100.0)
MONO ABS: 0.5 10*3/uL (ref 0.1–1.0)
MPV: 10.9 fL (ref 8.6–12.4)
Monocytes Relative: 9 % (ref 3–12)
Neutro Abs: 3.4 10*3/uL (ref 1.7–7.7)
Neutrophils Relative %: 57 % (ref 43–77)
PLATELETS: 172 10*3/uL (ref 150–400)
RBC: 4.88 MIL/uL (ref 4.22–5.81)
RDW: 13.6 % (ref 11.5–15.5)
WBC: 6 10*3/uL (ref 4.0–10.5)

## 2014-07-19 LAB — LIPID PANEL
CHOL/HDL RATIO: 2.4 ratio
Cholesterol: 102 mg/dL (ref 0–200)
HDL: 42 mg/dL (ref >=40)
LDL CALC: 37 mg/dL (ref 0–99)
Triglycerides: 115 mg/dL (ref <150)
VLDL: 23 mg/dL (ref 0–40)

## 2014-07-19 LAB — COMPREHENSIVE METABOLIC PANEL
ALBUMIN: 4.2 g/dL (ref 3.5–5.2)
ALT: 12 U/L (ref 0–53)
AST: 11 U/L (ref 0–37)
Alkaline Phosphatase: 50 U/L (ref 39–117)
BUN: 18 mg/dL (ref 6–23)
CALCIUM: 9.2 mg/dL (ref 8.4–10.5)
CHLORIDE: 101 meq/L (ref 96–112)
CO2: 27 meq/L (ref 19–32)
Creat: 0.84 mg/dL (ref 0.50–1.35)
Glucose, Bld: 152 mg/dL — ABNORMAL HIGH (ref 70–99)
Potassium: 4.5 mEq/L (ref 3.5–5.3)
Sodium: 138 mEq/L (ref 135–145)
TOTAL PROTEIN: 6.6 g/dL (ref 6.0–8.3)
Total Bilirubin: 0.5 mg/dL (ref 0.2–1.2)

## 2014-07-19 LAB — HEMOGLOBIN A1C
Hgb A1c MFr Bld: 6.8 % — ABNORMAL HIGH (ref <5.7)
MEAN PLASMA GLUCOSE: 148 mg/dL — AB (ref <117)

## 2014-07-21 ENCOUNTER — Ambulatory Visit (INDEPENDENT_AMBULATORY_CARE_PROVIDER_SITE_OTHER): Payer: PPO | Admitting: Family Medicine

## 2014-07-21 ENCOUNTER — Encounter: Payer: Self-pay | Admitting: Family Medicine

## 2014-07-21 VITALS — BP 122/72 | HR 64 | Temp 97.7°F | Resp 18 | Ht 68.0 in | Wt 195.0 lb

## 2014-07-21 DIAGNOSIS — I10 Essential (primary) hypertension: Secondary | ICD-10-CM

## 2014-07-21 DIAGNOSIS — R0600 Dyspnea, unspecified: Secondary | ICD-10-CM | POA: Diagnosis not present

## 2014-07-21 DIAGNOSIS — E119 Type 2 diabetes mellitus without complications: Secondary | ICD-10-CM | POA: Diagnosis not present

## 2014-07-21 DIAGNOSIS — E785 Hyperlipidemia, unspecified: Secondary | ICD-10-CM

## 2014-07-21 NOTE — Progress Notes (Signed)
Subjective:    Patient ID: Charles Bailey, male    DOB: [birth year], 69 y.o.   MRN: [id number]  HPI Patient is here today for follow-up. He does complain of some dyspnea on exertion. He had a stress test in January that was normal. Echo of the heart in January revealed no evidence of congestive heart failure. He does have a history of smoking. On examination today he is wheezing slightly. I'm concerned about possible COPD. He has not had a chest x-ray in 4 years. Patient denies any chest pain or chest pressure. He denies any polyuria, polydipsia, or blurred vision. He denies any myalgias or right upper quadrant pain. His most recent lab work as listed below: Appointment on 07/19/2014  Component Date Value Ref Range Status  . WBC 07/19/2014 6.0  4.0 - 10.5 K/uL Final  . RBC 07/19/2014 4.88  4.22 - 5.81 MIL/uL Final  . Hemoglobin 07/19/2014 14.2  13.0 - 17.0 g/dL Final  . HCT 07/19/2014 44.4  39.0 - 52.0 % Final  . MCV 07/19/2014 91.0  78.0 - 100.0 fL Final  . MCH 07/19/2014 29.1  26.0 - 34.0 pg Final  . MCHC 07/19/2014 32.0  30.0 - 36.0 g/dL Final  . RDW 07/19/2014 13.6  11.5 - 15.5 % Final  . Platelets 07/19/2014 172  150 - 400 K/uL Final  . MPV 07/19/2014 10.9  8.6 - 12.4 fL Final  . Neutrophils Relative % 07/19/2014 57  43 - 77 % Final  . Neutro Abs 07/19/2014 3.4  1.7 - 7.7 K/uL Final  . Lymphocytes Relative 07/19/2014 28  12 - 46 % Final  . Lymphs Abs 07/19/2014 1.7  0.7 - 4.0 K/uL Final  . Monocytes Relative 07/19/2014 9  3 - 12 % Final  . Monocytes Absolute 07/19/2014 0.5  0.1 - 1.0 K/uL Final  . Eosinophils Relative 07/19/2014 5  0 - 5 % Final  . Eosinophils Absolute 07/19/2014 0.3  0.0 - 0.7 K/uL Final  . Basophils Relative 07/19/2014 1  0 - 1 % Final  . Basophils Absolute 07/19/2014 0.1  0.0 - 0.1 K/uL Final  . Smear Review 07/19/2014 Criteria for review not met   Final  . Sodium 07/19/2014 138  135 - 145 mEq/L Final  . Potassium 07/19/2014 4.5  3.5 - 5.3 mEq/L Final  .  Chloride 07/19/2014 101  96 - 112 mEq/L Final  . CO2 07/19/2014 27  19 - 32 mEq/L Final  . Glucose, Bld 07/19/2014 152* 70 - 99 mg/dL Final  . BUN 07/19/2014 18  6 - 23 mg/dL Final  . Creat 07/19/2014 0.84  0.50 - 1.35 mg/dL Final  . Total Bilirubin 07/19/2014 0.5  0.2 - 1.2 mg/dL Final  . Alkaline Phosphatase 07/19/2014 50  39 - 117 U/L Final  . AST 07/19/2014 11  0 - 37 U/L Final  . ALT 07/19/2014 12  0 - 53 U/L Final  . Total Protein 07/19/2014 6.6  6.0 - 8.3 g/dL Final  . Albumin 07/19/2014 4.2  3.5 - 5.2 g/dL Final  . Calcium 07/19/2014 9.2  8.4 - 10.5 mg/dL Final  . Cholesterol 07/19/2014 102  0 - 200 mg/dL Final   Comment: ATP III Classification:       < 200        mg/dL        Desirable      200 - 239     mg/dL        Borderline High      >=  240        mg/dL        High     . Triglycerides 07/19/2014 115  <150 mg/dL Final  . HDL 07/19/2014 42  >=40 mg/dL Final  . Total CHOL/HDL Ratio 07/19/2014 2.4   Final  . VLDL 07/19/2014 23  0 - 40 mg/dL Final  . LDL Cholesterol 07/19/2014 37  0 - 99 mg/dL Final   Comment:   Total Cholesterol/HDL Ratio:CHD Risk                        Coronary Heart Disease Risk Table                                        Men       Women          1/2 Average Risk              3.4        3.3              Average Risk              5.0        4.4           2X Average Risk              9.6        7.1           3X Average Risk             23.4       11.0 Use the calculated Patient Ratio above and the CHD Risk table  to determine the patient's CHD Risk. ATP III Classification (LDL):       < 100        mg/dL         Optimal      100 - 129     mg/dL         Near or Above Optimal      130 - 159     mg/dL         Borderline High      160 - 189     mg/dL         High       > 190        mg/dL         Very High     . Hgb A1c MFr Bld 07/19/2014 6.8* <5.7 % Final   Comment:                                                                        According to  the ADA Clinical Practice Recommendations for 2011, when HbA1c is used as a screening test:     >=6.5%   Diagnostic of Diabetes Mellitus            (if abnormal result is confirmed)   5.7-6.4%   Increased risk of developing Diabetes Mellitus   References:Diagnosis and Classification of Diabetes Mellitus,Diabetes YMEB,5830,94(MHWKG 1):S62-S69 and Standards of Medical Care in  Diabetes - 2011,Diabetes Care,2011,34 (Suppl 1):S11-S61.     . Mean Plasma Glucose 07/19/2014 148* <117 mg/dL Final    Past Medical History  Diagnosis Date  . Hyperlipidemia   . Hypertension   . Diabetes mellitus   . Adenomatous colon polyp 2003   Past Surgical History  Procedure Laterality Date  . Abdominal aortic aneurysm repair  2007  . Inguinal hernia repair  1968    right  . Cardioversion N/A 03/29/2014    Procedure: CARDIOVERSION;  Surgeon: Peter C Nishan, MD;  Location: [facility] ENDOSCOPY;  Service: Cardiovascular;  Laterality: N/A;   Current Outpatient Prescriptions on File Prior to Visit  Medication Sig Dispense Refill  . amLODipine (NORVASC) 5 MG tablet Take 1 tablet (5 mg total) by mouth daily. 90 tablet 3  . Cholecalciferol (VITAMIN D3) 1000 UNITS CAPS Take 2,000 Units by mouth daily.     . cloNIDine (CATAPRES) 0.2 MG tablet Take 1 tablet (0.2 mg total) by mouth 2 (two) times daily. 180 tablet 3  . dabigatran (PRADAXA) 150 MG CAPS capsule Take 1 capsule (150 mg total) by mouth 2 (two) times daily. 180 capsule 3  . fish oil-omega-3 fatty acids 1000 MG capsule Take 1,200 mg by mouth daily.    . lisinopril (PRINIVIL,ZESTRIL) 40 MG tablet Take 1 tablet (40 mg total) by mouth daily. 90 tablet 3  . metFORMIN (GLUCOPHAGE) 1000 MG tablet Take 1 tablet (1,000 mg total) by mouth 2 (two) times daily with a meal. 180 tablet 3  . metoprolol (LOPRESSOR) 50 MG tablet Take 1 tablet (50 mg total) by mouth 2 (two) times daily. 180 tablet 3  . pravastatin (PRAVACHOL) 40 MG tablet Take 1 tablet (40 mg total) by  mouth daily. 90 tablet 3   No current facility-administered medications on file prior to visit.   No Known Allergies History   Social History  . Marital Status: Married    Spouse Name: N/A  . Number of Children: N/A  . Years of Education: N/A   Occupational History  . Not on file.   Social History Main Topics  . Smoking status: Former Smoker -- 15 years    Types: Cigarettes    Quit date: 03/03/2005  . Smokeless tobacco: Never Used  . Alcohol Use: 2.4 oz/week    4 Cans of beer per week  . Drug Use: No  . Sexual Activity: Not on file   Other Topics Concern  . Not on file   Social History Narrative     Review of Systems  All other systems reviewed and are negative.      Objective:   Physical Exam  Constitutional: He appears well-developed and well-nourished.  Cardiovascular: Normal rate, regular rhythm and normal heart sounds.   No murmur heard. Pulmonary/Chest: Effort normal and breath sounds normal. No respiratory distress. He has no wheezes. He has no rales. He exhibits no tenderness.  Abdominal: Soft. Bowel sounds are normal. He exhibits no distension and no mass. There is no tenderness. There is no rebound and no guarding.  Musculoskeletal: He exhibits no edema.  Vitals reviewed.         Assessment & Plan:  Diabetes mellitus type II, controlled  Hyperlipidemia  Essential hypertension  Dyspnea - Plan: DG Chest 2 View  I believe the patient's dyspnea is likely due to deconditioning as well as maybe some mild COPD. I will schedule the patient for a chest x-ray to rule out pathologic lesions in the lung. I recommended increasing aerobic exercise.   His blood pressures well controlled. His cholesterol is well controlled. His heart rate is controlled. He is appropriately anticoagulated. Hemoglobin A1c would be better if it were less than 6.5. I would like to achieve this by increasing aerobic exercise gradually to an ultimate goal of 30 minutes a day 5 days a  week. 

## 2014-07-27 ENCOUNTER — Ambulatory Visit: Payer: PPO | Admitting: Cardiovascular Disease

## 2015-02-15 ENCOUNTER — Encounter: Payer: Self-pay | Admitting: Family Medicine

## 2015-02-15 ENCOUNTER — Other Ambulatory Visit: Payer: Self-pay | Admitting: Family Medicine

## 2015-02-15 DIAGNOSIS — Z23 Encounter for immunization: Secondary | ICD-10-CM

## 2015-02-15 DIAGNOSIS — R009 Unspecified abnormalities of heart beat: Secondary | ICD-10-CM

## 2015-02-15 DIAGNOSIS — Z0000 Encounter for general adult medical examination without abnormal findings: Secondary | ICD-10-CM

## 2015-02-15 MED ORDER — LISINOPRIL 40 MG PO TABS: 40.0000 mg | ORAL_TABLET | Freq: Every day | ORAL | Status: DC

## 2015-02-15 MED ORDER — AMLODIPINE BESYLATE 5 MG PO TABS: 5.0000 mg | ORAL_TABLET | Freq: Every day | ORAL | Status: DC

## 2015-02-15 NOTE — Telephone Encounter (Signed)
Medication refill for one time only.  Patient needs to be seen.  Letter sent for patient to call and schedule 

## 2015-03-30 ENCOUNTER — Other Ambulatory Visit: Payer: Self-pay | Admitting: Family Medicine

## 2015-03-30 DIAGNOSIS — R009 Unspecified abnormalities of heart beat: Secondary | ICD-10-CM

## 2015-03-30 DIAGNOSIS — Z23 Encounter for immunization: Secondary | ICD-10-CM

## 2015-03-30 DIAGNOSIS — Z0000 Encounter for general adult medical examination without abnormal findings: Secondary | ICD-10-CM

## 2015-03-30 MED ORDER — CLONIDINE HCL 0.2 MG PO TABS: 0.2000 mg | ORAL_TABLET | Freq: Two times a day (BID) | ORAL | Status: DC

## 2015-04-05 ENCOUNTER — Other Ambulatory Visit: Payer: Self-pay | Admitting: Family Medicine

## 2015-04-05 DIAGNOSIS — Z23 Encounter for immunization: Secondary | ICD-10-CM

## 2015-04-05 DIAGNOSIS — Z0000 Encounter for general adult medical examination without abnormal findings: Secondary | ICD-10-CM

## 2015-04-05 DIAGNOSIS — R009 Unspecified abnormalities of heart beat: Secondary | ICD-10-CM

## 2015-04-05 MED ORDER — PRAVASTATIN SODIUM 40 MG PO TABS: 40.0000 mg | ORAL_TABLET | Freq: Every day | ORAL | Status: DC

## 2015-05-22 ENCOUNTER — Encounter: Payer: Self-pay | Admitting: Family Medicine

## 2015-05-22 ENCOUNTER — Other Ambulatory Visit: Payer: Self-pay | Admitting: Family Medicine

## 2015-05-22 DIAGNOSIS — Z23 Encounter for immunization: Secondary | ICD-10-CM

## 2015-05-22 DIAGNOSIS — R009 Unspecified abnormalities of heart beat: Secondary | ICD-10-CM

## 2015-05-22 DIAGNOSIS — Z0000 Encounter for general adult medical examination without abnormal findings: Secondary | ICD-10-CM

## 2015-05-22 MED ORDER — METFORMIN HCL 1000 MG PO TABS: 1000.0000 mg | ORAL_TABLET | Freq: Two times a day (BID) | ORAL | Status: DC

## 2015-05-23 ENCOUNTER — Other Ambulatory Visit: Payer: Self-pay | Admitting: *Deleted

## 2015-05-23 DIAGNOSIS — R009 Unspecified abnormalities of heart beat: Secondary | ICD-10-CM

## 2015-05-23 DIAGNOSIS — Z23 Encounter for immunization: Secondary | ICD-10-CM

## 2015-05-23 DIAGNOSIS — Z0000 Encounter for general adult medical examination without abnormal findings: Secondary | ICD-10-CM

## 2015-05-23 MED ORDER — METFORMIN HCL 1000 MG PO TABS: 1000.0000 mg | ORAL_TABLET | Freq: Two times a day (BID) | ORAL | Status: DC

## 2015-05-23 NOTE — Telephone Encounter (Signed)
Received fax requesting refill on Metfortmin.   Refill appropriate and filled per protocol.

## 2015-06-14 ENCOUNTER — Other Ambulatory Visit: Payer: Self-pay | Admitting: *Deleted

## 2015-06-14 DIAGNOSIS — I480 Paroxysmal atrial fibrillation: Secondary | ICD-10-CM

## 2015-06-14 MED ORDER — METOPROLOL TARTRATE 50 MG PO TABS: 50.0000 mg | ORAL_TABLET | Freq: Two times a day (BID) | ORAL | Status: DC

## 2015-06-14 NOTE — Telephone Encounter (Signed)
Received fax requesting refill on Metoprolol.   Refill appropriate and filled per protocol. 

## 2015-06-19 ENCOUNTER — Other Ambulatory Visit: Payer: PPO

## 2015-06-22 ENCOUNTER — Other Ambulatory Visit: Payer: Self-pay

## 2015-06-22 MED ORDER — DABIGATRAN ETEXILATE MESYLATE 150 MG PO CAPS: 150.0000 mg | ORAL_CAPSULE | Freq: Two times a day (BID) | ORAL | Status: DC

## 2015-06-26 ENCOUNTER — Other Ambulatory Visit: Payer: PPO

## 2015-06-26 ENCOUNTER — Other Ambulatory Visit: Payer: Self-pay | Admitting: Family Medicine

## 2015-06-26 DIAGNOSIS — Z125 Encounter for screening for malignant neoplasm of prostate: Secondary | ICD-10-CM | POA: Diagnosis not present

## 2015-06-26 DIAGNOSIS — E1165 Type 2 diabetes mellitus with hyperglycemia: Secondary | ICD-10-CM

## 2015-06-26 DIAGNOSIS — Z79899 Other long term (current) drug therapy: Secondary | ICD-10-CM

## 2015-06-26 DIAGNOSIS — I10 Essential (primary) hypertension: Secondary | ICD-10-CM

## 2015-06-26 DIAGNOSIS — E785 Hyperlipidemia, unspecified: Secondary | ICD-10-CM

## 2015-06-26 DIAGNOSIS — IMO0002 Reserved for concepts with insufficient information to code with codable children: Secondary | ICD-10-CM

## 2015-06-26 LAB — CBC WITH DIFFERENTIAL/PLATELET
BASOS ABS: 62 {cells}/uL (ref 0–200)
Basophils Relative: 1 %
EOS ABS: 310 {cells}/uL (ref 15–500)
EOS PCT: 5 %
HEMATOCRIT: 43.6 % (ref 38.5–50.0)
HEMOGLOBIN: 14.2 g/dL (ref 13.0–17.0)
LYMPHS ABS: 1674 {cells}/uL (ref 850–3900)
Lymphocytes Relative: 27 %
MCH: 29.3 pg (ref 27.0–33.0)
MCHC: 32.6 g/dL (ref 32.0–36.0)
MCV: 89.9 fL (ref 80.0–100.0)
MONO ABS: 744 {cells}/uL (ref 200–950)
MPV: 10.8 fL (ref 7.5–12.5)
Monocytes Relative: 12 %
NEUTROS ABS: 3410 {cells}/uL (ref 1500–7800)
Neutrophils Relative %: 55 %
Platelets: 170 10*3/uL (ref 140–400)
RBC: 4.85 MIL/uL (ref 4.20–5.80)
RDW: 13.5 % (ref 11.0–15.0)
WBC: 6.2 10*3/uL (ref 3.8–10.8)

## 2015-06-26 LAB — COMPREHENSIVE METABOLIC PANEL
ALBUMIN: 4 g/dL (ref 3.6–5.1)
ALT: 9 U/L (ref 9–46)
AST: 10 U/L (ref 10–35)
Alkaline Phosphatase: 56 U/L (ref 40–115)
BUN: 19 mg/dL (ref 7–25)
CALCIUM: 9.2 mg/dL (ref 8.6–10.3)
CHLORIDE: 101 mmol/L (ref 98–110)
CO2: 27 mmol/L (ref 20–31)
CREATININE: 0.96 mg/dL (ref 0.70–1.25)
Glucose, Bld: 147 mg/dL — ABNORMAL HIGH (ref 70–99)
Potassium: 4.7 mmol/L (ref 3.5–5.3)
SODIUM: 137 mmol/L (ref 135–146)
TOTAL PROTEIN: 6.6 g/dL (ref 6.1–8.1)
Total Bilirubin: 0.5 mg/dL (ref 0.2–1.2)

## 2015-06-26 LAB — LIPID PANEL
CHOLESTEROL: 111 mg/dL — AB (ref 125–200)
HDL: 40 mg/dL (ref >=40)
LDL Cholesterol: 35 mg/dL (ref <130)
TRIGLYCERIDES: 181 mg/dL — AB (ref <150)
Total CHOL/HDL Ratio: 2.8 Ratio (ref <=5.0)
VLDL: 36 mg/dL — AB (ref <30)

## 2015-06-26 LAB — HEMOGLOBIN A1C
Hgb A1c MFr Bld: 7.3 % — ABNORMAL HIGH (ref <5.7)
Mean Plasma Glucose: 163 mg/dL

## 2015-06-29 ENCOUNTER — Encounter: Payer: Self-pay | Admitting: Family Medicine

## 2015-06-29 ENCOUNTER — Ambulatory Visit (INDEPENDENT_AMBULATORY_CARE_PROVIDER_SITE_OTHER): Payer: PPO | Admitting: Family Medicine

## 2015-06-29 VITALS — BP 130/80 | HR 68 | Temp 98.2°F | Resp 16 | Ht 68.0 in | Wt 196.0 lb

## 2015-06-29 DIAGNOSIS — Z0000 Encounter for general adult medical examination without abnormal findings: Secondary | ICD-10-CM | POA: Diagnosis not present

## 2015-06-29 DIAGNOSIS — I4891 Unspecified atrial fibrillation: Secondary | ICD-10-CM

## 2015-06-29 DIAGNOSIS — I10 Essential (primary) hypertension: Secondary | ICD-10-CM | POA: Diagnosis not present

## 2015-06-29 DIAGNOSIS — E119 Type 2 diabetes mellitus without complications: Secondary | ICD-10-CM | POA: Diagnosis not present

## 2015-06-29 DIAGNOSIS — E785 Hyperlipidemia, unspecified: Secondary | ICD-10-CM

## 2015-06-29 NOTE — Progress Notes (Signed)
Subjective:    Patient ID: Charles Bailey, male    DOB: 03-05-1945, 70 y.o.   MRN: [id number]  HPI  Here today for complete physical exam.   Last colonoscopy 02/2011 was significant for 5 polyps. Recommendations were made to repeat colonoscopy in 3 years and therefore he is overdue.  He is also due for the shingles vaccine as well as hepatitis C screening. Diabetic eye exam is up-to-date.  His wife has been battling brain cancer. He admits that due to the stress of this they have been more liberal in her diet and been eating ice cream almost on a daily basis. Because of this reason he declines a colonoscopy as he is just too busy at the present time caring for her. He would like to try diet and exercise to address his sugars. Immunization History  Administered Date(s) Administered  . Influenza,inj,Quad PF,36+ Mos 12/03/2012, 12/23/2013  . Pneumococcal Conjugate-13 12/03/2012  . Pneumococcal Polysaccharide-23 12/23/2013  . Td 02/14/1997, 04/18/2011  . Tdap 04/18/2011    Past Medical History  Diagnosis Date  . Hyperlipidemia   . Hypertension   . Diabetes mellitus   . Adenomatous colon polyp 2003   Past Surgical History  Procedure Laterality Date  . Abdominal aortic aneurysm repair  2007  . Inguinal hernia repair  1968    right  . Cardioversion N/A 03/29/2014    Procedure: CARDIOVERSION;  Surgeon: Josue Hector, MD;  Location: [facility] ENDOSCOPY;  Service: Cardiovascular;  Laterality: N/A;   Current Outpatient Prescriptions on File Prior to Visit  Medication Sig Dispense Refill  . amLODipine (NORVASC) 5 MG tablet Take 1 tablet (5 mg total) by mouth daily. 90 tablet 3  . Cholecalciferol (VITAMIN D3) 1000 UNITS CAPS Take 2,000 Units by mouth daily.     . cloNIDine (CATAPRES) 0.2 MG tablet Take 1 tablet (0.2 mg total) by mouth 2 (two) times daily. 180 tablet 0  . dabigatran (PRADAXA) 150 MG CAPS capsule Take 1 capsule (150 mg total) by mouth 2 (two) times daily. 180 capsule 0  . fish  oil-omega-3 fatty acids 1000 MG capsule Take 1,200 mg by mouth daily.    Marland Kitchen lisinopril (PRINIVIL,ZESTRIL) 40 MG tablet Take 1 tablet (40 mg total) by mouth daily. 90 tablet 3  . metFORMIN (GLUCOPHAGE) 1000 MG tablet Take 1 tablet (1,000 mg total) by mouth 2 (two) times daily with a meal. 180 tablet 0  . metoprolol (LOPRESSOR) 50 MG tablet Take 1 tablet (50 mg total) by mouth 2 (two) times daily. 180 tablet 3  . pravastatin (PRAVACHOL) 40 MG tablet Take 1 tablet (40 mg total) by mouth daily. 90 tablet 3   No current facility-administered medications on file prior to visit.   No Known Allergies Social History   Social History  . Marital Status: Married    Spouse Name: N/A  . Number of Children: N/A  . Years of Education: N/A   Occupational History  . Not on file.   Social History Main Topics  . Smoking status: Former Smoker -- 15 years    Types: Cigarettes    Quit date: 03/03/2005  . Smokeless tobacco: Never Used  . Alcohol Use: 2.4 oz/week    4 Cans of beer per week  . Drug Use: No  . Sexual Activity: Not on file   Other Topics Concern  . Not on file   Social History Narrative   Family History  Problem Relation Age of Onset  . Heart disease Father   .  Diabetes Paternal Aunt   . Heart disease Paternal Uncle   . Heart disease Paternal Grandfather   . Heart failure Mother     died in sleep at 53 years    Appointment on 06/26/2015  Component Date Value Ref Range Status  . WBC 06/26/2015 6.2  3.8 - 10.8 K/uL Final  . RBC 06/26/2015 4.85  4.20 - 5.80 MIL/uL Final  . Hemoglobin 06/26/2015 14.2  13.0 - 17.0 g/dL Final  . HCT 06/26/2015 43.6  38.5 - 50.0 % Final  . MCV 06/26/2015 89.9  80.0 - 100.0 fL Final  . MCH 06/26/2015 29.3  27.0 - 33.0 pg Final  . MCHC 06/26/2015 32.6  32.0 - 36.0 g/dL Final  . RDW 06/26/2015 13.5  11.0 - 15.0 % Final  . Platelets 06/26/2015 170  140 - 400 K/uL Final  . MPV 06/26/2015 10.8  7.5 - 12.5 fL Final  . Neutro Abs 06/26/2015 3410  1500 -  7800 cells/uL Final  . Lymphs Abs 06/26/2015 1674  850 - 3900 cells/uL Final  . Monocytes Absolute 06/26/2015 744  200 - 950 cells/uL Final  . Eosinophils Absolute 06/26/2015 310  15 - 500 cells/uL Final  . Basophils Absolute 06/26/2015 62  0 - 200 cells/uL Final  . Neutrophils Relative % 06/26/2015 55   Final  . Lymphocytes Relative 06/26/2015 27   Final  . Monocytes Relative 06/26/2015 12   Final  . Eosinophils Relative 06/26/2015 5   Final  . Basophils Relative 06/26/2015 1   Final  . Smear Review 06/26/2015 Criteria for review not met   Final   ** Please note change in unit of measure and reference range(s). **  . Sodium 06/26/2015 137  135 - 146 mmol/L Final  . Potassium 06/26/2015 4.7  3.5 - 5.3 mmol/L Final  . Chloride 06/26/2015 101  98 - 110 mmol/L Final  . CO2 06/26/2015 27  20 - 31 mmol/L Final  . Glucose, Bld 06/26/2015 147* 70 - 99 mg/dL Final  . BUN 06/26/2015 19  7 - 25 mg/dL Final  . Creat 06/26/2015 0.96  0.70 - 1.25 mg/dL Final   Comment:   For patients > or = 68 years of age: The upper reference limit for Creatinine is approximately 13% higher for people identified as African-American.     . Total Bilirubin 06/26/2015 0.5  0.2 - 1.2 mg/dL Final  . Alkaline Phosphatase 06/26/2015 56  40 - 115 U/L Final  . AST 06/26/2015 10  10 - 35 U/L Final  . ALT 06/26/2015 9  9 - 46 U/L Final  . Total Protein 06/26/2015 6.6  6.1 - 8.1 g/dL Final  . Albumin 06/26/2015 4.0  3.6 - 5.1 g/dL Final  . Calcium 06/26/2015 9.2  8.6 - 10.3 mg/dL Final  . Cholesterol 06/26/2015 111* 125 - 200 mg/dL Final  . Triglycerides 06/26/2015 181* <150 mg/dL Final  . HDL 06/26/2015 40  >=40 mg/dL Final  . Total CHOL/HDL Ratio 06/26/2015 2.8  <=5.0 Ratio Final  . VLDL 06/26/2015 36* <30 mg/dL Final  . LDL Cholesterol 06/26/2015 35  <130 mg/dL Final   Comment:   Total Cholesterol/HDL Ratio:CHD Risk                        Coronary Heart Disease Risk Table  Men       Women          1/2 Average Risk              3.4        3.3              Average Risk              5.0        4.4           2X Average Risk              9.6        7.1           3X Average Risk             23.4       11.0 Use the calculated Patient Ratio above and the CHD Risk table  to determine the patient's CHD Risk.   . Hgb A1c MFr Bld 06/26/2015 7.3* <5.7 % Final   Comment:   For someone without known diabetes, a hemoglobin A1c value of 6.5% or greater indicates that they may have diabetes and this should be confirmed with a follow-up test.   For someone with known diabetes, a value <7% indicates that their diabetes is well controlled and a value greater than or equal to 7% indicates suboptimal control. A1c targets should be individualized based on duration of diabetes, age, comorbid conditions, and other considerations.   Currently, no consensus exists for use of hemoglobin A1c for diagnosis of diabetes for children.     . Mean Plasma Glucose 06/26/2015 163   Final      Review of Systems  All other systems reviewed and are negative.      Objective:   Physical Exam  Constitutional: He is oriented to person, place, and time. He appears well-developed and well-nourished. No distress.  HENT:  Head: Normocephalic and atraumatic.  Right Ear: External ear normal.  Left Ear: External ear normal.  Nose: Nose normal.  Mouth/Throat: Oropharynx is clear and moist. No oropharyngeal exudate.  Eyes: Conjunctivae and EOM are normal. Pupils are equal, round, and reactive to light. Right eye exhibits no discharge. Left eye exhibits no discharge. No scleral icterus.  Neck: Normal range of motion. Neck supple. No JVD present. No tracheal deviation present. No thyromegaly present.  Cardiovascular: Normal rate, normal heart sounds and intact distal pulses.  An irregularly irregular rhythm present. Exam reveals no gallop and no friction rub.   No murmur heard. Pulmonary/Chest:  Effort normal and breath sounds normal. No stridor. No respiratory distress. He has no wheezes. He has no rales. He exhibits no tenderness.  Abdominal: Soft. Bowel sounds are normal. He exhibits no distension and no mass. There is no tenderness. There is no rebound and no guarding.  Genitourinary: Rectum normal and prostate normal.  Musculoskeletal: Normal range of motion. He exhibits no edema or tenderness.  Lymphadenopathy:    He has no cervical adenopathy.  Neurological: He is alert and oriented to person, place, and time. He has normal reflexes. He displays normal reflexes. No cranial nerve deficit. He exhibits normal muscle tone. Coordination normal.  Skin: Skin is warm. No rash noted. He is not diaphoretic. No erythema. No pallor.  Psychiatric: He has a normal mood and affect. His behavior is normal. Judgment and thought content normal.  Vitals reviewed.         Assessment & Plan:  Controlled type 2 diabetes  mellitus without complication, without long-term current use of insulin ([place])  Essential hypertension  Hyperlipidemia  Atrial fibrillation, unspecified  Routine general medical examination at a health care facility  We will defer the colonoscopy at the present time. Readdress next year. At the present time he is busy caring for his wife is dying from cancer. Blood pressure is excellent. Glycemic control is suboptimal but he would like to try to address this with diet exercise and weight loss. Cholesterol is excellent. I will add a PSA to his lab work. The remainder of his exam is normal. He declines hepatitis C screening

## 2015-06-30 LAB — PSA: PSA: 3.57 ng/mL (ref <=4.00)

## 2015-07-04 ENCOUNTER — Encounter: Payer: Self-pay | Admitting: Family Medicine

## 2015-07-14 ENCOUNTER — Other Ambulatory Visit: Payer: Self-pay | Admitting: Family Medicine

## 2015-07-14 DIAGNOSIS — Z0000 Encounter for general adult medical examination without abnormal findings: Secondary | ICD-10-CM

## 2015-07-14 DIAGNOSIS — R009 Unspecified abnormalities of heart beat: Secondary | ICD-10-CM

## 2015-07-14 DIAGNOSIS — Z23 Encounter for immunization: Secondary | ICD-10-CM

## 2015-07-14 MED ORDER — CLONIDINE HCL 0.2 MG PO TABS: 0.2000 mg | ORAL_TABLET | Freq: Two times a day (BID) | ORAL | Status: DC

## 2015-07-14 NOTE — Telephone Encounter (Signed)
Medication called/sent to requested pharmacy  

## 2015-08-16 ENCOUNTER — Other Ambulatory Visit: Payer: Self-pay | Admitting: Family Medicine

## 2015-08-16 DIAGNOSIS — R009 Unspecified abnormalities of heart beat: Secondary | ICD-10-CM

## 2015-08-16 DIAGNOSIS — Z23 Encounter for immunization: Secondary | ICD-10-CM

## 2015-08-16 DIAGNOSIS — Z0000 Encounter for general adult medical examination without abnormal findings: Secondary | ICD-10-CM

## 2015-08-16 MED ORDER — METFORMIN HCL 1000 MG PO TABS: 1000.0000 mg | ORAL_TABLET | Freq: Two times a day (BID) | ORAL | 1 refills | Status: DC

## 2015-08-16 NOTE — Telephone Encounter (Signed)
Medication called/sent to requested pharmacy  

## 2015-08-18 ENCOUNTER — Other Ambulatory Visit: Payer: Self-pay | Admitting: Family Medicine

## 2015-08-18 DIAGNOSIS — Z23 Encounter for immunization: Secondary | ICD-10-CM

## 2015-08-18 DIAGNOSIS — Z0000 Encounter for general adult medical examination without abnormal findings: Secondary | ICD-10-CM

## 2015-08-18 DIAGNOSIS — R009 Unspecified abnormalities of heart beat: Secondary | ICD-10-CM

## 2015-08-18 MED ORDER — METFORMIN HCL 1000 MG PO TABS: 1000.0000 mg | ORAL_TABLET | Freq: Two times a day (BID) | ORAL | 3 refills | Status: DC

## 2015-08-18 NOTE — Telephone Encounter (Signed)
Medication refilled per protocol. 

## 2015-08-23 ENCOUNTER — Other Ambulatory Visit: Payer: Self-pay | Admitting: Family Medicine

## 2015-08-23 DIAGNOSIS — Z0000 Encounter for general adult medical examination without abnormal findings: Secondary | ICD-10-CM

## 2015-08-23 DIAGNOSIS — Z23 Encounter for immunization: Secondary | ICD-10-CM

## 2015-08-23 DIAGNOSIS — R009 Unspecified abnormalities of heart beat: Secondary | ICD-10-CM

## 2015-08-23 MED ORDER — METFORMIN HCL 1000 MG PO TABS: 1000.0000 mg | ORAL_TABLET | Freq: Two times a day (BID) | ORAL | 3 refills | Status: DC

## 2015-09-27 ENCOUNTER — Other Ambulatory Visit: Payer: Self-pay | Admitting: Family Medicine

## 2015-09-27 MED ORDER — DABIGATRAN ETEXILATE MESYLATE 150 MG PO CAPS: 150.0000 mg | ORAL_CAPSULE | Freq: Two times a day (BID) | ORAL | 3 refills | Status: DC

## 2015-10-09 DIAGNOSIS — Z961 Presence of intraocular lens: Secondary | ICD-10-CM | POA: Diagnosis not present

## 2015-10-09 DIAGNOSIS — H2511 Age-related nuclear cataract, right eye: Secondary | ICD-10-CM | POA: Diagnosis not present

## 2015-10-09 DIAGNOSIS — H524 Presbyopia: Secondary | ICD-10-CM | POA: Diagnosis not present

## 2015-10-09 DIAGNOSIS — H25041 Posterior subcapsular polar age-related cataract, right eye: Secondary | ICD-10-CM | POA: Diagnosis not present

## 2015-10-18 DIAGNOSIS — H2511 Age-related nuclear cataract, right eye: Secondary | ICD-10-CM | POA: Diagnosis not present

## 2015-10-18 DIAGNOSIS — H25041 Posterior subcapsular polar age-related cataract, right eye: Secondary | ICD-10-CM | POA: Diagnosis not present

## 2015-12-22 ENCOUNTER — Telehealth: Payer: Self-pay | Admitting: Family Medicine

## 2015-12-22 NOTE — Telephone Encounter (Signed)
We do not carry samples of Pradaxa - is it ok for him to cut down to once a day or should he contact his cardiologists?

## 2015-12-22 NOTE — Telephone Encounter (Signed)
Must be taken twice a day or switched to something else. Can taking xarelto 20 qday if we have samples until the 1st of the year until he can switch back to pradaxa OR call his cardiologist.

## 2015-12-22 NOTE — Telephone Encounter (Signed)
Patient left vm stating he has left 2 message this week Tuesday and Wednesday on Dr. Samella Parr nurse vm--he is in the donut hole and he is requesting if we have any samples of pradaxa. He needs enough for 10 days which he states is 20 pills. He can't afford amount they are trying to charge. He would like you to call him back to discuss if we don't have any samples him cutting back to once a day instead of twice a day.  CB# [phone]

## 2015-12-22 NOTE — Telephone Encounter (Signed)
Patient aware of providers recommendations and will stop by and pick up samples of xarelto if cards does not have pradaxa samples. Samples left up front.

## 2016-01-01 ENCOUNTER — Ambulatory Visit (INDEPENDENT_AMBULATORY_CARE_PROVIDER_SITE_OTHER): Payer: PPO | Admitting: Family Medicine

## 2016-01-01 ENCOUNTER — Encounter: Payer: Self-pay | Admitting: Family Medicine

## 2016-01-01 VITALS — BP 150/90 | HR 66 | Temp 98.6°F | Resp 18 | Ht 68.0 in | Wt 193.0 lb

## 2016-01-01 DIAGNOSIS — E119 Type 2 diabetes mellitus without complications: Secondary | ICD-10-CM | POA: Diagnosis not present

## 2016-01-01 DIAGNOSIS — Z23 Encounter for immunization: Secondary | ICD-10-CM

## 2016-01-01 DIAGNOSIS — R009 Unspecified abnormalities of heart beat: Secondary | ICD-10-CM | POA: Diagnosis not present

## 2016-01-01 LAB — COMPLETE METABOLIC PANEL WITH GFR
ALT: 10 U/L (ref 9–46)
AST: 14 U/L (ref 10–35)
Albumin: 4.4 g/dL (ref 3.6–5.1)
Alkaline Phosphatase: 57 U/L (ref 40–115)
BILIRUBIN TOTAL: 0.5 mg/dL (ref 0.2–1.2)
BUN: 15 mg/dL (ref 7–25)
CHLORIDE: 101 mmol/L (ref 98–110)
CO2: 28 mmol/L (ref 20–31)
Calcium: 9.3 mg/dL (ref 8.6–10.3)
Creat: 1.03 mg/dL (ref 0.70–1.25)
GFR, EST AFRICAN AMERICAN: 86 mL/min (ref >=60)
GFR, EST NON AFRICAN AMERICAN: 74 mL/min (ref >=60)
Glucose, Bld: 130 mg/dL — ABNORMAL HIGH (ref 70–99)
Potassium: 4.2 mmol/L (ref 3.5–5.3)
Sodium: 139 mmol/L (ref 135–146)
Total Protein: 7 g/dL (ref 6.1–8.1)

## 2016-01-01 LAB — CBC WITH DIFFERENTIAL/PLATELET
BASOS ABS: 0 {cells}/uL (ref 0–200)
Basophils Relative: 0 %
EOS ABS: 180 {cells}/uL (ref 15–500)
Eosinophils Relative: 4 %
HCT: 44.6 % (ref 38.5–50.0)
Hemoglobin: 14.5 g/dL (ref 13.0–17.0)
Lymphocytes Relative: 30 %
Lymphs Abs: 1350 cells/uL (ref 850–3900)
MCH: 29.3 pg (ref 27.0–33.0)
MCHC: 32.5 g/dL (ref 32.0–36.0)
MCV: 90.1 fL (ref 80.0–100.0)
MONOS PCT: 24 %
MPV: 11.2 fL (ref 7.5–12.5)
Monocytes Absolute: 1080 cells/uL — ABNORMAL HIGH (ref 200–950)
NEUTROS PCT: 42 %
Neutro Abs: 1890 cells/uL (ref 1500–7800)
PLATELETS: 174 10*3/uL (ref 140–400)
RBC: 4.95 MIL/uL (ref 4.20–5.80)
RDW: 13.7 % (ref 11.0–15.0)
WBC: 4.5 10*3/uL (ref 3.8–10.8)

## 2016-01-01 LAB — LIPID PANEL
CHOLESTEROL: 102 mg/dL (ref <200)
HDL: 37 mg/dL — ABNORMAL LOW (ref >40)
LDL CALC: 43 mg/dL (ref <100)
TRIGLYCERIDES: 111 mg/dL (ref <150)
Total CHOL/HDL Ratio: 2.8 Ratio (ref <5.0)
VLDL: 22 mg/dL (ref <30)

## 2016-01-01 MED ORDER — AMLODIPINE BESYLATE 10 MG PO TABS: 10.0000 mg | ORAL_TABLET | Freq: Every day | ORAL | 3 refills | Status: DC

## 2016-01-01 NOTE — Progress Notes (Signed)
Subjective:    Patient ID: Charles Bailey, male    DOB: [birth year], 70 y.o.   MRN: [id number]  HPI  06/29/15 Here today for complete physical exam.   Last colonoscopy 02/2011 was significant for 5 polyps. Recommendations were made to repeat colonoscopy in 3 years and therefore he is overdue.  He is also due for the shingles vaccine as well as hepatitis C screening. Diabetic eye exam is up-to-date.  His wife has been battling brain cancer. He admits that due to the stress of this they have been more liberal in her diet and been eating ice cream almost on a daily basis. Because of this reason he declines a colonoscopy as he is just too busy at the present time caring for her. He would like to try diet and exercise to address his sugars.  AT that time, my plan was: We will defer the colonoscopy at the present time. Readdress next year. At the present time he is busy caring for his wife is dying from cancer. Blood pressure is excellent. Glycemic control is suboptimal but he would like to try to address this with diet exercise and weight loss. Cholesterol is excellent. I will add a PSA to his lab work. The remainder of his exam is normal. He declines hepatitis C screening  01/01/16  Patient is not checking his blood work. However he denies any hypoglycemic episodes. He denies any chest pain or shortness of breath. His blood pressure is still very high today. He is here mainly for lab work. He denies any myalgias or right upper quadrant pain. He is still caring for his wife who has terminal brain cancer and he is due for his flu shot Immunization History  Administered Date(s) Administered  . Influenza,inj,Quad PF,36+ Mos 12/03/2012, 12/23/2013  . Pneumococcal Conjugate-13 12/03/2012  . Pneumococcal Polysaccharide-23 12/23/2013  . Td 02/14/1997, 04/18/2011  . Tdap 04/18/2011    Past Medical History:  Diagnosis Date  . Adenomatous colon polyp 2003  . Diabetes mellitus   . Hyperlipidemia   .  Hypertension    Past Surgical History:  Procedure Laterality Date  . ABDOMINAL AORTIC ANEURYSM REPAIR  2007  . CARDIOVERSION N/A 03/29/2014   Procedure: CARDIOVERSION;  Surgeon: Josue Hector, MD;  Location: [facility] ENDOSCOPY;  Service: Cardiovascular;  Laterality: N/A;  . [place]   right   Current Outpatient Prescriptions on File Prior to Visit  Medication Sig Dispense Refill  . amLODipine (NORVASC) 5 MG tablet Take 1 tablet (5 mg total) by mouth daily. 90 tablet 3  . Cholecalciferol (VITAMIN D3) 1000 UNITS CAPS Take 2,000 Units by mouth daily.     . cloNIDine (CATAPRES) 0.2 MG tablet Take 1 tablet (0.2 mg total) by mouth 2 (two) times daily. 180 tablet 3  . dabigatran (PRADAXA) 150 MG CAPS capsule Take 1 capsule (150 mg total) by mouth 2 (two) times daily. 180 capsule 3  . fish oil-omega-3 fatty acids 1000 MG capsule Take 1,200 mg by mouth daily.    Marland Kitchen lisinopril (PRINIVIL,ZESTRIL) 40 MG tablet Take 1 tablet (40 mg total) by mouth daily. 90 tablet 3  . metFORMIN (GLUCOPHAGE) 1000 MG tablet Take 1 tablet (1,000 mg total) by mouth 2 (two) times daily with a meal. 180 tablet 3  . metoprolol (LOPRESSOR) 50 MG tablet Take 1 tablet (50 mg total) by mouth 2 (two) times daily. 180 tablet 3  . pravastatin (PRAVACHOL) 40 MG tablet Take 1 tablet (40 mg total) by  mouth daily. 90 tablet 3   No current facility-administered medications on file prior to visit.    No Known Allergies Social History   Social History  . Marital status: Married    Spouse name: N/A  . Number of children: N/A  . Years of education: N/A   Occupational History  . Not on file.   Social History Main Topics  . Smoking status: Former Smoker    Years: 15.00    Types: Cigarettes    Quit date: 03/03/2005  . Smokeless tobacco: Never Used  . Alcohol use 2.4 oz/week    4 Cans of beer per week  . Drug use: No  . Sexual activity: Not on file   Other Topics Concern  . Not on file   Social History  Narrative  . No narrative on file   Family History  Problem Relation Age of Onset  . Heart disease Father   . Diabetes Paternal Aunt   . Heart disease Paternal Uncle   . Heart disease Paternal Grandfather   . Heart failure Mother     died in sleep at 21 years    No visits with results within 1 Month(s) from this visit.  Latest known visit with results is:  Orders Only on 06/26/2015  Component Date Value Ref Range Status  . PSA 06/30/2015 3.57  <=4.00 ng/mL Final   Comment: Test Methodology: ECLIA PSA (Electrochemiluminescence Immunoassay)   For PSA values from 2.5-4.0, particularly in younger men <49 years old, the AUA and NCCN suggest testing for % Free PSA (3515) and evaluation of the rate of increase in PSA (PSA velocity).       Review of Systems  All other systems reviewed and are negative.      Objective:   Physical Exam  Constitutional: He is oriented to person, place, and time. He appears well-developed and well-nourished. No distress.  HENT:  Head: Normocephalic and atraumatic.  Right Ear: External ear normal.  Left Ear: External ear normal.  Nose: Nose normal.  Mouth/Throat: Oropharynx is clear and moist. No oropharyngeal exudate.  Eyes: Conjunctivae and EOM are normal. Pupils are equal, round, and reactive to light. Right eye exhibits no discharge. Left eye exhibits no discharge. No scleral icterus.  Neck: Normal range of motion. Neck supple. No JVD present. No tracheal deviation present. No thyromegaly present.  Cardiovascular: Normal rate, normal heart sounds and intact distal pulses.  An irregularly irregular rhythm present. Exam reveals no gallop and no friction rub.   No murmur heard. Pulmonary/Chest: Effort normal and breath sounds normal. No stridor. No respiratory distress. He has no wheezes. He has no rales. He exhibits no tenderness.  Abdominal: Soft. Bowel sounds are normal. He exhibits no distension and no mass. There is no tenderness. There is no  rebound and no guarding.  Musculoskeletal: Normal range of motion. He exhibits no edema or tenderness.  Lymphadenopathy:    He has no cervical adenopathy.  Neurological: He is alert and oriented to person, place, and time. He has normal reflexes. No cranial nerve deficit. He exhibits normal muscle tone. Coordination normal.  Skin: Skin is warm. No rash noted. He is not diaphoretic. No erythema. No pallor.  Psychiatric: He has a normal mood and affect. His behavior is normal. Judgment and thought content normal.  Vitals reviewed.         Assessment & Plan:  Controlled type 2 diabetes mellitus without complication, without long-term current use of insulin ([place]) - Plan: CBC with Differential/Platelet,  COMPLETE METABOLIC PANEL WITH GFR, Lipid panel, Microalbumin, urine, Hemoglobin A1c  Abnormal heart rate - Plan: amLODipine (NORVASC) 10 MG tablet  Routine general medical examination at a health care facility - Plan: amLODipine (NORVASC) 10 MG tablet  Need for prophylactic vaccination against Streptococcus pneumoniae (pneumococcus) - Plan: amLODipine (NORVASC) 10 MG tablet  Need for prophylactic vaccination and inoculation against influenza - Plan: amLODipine (NORVASC) 10 MG tablet  Patient's blood pressure is elevated today. Increase amlodipine to 10 mg a day and recheck blood pressure in one month. Patient is still in atrial fibrillation. I gave him samples of Xarelto as he can no longer afford pradaxa.  Check cholesterol. Goal LDL cholesterol is less than 100. Patient received his flu shot today. Heart rate is well controlled. Check hemoglobin A1c. Goal hemoglobin A1c is less than 7. We did discuss dietary changes to help achieve this

## 2016-01-01 NOTE — Addendum Note (Signed)
Addended by: Shary Decamp B on: 01/01/2016 11:22 AM   Modules accepted: Orders

## 2016-01-02 LAB — HEMOGLOBIN A1C
Hgb A1c MFr Bld: 6.8 % — ABNORMAL HIGH (ref <5.7)
Mean Plasma Glucose: 148 mg/dL

## 2016-01-02 LAB — MICROALBUMIN, URINE: MICROALB UR: 4.7 mg/dL

## 2016-01-17 ENCOUNTER — Telehealth: Payer: Self-pay | Admitting: Family Medicine

## 2016-01-17 MED ORDER — RIVAROXABAN 20 MG PO TABS: 20.0000 mg | ORAL_TABLET | Freq: Every day | ORAL | 3 refills | Status: DC

## 2016-01-17 NOTE — Telephone Encounter (Signed)
Patient is calling to get 90 day supply of [facility] if possible

## 2016-01-17 NOTE — Telephone Encounter (Signed)
Medication called/sent to requested pharmacy  

## 2016-04-10 ENCOUNTER — Other Ambulatory Visit: Payer: Self-pay | Admitting: Family Medicine

## 2016-04-10 DIAGNOSIS — Z0000 Encounter for general adult medical examination without abnormal findings: Secondary | ICD-10-CM

## 2016-04-10 DIAGNOSIS — Z23 Encounter for immunization: Secondary | ICD-10-CM

## 2016-04-10 DIAGNOSIS — R009 Unspecified abnormalities of heart beat: Secondary | ICD-10-CM

## 2016-04-10 MED ORDER — LISINOPRIL 40 MG PO TABS: 40.0000 mg | ORAL_TABLET | Freq: Every day | ORAL | 3 refills | Status: DC

## 2016-04-10 MED ORDER — PRAVASTATIN SODIUM 40 MG PO TABS: 40.0000 mg | ORAL_TABLET | Freq: Every day | ORAL | 3 refills | Status: DC

## 2016-06-25 ENCOUNTER — Other Ambulatory Visit: Payer: Self-pay | Admitting: Family Medicine

## 2016-06-25 DIAGNOSIS — I480 Paroxysmal atrial fibrillation: Secondary | ICD-10-CM

## 2016-06-25 MED ORDER — METOPROLOL TARTRATE 50 MG PO TABS: 50.0000 mg | ORAL_TABLET | Freq: Two times a day (BID) | ORAL | 3 refills | Status: DC

## 2016-06-27 ENCOUNTER — Other Ambulatory Visit: Payer: Self-pay | Admitting: *Deleted

## 2016-06-27 DIAGNOSIS — I480 Paroxysmal atrial fibrillation: Secondary | ICD-10-CM

## 2016-06-27 MED ORDER — METOPROLOL TARTRATE 50 MG PO TABS: 50.0000 mg | ORAL_TABLET | Freq: Two times a day (BID) | ORAL | 3 refills | Status: DC

## 2016-07-05 ENCOUNTER — Other Ambulatory Visit: Payer: Self-pay | Admitting: Family Medicine

## 2016-07-05 DIAGNOSIS — Z23 Encounter for immunization: Secondary | ICD-10-CM

## 2016-07-05 DIAGNOSIS — R009 Unspecified abnormalities of heart beat: Secondary | ICD-10-CM

## 2016-07-05 DIAGNOSIS — Z0000 Encounter for general adult medical examination without abnormal findings: Secondary | ICD-10-CM

## 2016-07-05 MED ORDER — CLONIDINE HCL 0.2 MG PO TABS: 0.2000 mg | ORAL_TABLET | Freq: Two times a day (BID) | ORAL | 3 refills | Status: DC

## 2016-07-08 ENCOUNTER — Other Ambulatory Visit: Payer: Self-pay | Admitting: Family Medicine

## 2016-07-08 DIAGNOSIS — Z23 Encounter for immunization: Secondary | ICD-10-CM

## 2016-07-08 DIAGNOSIS — R009 Unspecified abnormalities of heart beat: Secondary | ICD-10-CM

## 2016-07-08 DIAGNOSIS — Z0000 Encounter for general adult medical examination without abnormal findings: Secondary | ICD-10-CM

## 2016-07-08 MED ORDER — CLONIDINE HCL 0.2 MG PO TABS: 0.2000 mg | ORAL_TABLET | Freq: Two times a day (BID) | ORAL | 3 refills | Status: DC

## 2016-11-19 ENCOUNTER — Telehealth: Payer: Self-pay | Admitting: Family Medicine

## 2016-11-19 NOTE — Telephone Encounter (Signed)
CB# [phone]  Patient called in requesting a refill on his metformin he uses [facility]. He isn't completely out but will need within a week or so.

## 2016-11-20 ENCOUNTER — Other Ambulatory Visit: Payer: Self-pay | Admitting: Family Medicine

## 2016-11-20 DIAGNOSIS — Z0000 Encounter for general adult medical examination without abnormal findings: Secondary | ICD-10-CM

## 2016-11-20 DIAGNOSIS — Z23 Encounter for immunization: Secondary | ICD-10-CM

## 2016-11-20 DIAGNOSIS — R009 Unspecified abnormalities of heart beat: Secondary | ICD-10-CM

## 2016-11-20 NOTE — Telephone Encounter (Signed)
Medication called/sent to requested pharmacy  

## 2016-12-27 ENCOUNTER — Other Ambulatory Visit: Payer: PPO

## 2016-12-27 DIAGNOSIS — E1165 Type 2 diabetes mellitus with hyperglycemia: Secondary | ICD-10-CM

## 2016-12-27 DIAGNOSIS — I10 Essential (primary) hypertension: Secondary | ICD-10-CM | POA: Diagnosis not present

## 2016-12-27 DIAGNOSIS — E785 Hyperlipidemia, unspecified: Secondary | ICD-10-CM

## 2016-12-27 DIAGNOSIS — Z79899 Other long term (current) drug therapy: Secondary | ICD-10-CM

## 2016-12-27 DIAGNOSIS — IMO0002 Reserved for concepts with insufficient information to code with codable children: Secondary | ICD-10-CM

## 2016-12-28 LAB — CBC WITH DIFFERENTIAL/PLATELET
BASOS ABS: 71 {cells}/uL (ref 0–200)
Basophils Relative: 1 %
EOS PCT: 3.4 %
Eosinophils Absolute: 241 cells/uL (ref 15–500)
HCT: 38.9 % (ref 38.5–50.0)
Hemoglobin: 12.6 g/dL — ABNORMAL LOW (ref 13.2–17.1)
LYMPHS ABS: 1796 {cells}/uL (ref 850–3900)
MCH: 29.4 pg (ref 27.0–33.0)
MCHC: 32.4 g/dL (ref 32.0–36.0)
MCV: 90.9 fL (ref 80.0–100.0)
MONOS PCT: 10 %
MPV: 11.2 fL (ref 7.5–12.5)
NEUTROS PCT: 60.3 %
Neutro Abs: 4281 cells/uL (ref 1500–7800)
PLATELETS: 241 10*3/uL (ref 140–400)
RBC: 4.28 10*6/uL (ref 4.20–5.80)
RDW: 12.9 % (ref 11.0–15.0)
TOTAL LYMPHOCYTE: 25.3 %
WBC mixed population: 710 cells/uL (ref 200–950)
WBC: 7.1 10*3/uL (ref 3.8–10.8)

## 2016-12-28 LAB — COMPREHENSIVE METABOLIC PANEL
AG Ratio: 1.4 (calc) (ref 1.0–2.5)
ALBUMIN MSPROF: 3.6 g/dL (ref 3.6–5.1)
ALT: 8 U/L — AB (ref 9–46)
AST: 10 U/L (ref 10–35)
Alkaline phosphatase (APISO): 62 U/L (ref 40–115)
BILIRUBIN TOTAL: 0.3 mg/dL (ref 0.2–1.2)
BUN: 20 mg/dL (ref 7–25)
CALCIUM: 8.6 mg/dL (ref 8.6–10.3)
CO2: 31 mmol/L (ref 20–32)
CREATININE: 1 mg/dL (ref 0.70–1.25)
Chloride: 99 mmol/L (ref 98–110)
GLUCOSE: 164 mg/dL — AB (ref 65–99)
Globulin: 2.6 g/dL (calc) (ref 1.9–3.7)
POTASSIUM: 4.5 mmol/L (ref 3.5–5.3)
SODIUM: 137 mmol/L (ref 135–146)
TOTAL PROTEIN: 6.2 g/dL (ref 6.1–8.1)

## 2016-12-28 LAB — LIPID PANEL
CHOL/HDL RATIO: 2.8 (calc) (ref <5.0)
Cholesterol: 101 mg/dL (ref <200)
HDL: 36 mg/dL — ABNORMAL LOW (ref >40)
LDL Cholesterol (Calc): 44 mg/dL (calc)
Non-HDL Cholesterol (Calc): 65 mg/dL (calc) (ref <130)
TRIGLYCERIDES: 127 mg/dL (ref <150)

## 2016-12-28 LAB — HEMOGLOBIN A1C
HEMOGLOBIN A1C: 7.2 %{Hb} — AB (ref <5.7)
Mean Plasma Glucose: 160 (calc)
eAG (mmol/L): 8.9 (calc)

## 2016-12-31 ENCOUNTER — Ambulatory Visit (INDEPENDENT_AMBULATORY_CARE_PROVIDER_SITE_OTHER): Payer: PPO | Admitting: Family Medicine

## 2016-12-31 ENCOUNTER — Encounter: Payer: Self-pay | Admitting: Family Medicine

## 2016-12-31 VITALS — BP 160/78 | HR 58 | Temp 98.0°F | Resp 18 | Ht 68.0 in | Wt 194.0 lb

## 2016-12-31 DIAGNOSIS — I482 Chronic atrial fibrillation: Secondary | ICD-10-CM | POA: Diagnosis not present

## 2016-12-31 DIAGNOSIS — R0609 Other forms of dyspnea: Secondary | ICD-10-CM

## 2016-12-31 DIAGNOSIS — I10 Essential (primary) hypertension: Secondary | ICD-10-CM

## 2016-12-31 DIAGNOSIS — I4821 Permanent atrial fibrillation: Secondary | ICD-10-CM

## 2016-12-31 DIAGNOSIS — Z23 Encounter for immunization: Secondary | ICD-10-CM

## 2016-12-31 DIAGNOSIS — E119 Type 2 diabetes mellitus without complications: Secondary | ICD-10-CM | POA: Diagnosis not present

## 2016-12-31 MED ORDER — POTASSIUM CHLORIDE CRYS ER 20 MEQ PO TBCR: 20.0000 meq | EXTENDED_RELEASE_TABLET | Freq: Every day | ORAL | 3 refills | Status: DC

## 2016-12-31 MED ORDER — FUROSEMIDE 40 MG PO TABS: 40.0000 mg | ORAL_TABLET | Freq: Every day | ORAL | 3 refills | Status: DC

## 2016-12-31 NOTE — Progress Notes (Signed)
Subjective:    Patient ID: Charles Bailey, male    DOB: 1945/07/21, 71 y.o.   MRN: [id number]  HPI  06/29/15 Here today for complete physical exam.   Last colonoscopy 02/2011 was significant for 5 polyps. Recommendations were made to repeat colonoscopy in 3 years and therefore he is overdue.  He is also due for the shingles vaccine as well as hepatitis C screening. Diabetic eye exam is up-to-date.  His wife has been battling brain cancer. He admits that due to the stress of this they have been more liberal in her diet and been eating ice cream almost on a daily basis. Because of this reason he declines a colonoscopy as he is just too busy at the present time caring for her. He would like to try diet and exercise to address his sugars.  AT that time, my plan was: We will defer the colonoscopy at the present time. Readdress next year. At the present time he is busy caring for his wife is dying from cancer. Blood pressure is excellent. Glycemic control is suboptimal but he would like to try to address this with diet exercise and weight loss. Cholesterol is excellent. I will add a PSA to his lab work. The remainder of his exam is normal. He declines hepatitis C screening  01/01/16  Patient is not checking his blood work. However he denies any hypoglycemic episodes. He denies any chest pain or shortness of breath. His blood pressure is still very high today. He is here mainly for lab work. He denies any myalgias or right upper quadrant pain. He is still caring for his wife who has terminal brain cancer and he is due for his flu shot.  At that time, my plan was: Patient's blood pressure is elevated today. Increase amlodipine to 10 mg a day and recheck blood pressure in one month. Patient is still in atrial fibrillation. I gave him samples of Xarelto as he can no longer afford pradaxa.  Check cholesterol. Goal LDL cholesterol is less than 100. Patient received his flu shot today. Heart rate is well controlled.  Check hemoglobin A1c. Goal hemoglobin A1c is less than 7. We did discuss dietary changes to help achieve this  12/31/16 Patient has not been seen in a year. He is here today scheduled for complete physical exam however he has concerning history. He reports gradually worsening dyspnea on exertion. He denies any orthopnea. He denies any paroxysmal nocturnal dyspnea however he has witnessed increased swelling in both legs. He has +1 pitting edema in both legs to the level of his knee. On exam, he also has bibasilar crackles posteriorly in both lungs. There is no JVD on exam. He denies any chest pain. He denies any hemoptysis. He denies any cough. He does have a history of 30 pack years of smoking but he quit almost 10 years ago. EKG was obtained today in the office which revealed atrial fibrillation with a variable heart rate between 50 and 60 bpm there were nonspecific ST changes in the inferolateral leads that are chronic. Compared to his EKG from 2016 there were no significant changes. His blood pressure today is elevated at 160/78. His most recent lab work is listed below. Immunization History  Administered Date(s) Administered  . Influenza, High Dose Seasonal PF 12/31/2016  . Influenza,inj,Quad PF,6+ Mos 12/03/2012, 12/23/2013, 01/01/2016  . Pneumococcal Conjugate-13 12/03/2012  . Pneumococcal Polysaccharide-23 12/23/2013  . Td 02/14/1997, 04/18/2011  . Tdap 04/18/2011    Past Medical History:  Diagnosis Date  . Adenomatous colon polyp 2003  . Diabetes mellitus   . Hyperlipidemia   . Hypertension    Past Surgical History:  Procedure Laterality Date  . ABDOMINAL AORTIC ANEURYSM REPAIR  2007  . CARDIOVERSION N/A 03/29/2014   Procedure: CARDIOVERSION;  Surgeon: Josue Hector, MD;  Location: [facility] ENDOSCOPY;  Service: Cardiovascular;  Laterality: N/A;  . [place]   right   Current Outpatient Medications on File Prior to Visit  Medication Sig Dispense Refill  . amLODipine  (NORVASC) 10 MG tablet Take 1 tablet (10 mg total) by mouth daily. 90 tablet 3  . Cholecalciferol (VITAMIN D3) 1000 UNITS CAPS Take 2,000 Units by mouth daily.     . cloNIDine (CATAPRES) 0.2 MG tablet Take 1 tablet (0.2 mg total) by mouth 2 (two) times daily. 180 tablet 3  . fish oil-omega-3 fatty acids 1000 MG capsule Take 1,200 mg by mouth daily.    Marland Kitchen lisinopril (PRINIVIL,ZESTRIL) 40 MG tablet Take 1 tablet (40 mg total) by mouth daily. 90 tablet 3  . metFORMIN (GLUCOPHAGE) 1000 MG tablet Take 1 tablet by mouth twice a day with meals 180 tablet 2  . metoprolol tartrate (LOPRESSOR) 50 MG tablet Take 1 tablet (50 mg total) by mouth 2 (two) times daily. 180 tablet 3  . pravastatin (PRAVACHOL) 40 MG tablet Take 1 tablet (40 mg total) by mouth daily. 90 tablet 3  . rivaroxaban (XARELTO) 20 MG TABS tablet Take 1 tablet (20 mg total) by mouth daily with supper. 90 tablet 3   No current facility-administered medications on file prior to visit.    No Known Allergies Social History   Socioeconomic History  . Marital status: Married    Spouse name: Not on file  . Number of children: Not on file  . Years of education: Not on file  . Highest education level: Not on file  Social Needs  . Financial resource strain: Not on file  . Food insecurity - worry: Not on file  . Food insecurity - inability: Not on file  . Transportation needs - medical: Not on file  . Transportation needs - non-medical: Not on file  Occupational History  . Not on file  Tobacco Use  . Smoking status: Former Smoker    Years: 15.00    Types: Cigarettes    Last attempt to quit: 03/03/2005    Years since quitting: 11.8  . Smokeless tobacco: Never Used  Substance and Sexual Activity  . Alcohol use: Yes    Alcohol/week: 2.4 oz    Types: 4 Cans of beer per week  . Drug use: No  . Sexual activity: Not on file  Other Topics Concern  . Not on file  Social History Narrative  . Not on file   Family History  Problem  Relation Age of Onset  . Heart disease Father   . Heart failure Mother        died in sleep at 61 years   . Diabetes Paternal Aunt   . Heart disease Paternal Uncle   . Heart disease Paternal Grandfather    Appointment on 12/27/2016  Component Date Value Ref Range Status  . WBC 12/27/2016 7.1  3.8 - 10.8 Thousand/uL Final  . RBC 12/27/2016 4.28  4.20 - 5.80 Million/uL Final  . Hemoglobin 12/27/2016 12.6* 13.2 - 17.1 g/dL Final  . HCT 12/27/2016 38.9  38.5 - 50.0 % Final  . MCV 12/27/2016 90.9  80.0 - 100.0 fL Final  .  MCH 12/27/2016 29.4  27.0 - 33.0 pg Final  . MCHC 12/27/2016 32.4  32.0 - 36.0 g/dL Final  . RDW 12/27/2016 12.9  11.0 - 15.0 % Final  . Platelets 12/27/2016 241  140 - 400 Thousand/uL Final  . MPV 12/27/2016 11.2  7.5 - 12.5 fL Final  . Neutro Abs 12/27/2016 4,281  1,500 - 7,800 cells/uL Final  . Lymphs Abs 12/27/2016 1,796  850 - 3,900 cells/uL Final  . WBC mixed population 12/27/2016 710  200 - 950 cells/uL Final  . Eosinophils Absolute 12/27/2016 241  15 - 500 cells/uL Final  . Basophils Absolute 12/27/2016 71  0 - 200 cells/uL Final  . Neutrophils Relative % 12/27/2016 60.3  % Final  . Total Lymphocyte 12/27/2016 25.3  % Final  . Monocytes Relative 12/27/2016 10.0  % Final  . Eosinophils Relative 12/27/2016 3.4  % Final  . Basophils Relative 12/27/2016 1.0  % Final  . Glucose, Bld 12/27/2016 164* 65 - 99 mg/dL Final   Comment: .            Fasting reference interval . For someone without known diabetes, a glucose value >125 mg/dL indicates that they may have diabetes and this should be confirmed with a follow-up test. .   . BUN 12/27/2016 20  7 - 25 mg/dL Final  . Creat 12/27/2016 1.00  0.70 - 1.25 mg/dL Final   Comment: For patients >28 years of age, the reference limit for Creatinine is approximately 13% higher for people identified as African-American. .   Havery Moros Ratio 37/85/8850 NOT APPLICABLE  6 - 22 (calc) Final  . Sodium 12/27/2016  137  135 - 146 mmol/L Final  . Potassium 12/27/2016 4.5  3.5 - 5.3 mmol/L Final  . Chloride 12/27/2016 99  98 - 110 mmol/L Final  . CO2 12/27/2016 31  20 - 32 mmol/L Final  . Calcium 12/27/2016 8.6  8.6 - 10.3 mg/dL Final  . Total Protein 12/27/2016 6.2  6.1 - 8.1 g/dL Final  . Albumin 12/27/2016 3.6  3.6 - 5.1 g/dL Final  . Globulin 12/27/2016 2.6  1.9 - 3.7 g/dL (calc) Final  . AG Ratio 12/27/2016 1.4  1.0 - 2.5 (calc) Final  . Total Bilirubin 12/27/2016 0.3  0.2 - 1.2 mg/dL Final  . Alkaline phosphatase (APISO) 12/27/2016 62  40 - 115 U/L Final  . AST 12/27/2016 10  10 - 35 U/L Final  . ALT 12/27/2016 8* 9 - 46 U/L Final  . Cholesterol 12/27/2016 101  <200 mg/dL Final  . HDL 12/27/2016 36* >40 mg/dL Final  . Triglycerides 12/27/2016 127  <150 mg/dL Final  . LDL Cholesterol (Calc) 12/27/2016 44  mg/dL (calc) Final   Comment: Reference range: <100 . Desirable range <100 mg/dL for primary prevention;   <70 mg/dL for patients with CHD or diabetic patients  with > or = 2 CHD risk factors. Marland Kitchen LDL-C is now calculated using the Martin-Hopkins  calculation, which is a validated novel method providing  better accuracy than the Friedewald equation in the  estimation of LDL-C.  Cresenciano Genre et al. Annamaria Helling. 2774;128(78): 2061-2068  (http://education.[facility].com/faq/FAQ164)   . Total CHOL/HDL Ratio 12/27/2016 2.8  <5.0 (calc) Final  . Non-HDL Cholesterol (Calc) 12/27/2016 65  <130 mg/dL (calc) Final   Comment: For patients with diabetes plus 1 major ASCVD risk  factor, treating to a non-HDL-C goal of <100 mg/dL  (LDL-C of <70 mg/dL) is considered a therapeutic  option.   . Hgb A1c MFr  Bld 12/27/2016 7.2* <5.7 % of total Hgb Final   Comment: For someone without known diabetes, a hemoglobin A1c value of 6.5% or greater indicates that they may have  diabetes and this should be confirmed with a follow-up  test. . For someone with known diabetes, a value <7% indicates  that their  diabetes is well controlled and a value  greater than or equal to 7% indicates suboptimal  control. A1c targets should be individualized based on  duration of diabetes, age, comorbid conditions, and  other considerations. . Currently, no consensus exists regarding use of hemoglobin A1c for diagnosis of diabetes for children. .   . Mean Plasma Glucose 12/27/2016 160  (calc) Final  . eAG (mmol/L) 12/27/2016 8.9  (calc) Final      Review of Systems  All other systems reviewed and are negative.      Objective:   Physical Exam  Constitutional: He is oriented to person, place, and time. He appears well-developed and well-nourished. No distress.  HENT:  Head: Normocephalic and atraumatic.  Right Ear: External ear normal.  Left Ear: External ear normal.  Nose: Nose normal.  Mouth/Throat: Oropharynx is clear and moist. No oropharyngeal exudate.  Eyes: Conjunctivae and EOM are normal. Pupils are equal, round, and reactive to light. Right eye exhibits no discharge. Left eye exhibits no discharge. No scleral icterus.  Neck: Normal range of motion. Neck supple. No JVD present. No tracheal deviation present. No thyromegaly present.  Cardiovascular: Normal rate, normal heart sounds and intact distal pulses. An irregularly irregular rhythm present. Exam reveals no gallop and no friction rub.  No murmur heard. Pulmonary/Chest: Effort normal and breath sounds normal. No stridor. No respiratory distress. He has no wheezes. He has no rales. He exhibits no tenderness.  Abdominal: Soft. Bowel sounds are normal. He exhibits no distension and no mass. There is no tenderness. There is no rebound and no guarding.  Musculoskeletal: Normal range of motion. He exhibits no edema or tenderness.  Lymphadenopathy:    He has no cervical adenopathy.  Neurological: He is alert and oriented to person, place, and time. He has normal reflexes. No cranial nerve deficit. He exhibits normal muscle tone. Coordination  normal.  Skin: Skin is warm. No rash noted. He is not diaphoretic. No erythema. No pallor.  Psychiatric: He has a normal mood and affect. His behavior is normal. Judgment and thought content normal.  Vitals reviewed.         Assessment & Plan:  Dyspnea on exertion - Plan: DG Chest 2 View, Brain natriuretic peptide, EKG 12-Lead  Needs flu shot - Plan: Flu vaccine HIGH DOSE PF (Fluzone High dose)  Controlled type 2 diabetes mellitus without complication, without long-term current use of insulin (HCC)  Essential hypertension  Permanent atrial fibrillation (HCC) Despite being here for complete physical exam, I focused his visit on dyspnea on exertion. He appears fluid overloaded today. He has +1 pitting edema in both legs and crackles in both lungs. I will check a BNP to evaluate for signs of congestive heart failure. I will also schedule a chest x-ray to rule out bibasilar pneumonia and to determine if the patient has pulmonary edema. I will start him on Lasix 40 mg a day in addition to potassium 20 mEq a day and schedule the patient for an echocardiogram. I will see the patient back in one week for follow-up to recheck his blood pressure after diuresis and see if his dyspnea on exertion is improving. If chest x-ray  is negative, if echo is negative for heart failure, if BNP is normal, I will screen the patient with pulmonary function test for underlying possible COPD. Diabetes is not adequately controlled however I want to focus on the cause of his dyspnea on exertion first. He may be a good candidate for Jardiance in the future.  Patient received his flu shot today

## 2017-01-01 LAB — BRAIN NATRIURETIC PEPTIDE: BRAIN NATRIURETIC PEPTIDE: 377 pg/mL — AB (ref <100)

## 2017-01-02 ENCOUNTER — Ambulatory Visit
Admission: RE | Admit: 2017-01-02 | Discharge: 2017-01-02 | Disposition: A | Discharge status: Home / Self Care | Payer: PPO | Source: Ambulatory Visit | Attending: Family Medicine | Admitting: Family Medicine

## 2017-01-02 ENCOUNTER — Other Ambulatory Visit: Payer: Self-pay | Admitting: Family Medicine

## 2017-01-02 DIAGNOSIS — R05 Cough: Secondary | ICD-10-CM | POA: Diagnosis not present

## 2017-01-02 DIAGNOSIS — R0609 Other forms of dyspnea: Principal | ICD-10-CM

## 2017-01-08 ENCOUNTER — Ambulatory Visit (HOSPITAL_COMMUNITY): Payer: PPO | Attending: Cardiology

## 2017-01-09 ENCOUNTER — Encounter: Payer: Self-pay | Admitting: Family Medicine

## 2017-01-09 ENCOUNTER — Ambulatory Visit: Payer: PPO | Admitting: Family Medicine

## 2017-01-09 VITALS — BP 120/62 | HR 62 | Temp 97.7°F | Resp 18 | Ht 68.0 in | Wt 193.0 lb

## 2017-01-09 DIAGNOSIS — R0609 Other forms of dyspnea: Secondary | ICD-10-CM

## 2017-01-09 NOTE — Progress Notes (Signed)
Subjective:    Patient ID: Charles Bailey, male    DOB: [birth year], 71 y.o.   MRN: [id number]  HPI  06/29/15 Here today for complete physical exam.   Last colonoscopy 02/2011 was significant for 5 polyps. Recommendations were made to repeat colonoscopy in 3 years and therefore he is overdue.  He is also due for the shingles vaccine as well as hepatitis C screening. Diabetic eye exam is up-to-date.  His wife has been battling brain cancer. He admits that due to the stress of this they have been more liberal in her diet and been eating ice cream almost on a daily basis. Because of this reason he declines a colonoscopy as he is just too busy at the present time caring for her. He would like to try diet and exercise to address his sugars.  AT that time, my plan was: We will defer the colonoscopy at the present time. Readdress next year. At the present time he is busy caring for his wife is dying from cancer. Blood pressure is excellent. Glycemic control is suboptimal but he would like to try to address this with diet exercise and weight loss. Cholesterol is excellent. I will add a PSA to his lab work. The remainder of his exam is normal. He declines hepatitis C screening  01/01/16  Patient is not checking his blood work. However he denies any hypoglycemic episodes. He denies any chest pain or shortness of breath. His blood pressure is still very high today. He is here mainly for lab work. He denies any myalgias or right upper quadrant pain. He is still caring for his wife who has terminal brain cancer and he is due for his flu shot.  At that time, my plan was: Patient's blood pressure is elevated today. Increase amlodipine to 10 mg a day and recheck blood pressure in one month. Patient is still in atrial fibrillation. I gave him samples of Xarelto as he can no longer afford pradaxa.  Check cholesterol. Goal LDL cholesterol is less than 100. Patient received his flu shot today. Heart rate is well controlled.  Check hemoglobin A1c. Goal hemoglobin A1c is less than 7. We did discuss dietary changes to help achieve this  12/31/16 Patient has not been seen in a year. He is here today scheduled for complete physical exam however he has concerning history. He reports gradually worsening dyspnea on exertion. He denies any orthopnea. He denies any paroxysmal nocturnal dyspnea however he has witnessed increased swelling in both legs. He has +1 pitting edema in both legs to the level of his knee. On exam, he also has bibasilar crackles posteriorly in both lungs. There is no JVD on exam. He denies any chest pain. He denies any hemoptysis. He denies any cough. He does have a history of 30 pack years of smoking but he quit almost 10 years ago. EKG was obtained today in the office which revealed atrial fibrillation with a variable heart rate between 50 and 60 bpm there were nonspecific ST changes in the inferolateral leads that are chronic. Compared to his EKG from 2016 there were no significant changes. His blood pressure today is elevated at 160/78. His most recent lab work is listed below.  AT that time, my plan was: Despite being here for complete physical exam, I focused his visit on dyspnea on exertion. He appears fluid overloaded today. He has +1 pitting edema in both legs and crackles in both lungs. I will check a BNP to evaluate  for signs of congestive heart failure. I will also schedule a chest x-ray to rule out bibasilar pneumonia and to determine if the patient has pulmonary edema. I will start him on Lasix 40 mg a day in addition to potassium 20 mEq a day and schedule the patient for an echocardiogram. I will see the patient back in one week for follow-up to recheck his blood pressure after diuresis and see if his dyspnea on exertion is improving. If chest x-ray is negative, if echo is negative for heart failure, if BNP is normal, I will screen the patient with pulmonary function test for underlying possible COPD.  Diabetes is not adequately controlled however I want to focus on the cause of his dyspnea on exertion first. He may be a good candidate for Jardiance in the future.  Patient received his flu shot today  01/09/17 Patient states that the shortness of breath has improved since starting Lasix. His weight is essentially the same however the edema in his legs has improved. Furthermore, the bibasilar crackles have also improved. Chest x-ray showed no acute cardiopulmonary process. However his lab work was significant for an elevated BNP of 377. I have recommended and scheduled an echocardiogram of heart however the patient has missed his scheduled appointment yesterday Immunization History  Administered Date(s) Administered  . Influenza, High Dose Seasonal PF 12/31/2016  . Influenza,inj,Quad PF,6+ Mos 12/03/2012, 12/23/2013, 01/01/2016  . Pneumococcal Conjugate-13 12/03/2012  . Pneumococcal Polysaccharide-23 12/23/2013  . Td 02/14/1997, 04/18/2011  . Tdap 04/18/2011    Past Medical History:  Diagnosis Date  . Adenomatous colon polyp 2003  . Diabetes mellitus   . Hyperlipidemia   . Hypertension    Past Surgical History:  Procedure Laterality Date  . ABDOMINAL AORTIC ANEURYSM REPAIR  2007  . CARDIOVERSION N/A 03/29/2014   Procedure: CARDIOVERSION;  Surgeon: Josue Hector, MD;  Location: [facility];  Service: Cardiovascular;  Laterality: N/A;  . [place]   right   Current Outpatient Medications on File Prior to Visit  Medication Sig Dispense Refill  . amLODipine (NORVASC) 10 MG tablet Take 1 tablet (10 mg total) by mouth daily. 90 tablet 3  . Cholecalciferol (VITAMIN D3) 1000 UNITS CAPS Take 2,000 Units by mouth daily.     . cloNIDine (CATAPRES) 0.2 MG tablet Take 1 tablet (0.2 mg total) by mouth 2 (two) times daily. 180 tablet 3  . fish oil-omega-3 fatty acids 1000 MG capsule Take 1,200 mg by mouth daily.    . furosemide (LASIX) 40 MG tablet Take 1 tablet (40 mg total)  by mouth daily. 30 tablet 3  . lisinopril (PRINIVIL,ZESTRIL) 40 MG tablet Take 1 tablet (40 mg total) by mouth daily. 90 tablet 3  . metFORMIN (GLUCOPHAGE) 1000 MG tablet Take 1 tablet by mouth twice a day with meals 180 tablet 2  . metoprolol tartrate (LOPRESSOR) 50 MG tablet Take 1 tablet (50 mg total) by mouth 2 (two) times daily. 180 tablet 3  . potassium chloride SA (K-DUR,KLOR-CON) 20 MEQ tablet Take 1 tablet (20 mEq total) by mouth daily. 30 tablet 3  . pravastatin (PRAVACHOL) 40 MG tablet Take 1 tablet (40 mg total) by mouth daily. 90 tablet 3  . rivaroxaban (XARELTO) 20 MG TABS tablet Take 1 tablet (20 mg total) by mouth daily with supper. 90 tablet 3   No current facility-administered medications on file prior to visit.    No Known Allergies Social History   Socioeconomic History  . Marital status:  Married    Spouse name: Not on file  . Number of children: Not on file  . Years of education: Not on file  . Highest education level: Not on file  Social Needs  . Financial resource strain: Not on file  . Food insecurity - worry: Not on file  . Food insecurity - inability: Not on file  . Transportation needs - medical: Not on file  . Transportation needs - non-medical: Not on file  Occupational History  . Not on file  Tobacco Use  . Smoking status: Former Smoker    Years: 15.00    Types: Cigarettes    Last attempt to quit: 03/03/2005    Years since quitting: 11.8  . Smokeless tobacco: Never Used  Substance and Sexual Activity  . Alcohol use: Yes    Alcohol/week: 2.4 oz    Types: 4 Cans of beer per week  . Drug use: No  . Sexual activity: Not on file  Other Topics Concern  . Not on file  Social History Narrative  . Not on file   Family History  Problem Relation Age of Onset  . Heart disease Father   . Heart failure Mother        died in sleep at 59 years   . Diabetes Paternal Aunt   . Heart disease Paternal Uncle   . Heart disease Paternal Grandfather    Office  Visit on 12/31/2016  Component Date Value Ref Range Status  . Brain Natriuretic Peptide 12/31/2016 377* <100 pg/mL Final   Comment: . BNP levels increase with age in the general population with the highest values seen in individuals greater than 17 years of age. Reference: J. Am. [place] Ar. Cardiol. 2002; 91:638-466. Marland Kitchen   Appointment on 12/27/2016  Component Date Value Ref Range Status  . WBC 12/27/2016 7.1  3.8 - 10.8 Thousand/uL Final  . RBC 12/27/2016 4.28  4.20 - 5.80 Million/uL Final  . Hemoglobin 12/27/2016 12.6* 13.2 - 17.1 g/dL Final  . HCT 12/27/2016 38.9  38.5 - 50.0 % Final  . MCV 12/27/2016 90.9  80.0 - 100.0 fL Final  . MCH 12/27/2016 29.4  27.0 - 33.0 pg Final  . MCHC 12/27/2016 32.4  32.0 - 36.0 g/dL Final  . RDW 12/27/2016 12.9  11.0 - 15.0 % Final  . Platelets 12/27/2016 241  140 - 400 Thousand/uL Final  . MPV 12/27/2016 11.2  7.5 - 12.5 fL Final  . Neutro Abs 12/27/2016 4,281  1,500 - 7,800 cells/uL Final  . Lymphs Abs 12/27/2016 1,796  850 - 3,900 cells/uL Final  . WBC mixed population 12/27/2016 710  200 - 950 cells/uL Final  . Eosinophils Absolute 12/27/2016 241  15 - 500 cells/uL Final  . Basophils Absolute 12/27/2016 71  0 - 200 cells/uL Final  . Neutrophils Relative % 12/27/2016 60.3  % Final  . Total Lymphocyte 12/27/2016 25.3  % Final  . Monocytes Relative 12/27/2016 10.0  % Final  . Eosinophils Relative 12/27/2016 3.4  % Final  . Basophils Relative 12/27/2016 1.0  % Final  . Glucose, Bld 12/27/2016 164* 65 - 99 mg/dL Final   Comment: .            Fasting reference interval . For someone without known diabetes, a glucose value >125 mg/dL indicates that they may have diabetes and this should be confirmed with a follow-up test. .   . BUN 12/27/2016 20  7 - 25 mg/dL Final  . Creat 12/27/2016 1.00  0.70 -  1.25 mg/dL Final   Comment: For patients >14 years of age, the reference limit for Creatinine is approximately 13% higher for people identified as  African-American. .   Havery Moros Ratio 67/20/9470 NOT APPLICABLE  6 - 22 (calc) Final  . Sodium 12/27/2016 137  135 - 146 mmol/L Final  . Potassium 12/27/2016 4.5  3.5 - 5.3 mmol/L Final  . Chloride 12/27/2016 99  98 - 110 mmol/L Final  . CO2 12/27/2016 31  20 - 32 mmol/L Final  . Calcium 12/27/2016 8.6  8.6 - 10.3 mg/dL Final  . Total Protein 12/27/2016 6.2  6.1 - 8.1 g/dL Final  . Albumin 12/27/2016 3.6  3.6 - 5.1 g/dL Final  . Globulin 12/27/2016 2.6  1.9 - 3.7 g/dL (calc) Final  . AG Ratio 12/27/2016 1.4  1.0 - 2.5 (calc) Final  . Total Bilirubin 12/27/2016 0.3  0.2 - 1.2 mg/dL Final  . Alkaline phosphatase (APISO) 12/27/2016 62  40 - 115 U/L Final  . AST 12/27/2016 10  10 - 35 U/L Final  . ALT 12/27/2016 8* 9 - 46 U/L Final  . Cholesterol 12/27/2016 101  <200 mg/dL Final  . HDL 12/27/2016 36* >40 mg/dL Final  . Triglycerides 12/27/2016 127  <150 mg/dL Final  . LDL Cholesterol (Calc) 12/27/2016 44  mg/dL (calc) Final   Comment: Reference range: <100 . Desirable range <100 mg/dL for primary prevention;   <70 mg/dL for patients with CHD or diabetic patients  with > or = 2 CHD risk factors. Marland Kitchen LDL-C is now calculated using the Martin-Hopkins  calculation, which is a validated novel method providing  better accuracy than the Friedewald equation in the  estimation of LDL-C.  Cresenciano Genre et al. Annamaria Helling. 9628;366(29): 2061-2068  (http://education.[facility].com/faq/FAQ164)   . Total CHOL/HDL Ratio 12/27/2016 2.8  <5.0 (calc) Final  . Non-HDL Cholesterol (Calc) 12/27/2016 65  <130 mg/dL (calc) Final   Comment: For patients with diabetes plus 1 major ASCVD risk  factor, treating to a non-HDL-C goal of <100 mg/dL  (LDL-C of <70 mg/dL) is considered a therapeutic  option.   . Hgb A1c MFr Bld 12/27/2016 7.2* <5.7 % of total Hgb Final   Comment: For someone without known diabetes, a hemoglobin A1c value of 6.5% or greater indicates that they may have  diabetes and this should  be confirmed with a follow-up  test. . For someone with known diabetes, a value <7% indicates  that their diabetes is well controlled and a value  greater than or equal to 7% indicates suboptimal  control. A1c targets should be individualized based on  duration of diabetes, age, comorbid conditions, and  other considerations. . Currently, no consensus exists regarding use of hemoglobin A1c for diagnosis of diabetes for children. .   . Mean Plasma Glucose 12/27/2016 160  (calc) Final  . eAG (mmol/L) 12/27/2016 8.9  (calc) Final      Review of Systems  All other systems reviewed and are negative.      Objective:   Physical Exam  Constitutional: He appears well-developed and well-nourished. No distress.  Eyes: Conjunctivae and EOM are normal. Pupils are equal, round, and reactive to light. Right eye exhibits no discharge. Left eye exhibits no discharge. No scleral icterus.  Neck: Normal range of motion. Neck supple. No JVD present. No tracheal deviation present.  Cardiovascular: Normal rate and normal heart sounds. An irregularly irregular rhythm present. Exam reveals no gallop and no friction rub.  No murmur heard. Pulmonary/Chest: Effort normal and  breath sounds normal. No stridor. No respiratory distress. He has no wheezes. He has no rales. He exhibits no tenderness.  Abdominal: Soft. Bowel sounds are normal.  Musculoskeletal: Normal range of motion. He exhibits no edema.  Skin: He is not diaphoretic.  Vitals reviewed.         Assessment & Plan:  Dyspnea on exertion - Plan: BASIC METABOLIC PANEL WITH GFR  I am suspicious that the patient may have a suppressed ejection fraction or at least diastolic dysfunction. I recommended an echocardiogram to evaluate further to that we can optimize medical therapy. I gave the patient contact information so that he could call and schedule the appointment so that we can get his echocardiogram. Meanwhile I will continue the patient on  Lasix as he seems to be symptomatically doing better. I will recheck a BMP to monitor his renal function and potassium on the new medication given the contact information

## 2017-01-10 LAB — BASIC METABOLIC PANEL WITH GFR
BUN: 22 mg/dL (ref 7–25)
CO2: 30 mmol/L (ref 20–32)
CREATININE: 1.06 mg/dL (ref 0.70–1.25)
Calcium: 9.5 mg/dL (ref 8.6–10.3)
Chloride: 100 mmol/L (ref 98–110)
GFR, Est African American: 83 mL/min/{1.73_m2} (ref >=60)
GFR, Est Non African American: 71 mL/min/{1.73_m2} (ref >=60)
GLUCOSE: 141 mg/dL — AB (ref 65–99)
POTASSIUM: 4.2 mmol/L (ref 3.5–5.3)
SODIUM: 137 mmol/L (ref 135–146)

## 2017-01-16 ENCOUNTER — Ambulatory Visit (HOSPITAL_COMMUNITY): Payer: PPO | Attending: Cardiology

## 2017-01-16 ENCOUNTER — Other Ambulatory Visit: Payer: Self-pay

## 2017-01-16 DIAGNOSIS — Z6829 Body mass index (BMI) 29.0-29.9, adult: Secondary | ICD-10-CM | POA: Insufficient documentation

## 2017-01-16 DIAGNOSIS — Z87891 Personal history of nicotine dependence: Secondary | ICD-10-CM | POA: Insufficient documentation

## 2017-01-16 DIAGNOSIS — R0609 Other forms of dyspnea: Secondary | ICD-10-CM | POA: Diagnosis not present

## 2017-01-16 DIAGNOSIS — I4891 Unspecified atrial fibrillation: Secondary | ICD-10-CM | POA: Insufficient documentation

## 2017-01-16 DIAGNOSIS — I082 Rheumatic disorders of both aortic and tricuspid valves: Secondary | ICD-10-CM | POA: Diagnosis not present

## 2017-01-16 DIAGNOSIS — I10 Essential (primary) hypertension: Secondary | ICD-10-CM | POA: Diagnosis not present

## 2017-01-16 DIAGNOSIS — Z8249 Family history of ischemic heart disease and other diseases of the circulatory system: Secondary | ICD-10-CM | POA: Insufficient documentation

## 2017-01-16 DIAGNOSIS — E785 Hyperlipidemia, unspecified: Secondary | ICD-10-CM | POA: Insufficient documentation

## 2017-01-16 DIAGNOSIS — E119 Type 2 diabetes mellitus without complications: Secondary | ICD-10-CM | POA: Diagnosis not present

## 2017-01-16 DIAGNOSIS — R609 Edema, unspecified: Secondary | ICD-10-CM | POA: Diagnosis not present

## 2017-01-16 DIAGNOSIS — E669 Obesity, unspecified: Secondary | ICD-10-CM | POA: Insufficient documentation

## 2017-02-17 ENCOUNTER — Other Ambulatory Visit: Payer: Self-pay | Admitting: Family Medicine

## 2017-02-17 DIAGNOSIS — Z23 Encounter for immunization: Secondary | ICD-10-CM

## 2017-02-17 DIAGNOSIS — R009 Unspecified abnormalities of heart beat: Secondary | ICD-10-CM

## 2017-02-27 ENCOUNTER — Ambulatory Visit (INDEPENDENT_AMBULATORY_CARE_PROVIDER_SITE_OTHER): Payer: PPO | Admitting: Family Medicine

## 2017-02-27 ENCOUNTER — Encounter: Payer: Self-pay | Admitting: Family Medicine

## 2017-02-27 VITALS — BP 110/64 | HR 52 | Temp 97.9°F | Resp 18 | Ht 68.0 in | Wt 198.0 lb

## 2017-02-27 DIAGNOSIS — R0609 Other forms of dyspnea: Secondary | ICD-10-CM | POA: Diagnosis not present

## 2017-02-27 NOTE — Progress Notes (Signed)
Subjective:    Patient ID: Charles Bailey, male    DOB: 1945/03/07, 72 y.o.   MRN: [id number]  HPI  06/29/15 Here today for complete physical exam.   Last colonoscopy 02/2011 was significant for 5 polyps. Recommendations were made to repeat colonoscopy in 3 years and therefore he is overdue.  He is also due for the shingles vaccine as well as hepatitis C screening. Diabetic eye exam is up-to-date.  His wife has been battling brain cancer. He admits that due to the stress of this they have been more liberal in her diet and been eating ice cream almost on a daily basis. Because of this reason he declines a colonoscopy as he is just too busy at the present time caring for her. He would like to try diet and exercise to address his sugars.  AT that time, my plan was: We will defer the colonoscopy at the present time. Readdress next year. At the present time he is busy caring for his wife is dying from cancer. Blood pressure is excellent. Glycemic control is suboptimal but he would like to try to address this with diet exercise and weight loss. Cholesterol is excellent. I will add a PSA to his lab work. The remainder of his exam is normal. He declines hepatitis C screening  01/01/16  Patient is not checking his blood work. However he denies any hypoglycemic episodes. He denies any chest pain or shortness of breath. His blood pressure is still very high today. He is here mainly for lab work. He denies any myalgias or right upper quadrant pain. He is still caring for his wife who has terminal brain cancer and he is due for his flu shot.  At that time, my plan was: Patient's blood pressure is elevated today. Increase amlodipine to 10 mg a day and recheck blood pressure in one month. Patient is still in atrial fibrillation. I gave him samples of Xarelto as he can no longer afford pradaxa.  Check cholesterol. Goal LDL cholesterol is less than 100. Patient received his flu shot today. Heart rate is well controlled.  Check hemoglobin A1c. Goal hemoglobin A1c is less than 7. We did discuss dietary changes to help achieve this  12/31/16 Patient has not been seen in a year. He is here today scheduled for complete physical exam however he has concerning history. He reports gradually worsening dyspnea on exertion. He denies any orthopnea. He denies any paroxysmal nocturnal dyspnea however he has witnessed increased swelling in both legs. He has +1 pitting edema in both legs to the level of his knee. On exam, he also has bibasilar crackles posteriorly in both lungs. There is no JVD on exam. He denies any chest pain. He denies any hemoptysis. He denies any cough. He does have a history of 30 pack years of smoking but he quit almost 10 years ago. EKG was obtained today in the office which revealed atrial fibrillation with a variable heart rate between 50 and 60 bpm there were nonspecific ST changes in the inferolateral leads that are chronic. Compared to his EKG from 2016 there were no significant changes. His blood pressure today is elevated at 160/78. His most recent lab work is listed below.  AT that time, my plan was: Despite being here for complete physical exam, I focused his visit on dyspnea on exertion. He appears fluid overloaded today. He has +1 pitting edema in both legs and crackles in both lungs. I will check a BNP to evaluate  for signs of congestive heart failure. I will also schedule a chest x-ray to rule out bibasilar pneumonia and to determine if the patient has pulmonary edema. I will start him on Lasix 40 mg a day in addition to potassium 20 mEq a day and schedule the patient for an echocardiogram. I will see the patient back in one week for follow-up to recheck his blood pressure after diuresis and see if his dyspnea on exertion is improving. If chest x-ray is negative, if echo is negative for heart failure, if BNP is normal, I will screen the patient with pulmonary function test for underlying possible COPD.  Diabetes is not adequately controlled however I want to focus on the cause of his dyspnea on exertion first. He may be a good candidate for Jardiance in the future.  Patient received his flu shot today  01/09/17 Patient states that the shortness of breath has improved since starting Lasix. His weight is essentially the same however the edema in his legs has improved. Furthermore, the bibasilar crackles have also improved. Chest x-ray showed no acute cardiopulmonary process. However his lab work was significant for an elevated BNP of 377. I have recommended and scheduled an echocardiogram of heart however the patient has missed his scheduled appointment yesterday.  At that time, my plan was: I am suspicious that the patient may have a suppressed ejection fraction or at least diastolic dysfunction. I recommended an echocardiogram to evaluate further to that we can optimize medical therapy. I gave the patient contact information so that he could call and schedule the appointment so that we can get his echocardiogram. Meanwhile I will continue the patient on Lasix as he seems to be symptomatically doing better. I will recheck a BMP to monitor his renal function and potassium on the new medication given the contact information  02/27/17 Patient is here today for follow-up.  He is still taking Lasix 40 mg daily.  He states that his dyspnea on exertion is better.  His weight is actually up 3-4 pounds since I last saw him.  He denies any orthopnea or paroxysmal nocturnal dyspnea.  The bibasilar crackles are no longer present in his lungs.  He denies any chest pain.  Lab work originally was significant for an elevated BNP of 377.  He is here today to recheck his lab work and to monitor the management of his dyspnea on exertion on the Lasix.  Echocardiogram revealed a normal ejection fraction.  Due to atrial fibrillation they were not able to assess for diastolic dysfunction however I anticipate that he has diastolic  dysfunction causing his dyspnea on exertion and elevated BNP.  Past Medical History:  Diagnosis Date  . Adenomatous colon polyp 2003  . Diabetes mellitus   . Hyperlipidemia   . Hypertension    Past Surgical History:  Procedure Laterality Date  . ABDOMINAL AORTIC ANEURYSM REPAIR  2007  . CARDIOVERSION N/A 03/29/2014   Procedure: CARDIOVERSION;  Surgeon: Josue Hector, MD;  Location: [facility] ENDOSCOPY;  Service: Cardiovascular;  Laterality: N/A;  . [place]   right   Current Outpatient Medications on File Prior to Visit  Medication Sig Dispense Refill  . amLODipine (NORVASC) 10 MG tablet Take 1 tablet by mouth once daily (need to be seen before any further refills) 90 tablet 2  . Cholecalciferol (VITAMIN D3) 1000 UNITS CAPS Take 2,000 Units by mouth daily.     . cloNIDine (CATAPRES) 0.2 MG tablet Take 1 tablet (0.2 mg  total) by mouth 2 (two) times daily. 180 tablet 3  . fish oil-omega-3 fatty acids 1000 MG capsule Take 1,200 mg by mouth daily.    . furosemide (LASIX) 40 MG tablet Take 1 tablet (40 mg total) by mouth daily. 30 tablet 3  . lisinopril (PRINIVIL,ZESTRIL) 40 MG tablet Take 1 tablet (40 mg total) by mouth daily. 90 tablet 3  . metoprolol tartrate (LOPRESSOR) 50 MG tablet Take 1 tablet (50 mg total) by mouth 2 (two) times daily. 180 tablet 3  . potassium chloride SA (K-DUR,KLOR-CON) 20 MEQ tablet Take 1 tablet (20 mEq total) by mouth daily. 30 tablet 3  . pravastatin (PRAVACHOL) 40 MG tablet Take 1 tablet (40 mg total) by mouth daily. 90 tablet 3  . rivaroxaban (XARELTO) 20 MG TABS tablet Take 1 tablet (20 mg total) by mouth daily with supper. 90 tablet 3  . metFORMIN (GLUCOPHAGE) 1000 MG tablet Take 1 tablet by mouth twice a day with meals 180 tablet 2   No current facility-administered medications on file prior to visit.    No Known Allergies Social History   Socioeconomic History  . Marital status: Married    Spouse name: Not on file  . Number of  children: Not on file  . Years of education: Not on file  . Highest education level: Not on file  Social Needs  . Financial resource strain: Not on file  . Food insecurity - worry: Not on file  . Food insecurity - inability: Not on file  . Transportation needs - medical: Not on file  . Transportation needs - non-medical: Not on file  Occupational History  . Not on file  Tobacco Use  . Smoking status: Former Smoker    Years: 15.00    Types: Cigarettes    Last attempt to quit: 03/03/2005    Years since quitting: 11.9  . Smokeless tobacco: Never Used  Substance and Sexual Activity  . Alcohol use: Yes    Alcohol/week: 2.4 oz    Types: 4 Cans of beer per week  . Drug use: No  . Sexual activity: Not on file  Other Topics Concern  . Not on file  Social History Narrative  . Not on file   Family History  Problem Relation Age of Onset  . Heart disease Father   . Heart failure Mother        died in sleep at 48 years   . Diabetes Paternal Aunt   . Heart disease Paternal Uncle   . Heart disease Paternal Grandfather    No visits with results within 1 Month(s) from this visit.  Latest known visit with results is:  Office Visit on 01/09/2017  Component Date Value Ref Range Status  . Glucose, Bld 01/09/2017 141* 65 - 99 mg/dL Final   Comment: .            Fasting reference interval . For someone without known diabetes, a glucose value >125 mg/dL indicates that they may have diabetes and this should be confirmed with a follow-up test. .   . BUN 01/09/2017 22  7 - 25 mg/dL Final  . Creat 01/09/2017 1.06  0.70 - 1.25 mg/dL Final   Comment: For patients >72 years of age, the reference limit for Creatinine is approximately 13% higher for people identified as African-American. .   . GFR, Est Non African American 01/09/2017 71  > OR = 60 mL/min/1.21m2 Final  . GFR, Est African American 01/09/2017 83  > OR =  60 mL/min/1.78m2 Final  . BUN/Creatinine Ratio 81/15/7262 NOT APPLICABLE   6 - 22 (calc) Final  . Sodium 01/09/2017 137  135 - 146 mmol/L Final  . Potassium 01/09/2017 4.2  3.5 - 5.3 mmol/L Final  . Chloride 01/09/2017 100  98 - 110 mmol/L Final  . CO2 01/09/2017 30  20 - 32 mmol/L Final  . Calcium 01/09/2017 9.5  8.6 - 10.3 mg/dL Final      Review of Systems  All other systems reviewed and are negative.      Objective:   Physical Exam  Constitutional: He appears well-developed and well-nourished. No distress.  Eyes: Conjunctivae and EOM are normal. Pupils are equal, round, and reactive to light. Right eye exhibits no discharge. Left eye exhibits no discharge. No scleral icterus.  Neck: Normal range of motion. Neck supple. No JVD present. No tracheal deviation present.  Cardiovascular: Normal rate and normal heart sounds. An irregularly irregular rhythm present. Exam reveals no gallop and no friction rub.  No murmur heard. Pulmonary/Chest: Effort normal and breath sounds normal. No stridor. No respiratory distress. He has no wheezes. He has no rales. He exhibits no tenderness.  Abdominal: Soft. Bowel sounds are normal.  Musculoskeletal: Normal range of motion. He exhibits no edema.  Skin: He is not diaphoretic.  Vitals reviewed.         Assessment & Plan:  Dyspnea on exertion - Plan: BASIC METABOLIC PANEL WITH GFR, Brain natriuretic peptide  Symptomatically, the patient is doing better on Lasix.  Recheck BMP today to monitor renal function and potassium and recheck BNP.  If the patient symptomatically feels better on Lasix, if his BNP is better on Lasix, and if his renal function and potassium are tolerating Lasix, I would continue the patient on Lasix 40 mg a day for fluid retention secondary to diastolic dysfunction as well as atrial fibrillation.  I reviewed the patient's hemoglobin A1c which was 7.2 in December.  I recommended therapeutic lifestyle changes including daily aerobic exercise and weight loss as well as a low carbohydrate diet and then  recheck lab work in 3 months.  Diabetic foot exam was performed today and it was significant for onychomycosis in his left great toenail on the medial aspect of the toenail along with some onycholysis.  At the present time the patient denies any pain from this and therefore we will not proceed with podiatry consult for removal

## 2017-02-28 ENCOUNTER — Ambulatory Visit: Payer: PPO | Admitting: Family Medicine

## 2017-02-28 LAB — BASIC METABOLIC PANEL WITH GFR
BUN: 21 mg/dL (ref 7–25)
CALCIUM: 9 mg/dL (ref 8.6–10.3)
CO2: 26 mmol/L (ref 20–32)
CREATININE: 1.11 mg/dL (ref 0.70–1.25)
Chloride: 100 mmol/L (ref 98–110)
GFR, Est African American: 78 mL/min/{1.73_m2} (ref >=60)
GFR, Est Non African American: 67 mL/min/{1.73_m2} (ref >=60)
GLUCOSE: 210 mg/dL — AB (ref 65–99)
POTASSIUM: 5.2 mmol/L (ref 3.5–5.3)
SODIUM: 134 mmol/L — AB (ref 135–146)

## 2017-02-28 LAB — EXTRA LAV TOP TUBE

## 2017-02-28 LAB — BRAIN NATRIURETIC PEPTIDE: BRAIN NATRIURETIC PEPTIDE: 323 pg/mL — AB (ref <100)

## 2017-04-09 ENCOUNTER — Other Ambulatory Visit: Payer: Self-pay | Admitting: Family Medicine

## 2017-04-09 DIAGNOSIS — Z23 Encounter for immunization: Secondary | ICD-10-CM

## 2017-04-09 DIAGNOSIS — R009 Unspecified abnormalities of heart beat: Secondary | ICD-10-CM

## 2017-04-09 DIAGNOSIS — Z0000 Encounter for general adult medical examination without abnormal findings: Secondary | ICD-10-CM

## 2017-04-21 ENCOUNTER — Other Ambulatory Visit: Payer: Self-pay | Admitting: Family Medicine

## 2017-04-21 MED ORDER — RIVAROXABAN 20 MG PO TABS: 20.0000 mg | ORAL_TABLET | Freq: Every day | ORAL | 3 refills | Status: DC

## 2017-04-23 ENCOUNTER — Other Ambulatory Visit: Payer: Self-pay | Admitting: Family Medicine

## 2017-05-02 ENCOUNTER — Other Ambulatory Visit: Payer: Self-pay | Admitting: Family Medicine

## 2017-05-02 DIAGNOSIS — R009 Unspecified abnormalities of heart beat: Secondary | ICD-10-CM

## 2017-05-02 DIAGNOSIS — Z0000 Encounter for general adult medical examination without abnormal findings: Secondary | ICD-10-CM

## 2017-05-02 DIAGNOSIS — Z23 Encounter for immunization: Secondary | ICD-10-CM

## 2017-05-20 DIAGNOSIS — E119 Type 2 diabetes mellitus without complications: Secondary | ICD-10-CM | POA: Diagnosis not present

## 2017-05-20 DIAGNOSIS — Z7984 Long term (current) use of oral hypoglycemic drugs: Secondary | ICD-10-CM | POA: Diagnosis not present

## 2017-05-20 DIAGNOSIS — Z961 Presence of intraocular lens: Secondary | ICD-10-CM | POA: Diagnosis not present

## 2017-05-20 LAB — HM DIABETES EYE EXAM

## 2017-05-28 ENCOUNTER — Other Ambulatory Visit: Payer: PPO

## 2017-05-28 DIAGNOSIS — E119 Type 2 diabetes mellitus without complications: Secondary | ICD-10-CM | POA: Diagnosis not present

## 2017-05-28 DIAGNOSIS — E785 Hyperlipidemia, unspecified: Secondary | ICD-10-CM | POA: Diagnosis not present

## 2017-05-28 DIAGNOSIS — I10 Essential (primary) hypertension: Secondary | ICD-10-CM

## 2017-05-28 DIAGNOSIS — Z0000 Encounter for general adult medical examination without abnormal findings: Secondary | ICD-10-CM | POA: Diagnosis not present

## 2017-05-29 LAB — CBC WITH DIFFERENTIAL/PLATELET
BASOS ABS: 49 {cells}/uL (ref 0–200)
Basophils Relative: 0.9 %
EOS ABS: 248 {cells}/uL (ref 15–500)
EOS PCT: 4.6 %
HCT: 38.8 % (ref 38.5–50.0)
Hemoglobin: 12.9 g/dL — ABNORMAL LOW (ref 13.2–17.1)
Lymphs Abs: 1490 cells/uL (ref 850–3900)
MCH: 29.5 pg (ref 27.0–33.0)
MCHC: 33.2 g/dL (ref 32.0–36.0)
MCV: 88.8 fL (ref 80.0–100.0)
MONOS PCT: 12.8 %
MPV: 11.3 fL (ref 7.5–12.5)
NEUTROS PCT: 54.1 %
Neutro Abs: 2921 cells/uL (ref 1500–7800)
Platelets: 136 10*3/uL — ABNORMAL LOW (ref 140–400)
RBC: 4.37 10*6/uL (ref 4.20–5.80)
RDW: 13 % (ref 11.0–15.0)
Total Lymphocyte: 27.6 %
WBC mixed population: 691 cells/uL (ref 200–950)
WBC: 5.4 10*3/uL (ref 3.8–10.8)

## 2017-05-29 LAB — COMPLETE METABOLIC PANEL WITH GFR
AG Ratio: 1.8 (calc) (ref 1.0–2.5)
ALBUMIN MSPROF: 4.2 g/dL (ref 3.6–5.1)
ALKALINE PHOSPHATASE (APISO): 57 U/L (ref 40–115)
ALT: 9 U/L (ref 9–46)
AST: 11 U/L (ref 10–35)
BILIRUBIN TOTAL: 0.6 mg/dL (ref 0.2–1.2)
BUN / CREAT RATIO: 19 (calc) (ref 6–22)
BUN: 26 mg/dL — AB (ref 7–25)
CHLORIDE: 100 mmol/L (ref 98–110)
CO2: 27 mmol/L (ref 20–32)
CREATININE: 1.4 mg/dL — AB (ref 0.70–1.25)
Calcium: 9.2 mg/dL (ref 8.6–10.3)
GFR, Est African American: 59 mL/min/{1.73_m2} — ABNORMAL LOW (ref >=60)
GFR, Est Non African American: 51 mL/min/{1.73_m2} — ABNORMAL LOW (ref >=60)
GLOBULIN: 2.4 g/dL (ref 1.9–3.7)
GLUCOSE: 168 mg/dL — AB (ref 65–99)
Potassium: 4.4 mmol/L (ref 3.5–5.3)
SODIUM: 136 mmol/L (ref 135–146)
Total Protein: 6.6 g/dL (ref 6.1–8.1)

## 2017-05-29 LAB — HEMOGLOBIN A1C
Hgb A1c MFr Bld: 7.3 %{Hb} — ABNORMAL HIGH
Mean Plasma Glucose: 163 (calc)
eAG (mmol/L): 9 (calc)

## 2017-05-29 LAB — LIPID PANEL
CHOL/HDL RATIO: 3 (calc) (ref <5.0)
Cholesterol: 114 mg/dL (ref <200)
HDL: 38 mg/dL — AB (ref >40)
LDL Cholesterol (Calc): 49 mg/dL (calc)
Non-HDL Cholesterol (Calc): 76 mg/dL (calc) (ref <130)
Triglycerides: 205 mg/dL — ABNORMAL HIGH (ref <150)

## 2017-06-03 ENCOUNTER — Encounter: Payer: Self-pay | Admitting: Family Medicine

## 2017-06-03 ENCOUNTER — Other Ambulatory Visit: Payer: Self-pay

## 2017-06-03 ENCOUNTER — Ambulatory Visit (INDEPENDENT_AMBULATORY_CARE_PROVIDER_SITE_OTHER): Payer: PPO | Admitting: Family Medicine

## 2017-06-03 VITALS — BP 148/82 | HR 64 | Temp 98.2°F | Wt 194.0 lb

## 2017-06-03 DIAGNOSIS — I482 Chronic atrial fibrillation: Secondary | ICD-10-CM

## 2017-06-03 DIAGNOSIS — N289 Disorder of kidney and ureter, unspecified: Secondary | ICD-10-CM

## 2017-06-03 DIAGNOSIS — I10 Essential (primary) hypertension: Secondary | ICD-10-CM

## 2017-06-03 DIAGNOSIS — E119 Type 2 diabetes mellitus without complications: Secondary | ICD-10-CM | POA: Diagnosis not present

## 2017-06-03 DIAGNOSIS — I4821 Permanent atrial fibrillation: Secondary | ICD-10-CM

## 2017-06-03 NOTE — Progress Notes (Signed)
Subjective:    Patient ID: Charles Bailey, male    DOB: 1945-11-16, 72 y.o.   MRN: [id number]  HPI  After the patient's last visit in February, he was maintained on Lasix 40 mg a day due to his peripheral edema and dyspnea on exertion and elevated BNP.  Recently he had lab work drawn to assess his glycemic control for diabetes.  His creatinine was elevated at 1.4 up from his baseline of 1.1 in February suggesting acute renal insufficiency.  Patient is here today to discuss.  He denies any chest pain.  He denies any dyspnea on exertion.  He remains in atrial fibrillation however he denies any symptomatic arrhythmias or syncope.  He denies any NSAID use.  He admits that he has not been drinking enough water.  He is compliant with his diuretic.  His blood pressure is elevated at 148/82.  He is compliant taking clonidine, lisinopril, hydrochlorothiazide, and metoprolol.  His heart rate today is well controlled at 64 bpm and he is appropriately anticoagulated for his atrial fibrillation.  His most recent lab work as listed below: Lab on 05/28/2017  Component Date Value Ref Range Status  . WBC 05/28/2017 5.4  3.8 - 10.8 Thousand/uL Final  . RBC 05/28/2017 4.37  4.20 - 5.80 Million/uL Final  . Hemoglobin 05/28/2017 12.9* 13.2 - 17.1 g/dL Final  . HCT 05/28/2017 38.8  38.5 - 50.0 % Final  . MCV 05/28/2017 88.8  80.0 - 100.0 fL Final  . MCH 05/28/2017 29.5  27.0 - 33.0 pg Final  . MCHC 05/28/2017 33.2  32.0 - 36.0 g/dL Final  . RDW 05/28/2017 13.0  11.0 - 15.0 % Final  . Platelets 05/28/2017 136* 140 - 400 Thousand/uL Final  . MPV 05/28/2017 11.3  7.5 - 12.5 fL Final  . Neutro Abs 05/28/2017 2,921  1,500 - 7,800 cells/uL Final  . Lymphs Abs 05/28/2017 1,490  850 - 3,900 cells/uL Final  . WBC mixed population 05/28/2017 691  200 - 950 cells/uL Final  . Eosinophils Absolute 05/28/2017 248  15 - 500 cells/uL Final  . Basophils Absolute 05/28/2017 49  0 - 200 cells/uL Final  . Neutrophils Relative %  05/28/2017 54.1  % Final  . Total Lymphocyte 05/28/2017 27.6  % Final  . Monocytes Relative 05/28/2017 12.8  % Final  . Eosinophils Relative 05/28/2017 4.6  % Final  . Basophils Relative 05/28/2017 0.9  % Final  . Glucose, Bld 05/28/2017 168* 65 - 99 mg/dL Final   Comment: .            Fasting reference interval . For someone without known diabetes, a glucose value >125 mg/dL indicates that they may have diabetes and this should be confirmed with a follow-up test. .   . BUN 05/28/2017 26* 7 - 25 mg/dL Final  . Creat 05/28/2017 1.40* 0.70 - 1.25 mg/dL Final   Comment: For patients >36 years of age, the reference limit for Creatinine is approximately 13% higher for people identified as African-American. .   . GFR, Est Non African American 05/28/2017 51* > OR = 60 mL/min/1.36m2 Final  . GFR, Est African American 05/28/2017 59* > OR = 60 mL/min/1.46m2 Final  . BUN/Creatinine Ratio 05/28/2017 19  6 - 22 (calc) Final  . Sodium 05/28/2017 136  135 - 146 mmol/L Final  . Potassium 05/28/2017 4.4  3.5 - 5.3 mmol/L Final  . Chloride 05/28/2017 100  98 - 110 mmol/L Final  . CO2 05/28/2017 27  20 -  32 mmol/L Final  . Calcium 05/28/2017 9.2  8.6 - 10.3 mg/dL Final  . Total Protein 05/28/2017 6.6  6.1 - 8.1 g/dL Final  . Albumin 05/28/2017 4.2  3.6 - 5.1 g/dL Final  . Globulin 05/28/2017 2.4  1.9 - 3.7 g/dL (calc) Final  . AG Ratio 05/28/2017 1.8  1.0 - 2.5 (calc) Final  . Total Bilirubin 05/28/2017 0.6  0.2 - 1.2 mg/dL Final  . Alkaline phosphatase (APISO) 05/28/2017 57  40 - 115 U/L Final  . AST 05/28/2017 11  10 - 35 U/L Final  . ALT 05/28/2017 9  9 - 46 U/L Final  . Hgb A1c MFr Bld 05/28/2017 7.3* <5.7 % of total Hgb Final   Comment: For someone without known diabetes, a hemoglobin A1c value of 6.5% or greater indicates that they may have  diabetes and this should be confirmed with a follow-up  test. . For someone with known diabetes, a value <7% indicates  that their diabetes is  well controlled and a value  greater than or equal to 7% indicates suboptimal  control. A1c targets should be individualized based on  duration of diabetes, age, comorbid conditions, and  other considerations. . Currently, no consensus exists regarding use of hemoglobin A1c for diagnosis of diabetes for children. .   . Mean Plasma Glucose 05/28/2017 163  (calc) Final  . eAG (mmol/L) 05/28/2017 9.0  (calc) Final  . Cholesterol 05/28/2017 114  <200 mg/dL Final  . HDL 05/28/2017 38* >40 mg/dL Final  . Triglycerides 05/28/2017 205* <150 mg/dL Final  . LDL Cholesterol (Calc) 05/28/2017 49  mg/dL (calc) Final   Comment: Reference range: <100 . Desirable range <100 mg/dL for primary prevention;   <70 mg/dL for patients with CHD or diabetic patients  with > or = 2 CHD risk factors. Marland Kitchen LDL-C is now calculated using the Martin-Hopkins  calculation, which is a validated novel method providing  better accuracy than the Friedewald equation in the  estimation of LDL-C.  Cresenciano Genre et al. Annamaria Helling. 9326;712(45): 2061-2068  (http://education.[facility].com/faq/FAQ164)   . Total CHOL/HDL Ratio 05/28/2017 3.0  <5.0 (calc) Final  . Non-HDL Cholesterol (Calc) 05/28/2017 76  <130 mg/dL (calc) Final   Comment: For patients with diabetes plus 1 major ASCVD risk  factor, treating to a non-HDL-C goal of <100 mg/dL  (LDL-C of <70 mg/dL) is considered a therapeutic  option.      Past Medical History:  Diagnosis Date  . Adenomatous colon polyp 2003  . Diabetes mellitus   . Hyperlipidemia   . Hypertension    Past Surgical History:  Procedure Laterality Date  . ABDOMINAL AORTIC ANEURYSM REPAIR  2007  . CARDIOVERSION N/A 03/29/2014   Procedure: CARDIOVERSION;  Surgeon: Josue Hector, MD;  Location: [facility] ENDOSCOPY;  Service: Cardiovascular;  Laterality: N/A;  . [place]   right   Current Outpatient Medications on File Prior to Visit  Medication Sig Dispense Refill  .  amLODipine (NORVASC) 10 MG tablet Take 1 tablet by mouth once daily (need to be seen before any further refills) 90 tablet 2  . Cholecalciferol (VITAMIN D3) 1000 UNITS CAPS Take 2,000 Units by mouth daily.     . cloNIDine (CATAPRES) 0.2 MG tablet Take 1 tablet (0.2 mg total) by mouth 2 (two) times daily. 180 tablet 3  . fish oil-omega-3 fatty acids 1000 MG capsule Take 1,200 mg by mouth daily.    . furosemide (LASIX) 40 MG tablet TAKE 1 TABLET BY MOUTH  EVERY DAY 30 tablet 3  . KLOR-CON M20 20 MEQ tablet TAKE 1 TABLET BY MOUTH EVERY DAY 30 tablet 3  . lisinopril (PRINIVIL,ZESTRIL) 40 MG tablet Take 1 tablet by mouth every day 90 tablet 2  . metFORMIN (GLUCOPHAGE) 1000 MG tablet Take 1 tablet by mouth twice a day with meals 180 tablet 2  . metoprolol tartrate (LOPRESSOR) 50 MG tablet Take 1 tablet (50 mg total) by mouth 2 (two) times daily. 180 tablet 3  . pravastatin (PRAVACHOL) 40 MG tablet Take 1 tablet by mouth daily 90 tablet 2  . rivaroxaban (XARELTO) 20 MG TABS tablet Take 1 tablet (20 mg total) by mouth daily with supper. 90 tablet 3   No current facility-administered medications on file prior to visit.    No Known Allergies Social History   Socioeconomic History  . Marital status: Married    Spouse name: Not on file  . Number of children: Not on file  . Years of education: Not on file  . Highest education level: Not on file  Occupational History  . Not on file  Social Needs  . Financial resource strain: Not on file  . Food insecurity:    Worry: Not on file    Inability: Not on file  . Transportation needs:    Medical: Not on file    Non-medical: Not on file  Tobacco Use  . Smoking status: Former Smoker    Years: 15.00    Types: Cigarettes    Last attempt to quit: 03/03/2005    Years since quitting: 12.2  . Smokeless tobacco: Never Used  Substance and Sexual Activity  . Alcohol use: Yes    Alcohol/week: 2.4 oz    Types: 4 Cans of beer per week  . Drug use: No  .  Sexual activity: Not on file  Lifestyle  . Physical activity:    Days per week: Not on file    Minutes per session: Not on file  . Stress: Not on file  Relationships  . Social connections:    Talks on phone: Not on file    Gets together: Not on file    Attends religious service: Not on file    Active member of club or organization: Not on file    Attends meetings of clubs or organizations: Not on file    Relationship status: Not on file  . Intimate partner violence:    Fear of current or ex partner: Not on file    Emotionally abused: Not on file    Physically abused: Not on file    Forced sexual activity: Not on file  Other Topics Concern  . Not on file  Social History Narrative  . Not on file   Family History  Problem Relation Age of Onset  . Heart disease Father   . Heart failure Mother        died in sleep at 3 years   . Diabetes Paternal Aunt   . Heart disease Paternal Uncle   . Heart disease Paternal Grandfather       Review of Systems  All other systems reviewed and are negative.      Objective:   Physical Exam  Constitutional: He appears well-developed and well-nourished. No distress.  Eyes: Pupils are equal, round, and reactive to light. Conjunctivae and EOM are normal. Right eye exhibits no discharge. Left eye exhibits no discharge. No scleral icterus.  Neck: Normal range of motion. Neck supple. No JVD present. No tracheal deviation  present.  Cardiovascular: Normal rate and normal heart sounds. An irregularly irregular rhythm present. Exam reveals no gallop and no friction rub.  No murmur heard. Pulmonary/Chest: Effort normal and breath sounds normal. No stridor. No respiratory distress. He has no wheezes. He has no rales. He exhibits no tenderness.  Abdominal: Soft. Bowel sounds are normal.  Musculoskeletal: Normal range of motion. He exhibits no edema.  Skin: He is not diaphoretic.  Vitals reviewed.         Assessment & Plan:  Controlled type 2  diabetes mellitus without complication, without long-term current use of insulin (HCC)  Essential hypertension  Permanent atrial fibrillation (HCC)  Renal insufficiency  Diabetes is fairly controlled with a hemoglobin A1c of 7.3.  Ideally I like to see his hemoglobin A1c less than 7.  Patient is refractory to starting any new medication.  He drinks sweet tea 2-3 times a day.  He is also not exercising and his weight is elevated.  Patient would like to decrease the amount of sugar in his diet and try to lose weight and then recheck lab work over the next 3 to 6 months.  His blood pressure is elevated today.  Rather than start new medication, he would like to check his blood pressure every day at home and report the values to me in 1 week.  His heart rate is adequately controlled.  He is appropriately anticoagulated therefore we will make no changes in his management strategy for atrial fibrillation.  His renal function is elevated and I believe this is likely prerenal azotemia.  I have recommended discontinuation of Lasix and then recheck a BMP in 1 week.  If renal function is persistently elevated, I would obtain a urinalysis and a renal ultrasound.  If his renal function improves in 1 week, I would recommend using Lasix only on an as-needed basis and not as a standing dose

## 2017-06-10 ENCOUNTER — Other Ambulatory Visit: Payer: PPO

## 2017-06-10 DIAGNOSIS — N289 Disorder of kidney and ureter, unspecified: Secondary | ICD-10-CM | POA: Diagnosis not present

## 2017-06-10 LAB — BASIC METABOLIC PANEL
BUN: 19 mg/dL (ref 7–25)
CALCIUM: 9.4 mg/dL (ref 8.6–10.3)
CO2: 28 mmol/L (ref 20–32)
Chloride: 101 mmol/L (ref 98–110)
Creat: 0.95 mg/dL (ref 0.70–1.25)
Glucose, Bld: 156 mg/dL — ABNORMAL HIGH (ref 65–99)
POTASSIUM: 4.3 mmol/L (ref 3.5–5.3)
Sodium: 138 mmol/L (ref 135–146)

## 2017-06-10 LAB — EXTRA LAV TOP TUBE

## 2017-06-17 ENCOUNTER — Other Ambulatory Visit: Payer: Self-pay | Admitting: Family Medicine

## 2017-06-17 DIAGNOSIS — R009 Unspecified abnormalities of heart beat: Secondary | ICD-10-CM

## 2017-06-17 DIAGNOSIS — Z0000 Encounter for general adult medical examination without abnormal findings: Secondary | ICD-10-CM

## 2017-06-17 DIAGNOSIS — Z23 Encounter for immunization: Secondary | ICD-10-CM

## 2017-08-06 ENCOUNTER — Other Ambulatory Visit: Payer: Self-pay | Admitting: *Deleted

## 2017-08-06 NOTE — Patient Outreach (Signed)
[facility]) Care Management  08/06/2017  Charles Bailey May 05, 1945 [id number]   Telephone Screen  Referral Date: 08/06/17 Referral Source: Nurse call center HTA Referral Reason:urination  Insurance:HTA   Outreach attempt # 1 successful at the home number  Patient is able to verify HIPAA Reviewed and addressed referral to [facility] with patient He reports he decided to call his MD and got an appointment to be seen tomorrow,  He increased his fluids on 08/05/17 but confirms decrease in fluids on today Cm encouraged fluid intake and possible UTI or prostate concerns He reports burning continues mostly at the end of urination stream He is appreciative of the call from [place] for follow up  Social: Mr Ciszewski report being independent in his care with support of son and friends he continues to drive   Conditions: atrial fibrillation, HTN, AAA. DM II hyperlipidemia, HTN,  Polyps  Medications: He has 11 medicines and is in donut hole/coverage gap Having concerns with cost of Xarelto He reports having at least a 6+ month supply but is concern with cost increase  Cm discussed goodrx.com and needymeds.org and getting patient assistance   Appointments:Has an appointment with his MD  Dr Dennard Schaumann on 08/07/17 to f/u on urination issues at 11 am Reports being seen every 3-6 months   Advance Directives: He denies need for assist with or assist with changes for advance directives   Consent: [facility] RN CM reviewed [facility] services with patient. Patient gave verbal consent for services to [place] for coverage gap concerns with xarelto  Plan: [facility] RN CM will refer Mr Pickar to pharmacy for coverage gap concerns with [place]. Lavina Hamman, RN, BSN, CCM [facility] Telephonic Care Management Care Coordinator Direct number [phone]  [facility] number [phone] Fax number [phone]

## 2017-08-07 ENCOUNTER — Ambulatory Visit: Payer: PPO | Admitting: Family Medicine

## 2017-08-07 ENCOUNTER — Encounter: Payer: Self-pay | Admitting: Family Medicine

## 2017-08-07 ENCOUNTER — Ambulatory Visit (INDEPENDENT_AMBULATORY_CARE_PROVIDER_SITE_OTHER): Payer: PPO | Admitting: Family Medicine

## 2017-08-07 VITALS — BP 140/80 | HR 60 | Temp 98.2°F | Resp 18 | Ht 68.0 in | Wt 197.0 lb

## 2017-08-07 DIAGNOSIS — R82998 Other abnormal findings in urine: Secondary | ICD-10-CM | POA: Diagnosis not present

## 2017-08-07 DIAGNOSIS — R300 Dysuria: Secondary | ICD-10-CM

## 2017-08-07 LAB — URINALYSIS, ROUTINE W REFLEX MICROSCOPIC
Bilirubin Urine: NEGATIVE
GLUCOSE, UA: NEGATIVE
KETONES UR: NEGATIVE
NITRITE: POSITIVE — AB
PH: 6 (ref 5.0–8.0)
SPECIFIC GRAVITY, URINE: 1.025 (ref 1.001–1.03)

## 2017-08-07 LAB — MICROSCOPIC MESSAGE

## 2017-08-07 MED ORDER — SULFAMETHOXAZOLE-TRIMETHOPRIM 800-160 MG PO TABS: 1.0000 | ORAL_TABLET | Freq: Two times a day (BID) | ORAL | 0 refills | Status: DC

## 2017-08-07 NOTE — Addendum Note (Signed)
Addended by: Shary Decamp B on: 08/07/2017 02:44 PM   Modules accepted: Orders

## 2017-08-07 NOTE — Progress Notes (Signed)
Subjective:    Patient ID: Charles Bailey, male    DOB: Apr 26, 1945, 72 y.o.   MRN: [id number]  Diabetes     Patient reports a 3-day history of dysuria, urgency, frequency, urinary hesitancy, and foul-smelling urine.  He also reports tenderness and discomfort in his lower back.   Past Medical History:  Diagnosis Date  . Adenomatous colon polyp 2003  . Diabetes mellitus   . Hyperlipidemia   . Hypertension    Past Surgical History:  Procedure Laterality Date  . ABDOMINAL AORTIC ANEURYSM REPAIR  2007  . CARDIOVERSION N/A 03/29/2014   Procedure: CARDIOVERSION;  Surgeon: Josue Hector, MD;  Location: [facility] ENDOSCOPY;  Service: Cardiovascular;  Laterality: N/A;  . [place]   right   Current Outpatient Medications on File Prior to Visit  Medication Sig Dispense Refill  . amLODipine (NORVASC) 10 MG tablet Take 1 tablet by mouth once daily (need to be seen before any further refills) 90 tablet 2  . Cholecalciferol (VITAMIN D3) 1000 UNITS CAPS Take 2,000 Units by mouth daily.     . cloNIDine (CATAPRES) 0.2 MG tablet Take 1 tablet by mouth twice a day 180 tablet 2  . fish oil-omega-3 fatty acids 1000 MG capsule Take 1,200 mg by mouth daily.    . furosemide (LASIX) 40 MG tablet TAKE 1 TABLET BY MOUTH EVERY DAY (Patient taking differently: 1 tab po prn swelling) 90 tablet 3  . KLOR-CON M20 20 MEQ tablet TAKE 1 TABLET BY MOUTH EVERY DAY (Patient taking differently: 1 tab po prn furosemide) 90 tablet 3  . lisinopril (PRINIVIL,ZESTRIL) 40 MG tablet Take 1 tablet by mouth every day 90 tablet 2  . metFORMIN (GLUCOPHAGE) 1000 MG tablet Take 1 tablet by mouth twice a day with meals 180 tablet 2  . metoprolol tartrate (LOPRESSOR) 50 MG tablet Take 1 tablet (50 mg total) by mouth 2 (two) times daily. 180 tablet 3  . pravastatin (PRAVACHOL) 40 MG tablet Take 1 tablet by mouth daily 90 tablet 2  . rivaroxaban (XARELTO) 20 MG TABS tablet Take 1 tablet (20 mg total) by mouth daily with  supper. 90 tablet 3   No current facility-administered medications on file prior to visit.    No Known Allergies Social History   Socioeconomic History  . Marital status: Married    Spouse name: Not on file  . Number of children: Not on file  . Years of education: Not on file  . Highest education level: Not on file  Occupational History  . Not on file  Social Needs  . Financial resource strain: Not on file  . Food insecurity:    Worry: Not on file    Inability: Not on file  . Transportation needs:    Medical: Not on file    Non-medical: Not on file  Tobacco Use  . Smoking status: Former Smoker    Years: 15.00    Types: Cigarettes    Last attempt to quit: 03/03/2005    Years since quitting: 12.4  . Smokeless tobacco: Never Used  Substance and Sexual Activity  . Alcohol use: Yes    Alcohol/week: 2.4 oz    Types: 4 Cans of beer per week  . Drug use: No  . Sexual activity: Not on file  Lifestyle  . Physical activity:    Days per week: Not on file    Minutes per session: Not on file  . Stress: Not on file  Relationships  .  Social connections:    Talks on phone: Not on file    Gets together: Not on file    Attends religious service: Not on file    Active member of club or organization: Not on file    Attends meetings of clubs or organizations: Not on file    Relationship status: Not on file  . Intimate partner violence:    Fear of current or ex partner: Not on file    Emotionally abused: Not on file    Physically abused: Not on file    Forced sexual activity: Not on file  Other Topics Concern  . Not on file  Social History Narrative  . Not on file   Family History  Problem Relation Age of Onset  . Heart disease Father   . Heart failure Mother        died in sleep at 50 years   . Diabetes Paternal Aunt   . Heart disease Paternal Uncle   . Heart disease Paternal Grandfather       Review of Systems  All other systems reviewed and are negative.        Objective:   Physical Exam  Constitutional: He appears well-developed and well-nourished. No distress.  Eyes: Pupils are equal, round, and reactive to light. Conjunctivae and EOM are normal. Right eye exhibits no discharge. Left eye exhibits no discharge. No scleral icterus.  Neck: Normal range of motion. Neck supple. No JVD present. No tracheal deviation present.  Cardiovascular: Normal rate and normal heart sounds. An irregularly irregular rhythm present. Exam reveals no gallop and no friction rub.  No murmur heard. Pulmonary/Chest: Effort normal and breath sounds normal. No stridor. No respiratory distress. He has no wheezes. He has no rales. He exhibits no tenderness.  Abdominal: Soft. Bowel sounds are normal.  Musculoskeletal: Normal range of motion. He exhibits no edema.  Skin: He is not diaphoretic.  Vitals reviewed.         Assessment & Plan:  Dark urine - Plan: Urinalysis, Routine w reflex microscopic  Dysuria - Plan: Urinalysis, Routine w reflex microscopic Based on the presence of leukocyte esterase, blood, and nitrites in the urine and his symptoms, I suspect the patient has a urinary tract infection.  Most likely this originated with prostatitis.  Therefore I will treat the patient with Bactrim double strength tablets 1 p.o. twice daily for 10 days.  I will send a urine culture.  If symptoms are persisting, we may need to extend treatment.

## 2017-08-09 LAB — URINE CULTURE
MICRO NUMBER:: 90881653
SPECIMEN QUALITY:: ADEQUATE

## 2017-08-11 ENCOUNTER — Other Ambulatory Visit: Payer: Self-pay

## 2017-08-11 NOTE — Patient Outreach (Signed)
  [facility]) Care Management  08/11/2017  EKANSH SHERK 1945/06/04 [id number]  72 year old male referred [place] services for medication assistance for Xarelto.   PMHx includes, but not limited to, hypertension, atrial fibrillation and  Type 2 diabetes mellitus.  Successful outreach to Mr. Neis.  HIPAA identifiers verified.   Medication Assistance: Per financial discussion, Mr. Klus does not qualify for Extra Help LIS.  Mr. Leser reports that he receives social security and a pension.  He would need to spend ~ $1750 to be eligible for Xarelto patient assistance and per HTA his OOP is $486.57.   He reports that all his other prescriptions are affordable generics.  Informed Mr. Ingwersen that I will close his [place] case at this time.  He is aware that he can call me back if he reaches the OOP requirement to apply for Xarelto.  Plan: Route case closure letter to PCP, Dr. Dennard Schaumann.  Joetta Manners, PharmD Clinical Pharmacist [place] [phone]

## 2017-08-19 ENCOUNTER — Other Ambulatory Visit: Payer: Self-pay | Admitting: Family Medicine

## 2017-08-19 DIAGNOSIS — Z0000 Encounter for general adult medical examination without abnormal findings: Secondary | ICD-10-CM

## 2017-08-19 DIAGNOSIS — R009 Unspecified abnormalities of heart beat: Secondary | ICD-10-CM

## 2017-08-19 DIAGNOSIS — Z23 Encounter for immunization: Secondary | ICD-10-CM

## 2017-08-26 ENCOUNTER — Other Ambulatory Visit: Payer: Self-pay

## 2017-08-26 NOTE — Patient Outreach (Signed)
[facility]) Care Management  08/26/2017  Charles Bailey 1945/03/05 [id number]  Mr. Rod does not qualify for patient assistance at this time.   Plan: Close [place] case  Closure notice routed to PCP.  Joetta Manners, PharmD Clinical Pharmacist [place] [phone]

## 2017-08-28 ENCOUNTER — Other Ambulatory Visit: Payer: Self-pay | Admitting: Family Medicine

## 2017-08-28 DIAGNOSIS — I480 Paroxysmal atrial fibrillation: Secondary | ICD-10-CM

## 2017-10-02 ENCOUNTER — Ambulatory Visit (INDEPENDENT_AMBULATORY_CARE_PROVIDER_SITE_OTHER): Payer: PPO | Admitting: Family Medicine

## 2017-10-02 ENCOUNTER — Other Ambulatory Visit: Payer: Self-pay

## 2017-10-02 ENCOUNTER — Encounter: Payer: Self-pay | Admitting: Family Medicine

## 2017-10-02 VITALS — BP 142/80 | HR 60 | Temp 98.2°F | Resp 16 | Ht 68.0 in | Wt 200.0 lb

## 2017-10-02 DIAGNOSIS — R319 Hematuria, unspecified: Secondary | ICD-10-CM

## 2017-10-02 DIAGNOSIS — R300 Dysuria: Secondary | ICD-10-CM | POA: Diagnosis not present

## 2017-10-02 LAB — URINALYSIS, ROUTINE W REFLEX MICROSCOPIC
Bacteria, UA: NONE SEEN /HPF
Bilirubin Urine: NEGATIVE
KETONES UR: NEGATIVE
NITRITE: NEGATIVE
PH: 6 (ref 5.0–8.0)
SQUAMOUS EPITHELIAL / LPF: NONE SEEN /HPF (ref <=5)
Specific Gravity, Urine: 1.015 (ref 1.001–1.03)

## 2017-10-02 LAB — MICROSCOPIC MESSAGE

## 2017-10-02 MED ORDER — SULFAMETHOXAZOLE-TRIMETHOPRIM 800-160 MG PO TABS: 1.0000 | ORAL_TABLET | Freq: Two times a day (BID) | ORAL | 0 refills | Status: AC

## 2017-10-02 NOTE — Progress Notes (Signed)
Patient ID: Charles Bailey, male    DOB: 1945-02-22, 72 y.o.   MRN: [id number]  PCP: Susy Frizzle, MD  Chief Complaint  Patient presents with  . Dysuria    x4 days- urgency, burning, hematuria, frequency     Subjective:   Charles Bailey is a 72 y.o. male, presents to clinic with CC of dysuria x 4 days, mild with some urinary frequency.  Earlier this year had prostatitis, this is very mild compared to that.   Dysuria   This is a new problem. The current episode started in the past 7 days. The problem occurs intermittently. The problem has been gradually improving. The quality of the pain is described as burning. The pain is moderate. There has been no fever. He is sexually active. There is no history of pyelonephritis. Associated symptoms include frequency, hematuria (one time with few drops of blood) and urgency. Pertinent negatives include no chills, discharge, flank pain, hesitancy, nausea, sweats or vomiting. He has tried nothing for the symptoms. There is no history of recurrent UTIs, urinary stasis or a urological procedure.      Patient Active Problem List   Diagnosis Date Noted  . A-fib ([place]) 01/20/2014  . Dizziness and giddiness 07/23/2013  . HTN (hypertension) 04/17/2010  . Type 2 diabetes mellitus ([place]) 04/17/2010  . Obesity 04/17/2010  . AAA (abdominal aortic aneurysm, ruptured) ([place]) 04/17/2010  . Hernia, ventral 04/17/2010  . Atrophic testicle 04/17/2010  . Diverticulosis 04/17/2010     Prior to Admission medications   Medication Sig Start Date End Date Taking? Authorizing Provider  amLODipine (NORVASC) 10 MG tablet Take 1 tablet by mouth once daily (need to be seen before any further refills) 02/17/17  Yes Pickard, Cammie Mcgee, MD  Cholecalciferol (VITAMIN D3) 1000 UNITS CAPS Take 2,000 Units by mouth daily.    Yes [provider]  cloNIDine (CATAPRES) 0.2 MG tablet Take 1 tablet by mouth twice a day 06/18/17  Yes Susy Frizzle, MD  fish oil-omega-3  fatty acids 1000 MG capsule Take 1,200 mg by mouth daily.   Yes [provider]  furosemide (LASIX) 40 MG tablet TAKE 1 TABLET BY MOUTH EVERY DAY Patient taking differently: 1 tab po prn swelling 06/17/17  Yes Susy Frizzle, MD  KLOR-CON M20 20 MEQ tablet TAKE 1 TABLET BY MOUTH EVERY DAY Patient taking differently: 1 tab po prn furosemide 06/17/17  Yes Susy Frizzle, MD  lisinopril (PRINIVIL,ZESTRIL) 40 MG tablet Take 1 tablet by mouth every day 05/05/17  Yes Susy Frizzle, MD  metFORMIN (GLUCOPHAGE) 1000 MG tablet Take 1 tablet by mouth twice a day with meals 08/19/17  Yes Susy Frizzle, MD  metoprolol tartrate (LOPRESSOR) 50 MG tablet Take 1 tablet by mouth twice a day 08/29/17  Yes Susy Frizzle, MD  pravastatin (PRAVACHOL) 40 MG tablet Take 1 tablet by mouth daily 04/09/17  Yes Susy Frizzle, MD  rivaroxaban (XARELTO) 20 MG TABS tablet Take 1 tablet (20 mg total) by mouth daily with supper. 04/21/17  Yes Susy Frizzle, MD     No Known Allergies   Family History  Problem Relation Age of Onset  . Heart disease Father   . Heart failure Mother        died in sleep at 17 years   . Diabetes Paternal Aunt   . Heart disease Paternal Uncle   . Heart disease Paternal Grandfather      Social History  Socioeconomic History  . Marital status: Married    Spouse name: Not on file  . Number of children: Not on file  . Years of education: Not on file  . Highest education level: Not on file  Occupational History  . Not on file  Social Needs  . Financial resource strain: Not on file  . Food insecurity:    Worry: Not on file    Inability: Not on file  . Transportation needs:    Medical: Not on file    Non-medical: Not on file  Tobacco Use  . Smoking status: Former Smoker    Years: 15.00    Types: Cigarettes    Last attempt to quit: 03/03/2005    Years since quitting: 12.5  . Smokeless tobacco: Never Used  Substance and Sexual Activity  . Alcohol use: Yes     Alcohol/week: 4.0 standard drinks    Types: 4 Cans of beer per week  . Drug use: No  . Sexual activity: Not on file  Lifestyle  . Physical activity:    Days per week: Not on file    Minutes per session: Not on file  . Stress: Not on file  Relationships  . Social connections:    Talks on phone: Not on file    Gets together: Not on file    Attends religious service: Not on file    Active member of club or organization: Not on file    Attends meetings of clubs or organizations: Not on file    Relationship status: Not on file  . Intimate partner violence:    Fear of current or ex partner: Not on file    Emotionally abused: Not on file    Physically abused: Not on file    Forced sexual activity: Not on file  Other Topics Concern  . Not on file  Social History Narrative  . Not on file     Review of Systems  Constitutional: Negative.  Negative for chills.  HENT: Negative.   Eyes: Negative.   Respiratory: Negative.   Cardiovascular: Negative.   Gastrointestinal: Negative.  Negative for nausea and vomiting.  Endocrine: Negative.   Genitourinary: Positive for dysuria, frequency, hematuria (one time with few drops of blood) and urgency. Negative for flank pain and hesitancy.  Musculoskeletal: Negative.   Skin: Negative.   Allergic/Immunologic: Negative.   Neurological: Negative.   Hematological: Negative.   Psychiatric/Behavioral: Negative.   All other systems reviewed and are negative.      Objective:    Vitals:   10/02/17 1411  BP: (!) 142/80  Pulse: 60  Resp: 16  Temp: 98.2 F (36.8 C)  TempSrc: Oral  SpO2: 96%  Weight: 200 lb (90.7 kg)  Height: 5\' 8"  (1.727 m)      Physical Exam  Constitutional: He appears well-developed and well-nourished. No distress.  HENT:  Head: Normocephalic and atraumatic.  Nose: Nose normal.  Mouth/Throat: Oropharynx is clear and moist.  Eyes: Conjunctivae are normal. Right eye exhibits no discharge. Left eye exhibits no  discharge.  Neck: No tracheal deviation present.  Cardiovascular: Normal rate, regular rhythm, normal heart sounds and intact distal pulses. Exam reveals no gallop and no friction rub.  No murmur heard. Pulmonary/Chest: Effort normal. No stridor. No respiratory distress.  Abdominal: Soft. Bowel sounds are normal. He exhibits no distension and no mass. There is no tenderness. There is no rebound and no guarding.  No CVA tenderness  Musculoskeletal: Normal range of motion.  Neurological: He  is alert. He exhibits normal muscle tone. Coordination normal.  Skin: Skin is warm and dry. No rash noted. He is not diaphoretic.  Psychiatric: He has a normal mood and affect. His behavior is normal.  Nursing note and vitals reviewed.     Results for orders placed or performed in visit on 10/02/17  Urinalysis, Routine w reflex microscopic  Result Value Ref Range   Color, Urine YELLOW YELLOW   APPearance CLOUDY (A) CLEAR   Specific Gravity, Urine 1.015 1.001 - 1.03   pH 6.0 5.0 - 8.0   Glucose, UA TRACE (A) NEGATIVE   Bilirubin Urine NEGATIVE NEGATIVE   Ketones, ur NEGATIVE NEGATIVE   Hgb urine dipstick 2+ (A) NEGATIVE   Protein, ur 2+ (A) NEGATIVE   Nitrite NEGATIVE NEGATIVE   Leukocytes, UA 3+ (A) NEGATIVE   WBC, UA 20-40 (A) 0 - 5 /HPF   RBC / HPF 3-10 (A) 0 - 2 /HPF   Squamous Epithelial / LPF NONE SEEN < OR = 5 /HPF   Bacteria, UA NONE SEEN NONE SEEN /HPF  Microscopic Message  Result Value Ref Range   Note         Assessment & Plan:      ICD-10-CM   1. Dysuria R30.0 Urinalysis, Routine w reflex microscopic    Urine Culture  2. Hematuria, unspecified type R31.9 Urinalysis, Routine w reflex microscopic    Urine Culture    Patient is a 72 year old male presents with a week of gradually improving dysuria and only one episode of hematuria.  He was treated for UTI about a month and a half ago by his PCP, Dr. Dennard Schaumann.  He notes some concern for possible recurrent prostatitis.  Patient  states that he felt significantly improved after 1 week of antibiotics and felt well for several weeks, but then developed some discomfort again and would like to be checked.   No abd pain, flank pain, mild urinary sx right now. UA with 3+ leuks Cover with abx and run urine culture.     Delsa Grana, PA-C 10/02/17 2:30 PM

## 2017-10-03 LAB — URINE CULTURE
MICRO NUMBER: 91126388
SPECIMEN QUALITY: ADEQUATE

## 2017-10-06 ENCOUNTER — Telehealth: Payer: Self-pay | Admitting: Family Medicine

## 2017-10-06 NOTE — Telephone Encounter (Signed)
Urine culture results read as follows: Multiple organisms present, each less than 10,000 CFU/mL. These organisms, commonly found on external and internal genitalia, are considered to be colonizers.   Urine noted to be colonized and not a true UTI. Does not need ABTx.   Call placed to patient. Charles Bailey.

## 2017-10-06 NOTE — Telephone Encounter (Signed)
Patient returned call.   States that he did pick up there prescription for the ABTx, but did not start them as he was out of town.   Reports that his Sx have resolved.   Advised to not take ABTx at this time.

## 2017-10-06 NOTE — Telephone Encounter (Signed)
Pt called and lvm states that his burning with urination has gone away without the medication that we had prescribed to him. He wants to know if he should still go ahead and take the medication or if he should just save it in case it starts burning again... Did not leave name of med in vm.

## 2017-10-07 NOTE — Telephone Encounter (Signed)
Yes, no need for Abx.  If sx return he can leave another sample to run UA and urine culture

## 2017-10-15 ENCOUNTER — Telehealth: Payer: Self-pay | Admitting: Family Medicine

## 2017-10-15 NOTE — Telephone Encounter (Signed)
Patient is requesting a call when you receive xaralto samples. The prescription is very expensive.

## 2017-10-17 ENCOUNTER — Other Ambulatory Visit: Payer: Self-pay | Admitting: Family Medicine

## 2017-10-17 MED ORDER — RIVAROXABAN 20 MG PO TABS: 20.0000 mg | ORAL_TABLET | Freq: Every day | ORAL | 3 refills | Status: DC

## 2017-10-20 ENCOUNTER — Other Ambulatory Visit: Payer: Self-pay | Admitting: Family Medicine

## 2017-10-20 MED ORDER — RIVAROXABAN 20 MG PO TABS: 20.0000 mg | ORAL_TABLET | Freq: Every day | ORAL | 3 refills | Status: DC

## 2017-10-21 ENCOUNTER — Other Ambulatory Visit: Payer: Self-pay | Admitting: *Deleted

## 2017-10-21 MED ORDER — RIVAROXABAN 20 MG PO TABS: 20.0000 mg | ORAL_TABLET | Freq: Every day | ORAL | 3 refills | Status: AC

## 2017-10-29 ENCOUNTER — Encounter: Payer: Self-pay | Admitting: *Deleted

## 2017-10-30 ENCOUNTER — Encounter (HOSPITAL_BASED_OUTPATIENT_CLINIC_OR_DEPARTMENT_OTHER): Payer: Self-pay | Admitting: *Deleted

## 2017-10-30 ENCOUNTER — Emergency Department (HOSPITAL_BASED_OUTPATIENT_CLINIC_OR_DEPARTMENT_OTHER): Payer: PPO

## 2017-10-30 ENCOUNTER — Other Ambulatory Visit: Payer: Self-pay

## 2017-10-30 ENCOUNTER — Inpatient Hospital Stay (HOSPITAL_BASED_OUTPATIENT_CLINIC_OR_DEPARTMENT_OTHER)
Admission: EM | Admit: 2017-10-30 | Discharge: 2017-11-14 | DRG: 215 | Disposition: E | Payer: PPO | Attending: Thoracic Surgery (Cardiothoracic Vascular Surgery) | Admitting: Thoracic Surgery (Cardiothoracic Vascular Surgery)

## 2017-10-30 DIAGNOSIS — D62 Acute posthemorrhagic anemia: Secondary | ICD-10-CM | POA: Diagnosis not present

## 2017-10-30 DIAGNOSIS — I237 Postinfarction angina: Secondary | ICD-10-CM | POA: Diagnosis present

## 2017-10-30 DIAGNOSIS — J810 Acute pulmonary edema: Secondary | ICD-10-CM | POA: Diagnosis not present

## 2017-10-30 DIAGNOSIS — D6959 Other secondary thrombocytopenia: Secondary | ICD-10-CM | POA: Diagnosis not present

## 2017-10-30 DIAGNOSIS — Z79899 Other long term (current) drug therapy: Secondary | ICD-10-CM

## 2017-10-30 DIAGNOSIS — E119 Type 2 diabetes mellitus without complications: Secondary | ICD-10-CM

## 2017-10-30 DIAGNOSIS — J984 Other disorders of lung: Secondary | ICD-10-CM | POA: Diagnosis not present

## 2017-10-30 DIAGNOSIS — R6521 Severe sepsis with septic shock: Secondary | ICD-10-CM | POA: Diagnosis not present

## 2017-10-30 DIAGNOSIS — I482 Chronic atrial fibrillation, unspecified: Secondary | ICD-10-CM | POA: Diagnosis not present

## 2017-10-30 DIAGNOSIS — M19011 Primary osteoarthritis, right shoulder: Secondary | ICD-10-CM | POA: Diagnosis not present

## 2017-10-30 DIAGNOSIS — Z833 Family history of diabetes mellitus: Secondary | ICD-10-CM

## 2017-10-30 DIAGNOSIS — I10 Essential (primary) hypertension: Secondary | ICD-10-CM | POA: Diagnosis not present

## 2017-10-30 DIAGNOSIS — R079 Chest pain, unspecified: Secondary | ICD-10-CM | POA: Diagnosis not present

## 2017-10-30 DIAGNOSIS — E663 Overweight: Secondary | ICD-10-CM | POA: Diagnosis present

## 2017-10-30 DIAGNOSIS — I713 Abdominal aortic aneurysm, ruptured, unspecified: Secondary | ICD-10-CM | POA: Diagnosis present

## 2017-10-30 DIAGNOSIS — E1159 Type 2 diabetes mellitus with other circulatory complications: Secondary | ICD-10-CM

## 2017-10-30 DIAGNOSIS — I5031 Acute diastolic (congestive) heart failure: Secondary | ICD-10-CM | POA: Diagnosis not present

## 2017-10-30 DIAGNOSIS — I351 Nonrheumatic aortic (valve) insufficiency: Secondary | ICD-10-CM | POA: Diagnosis present

## 2017-10-30 DIAGNOSIS — I4891 Unspecified atrial fibrillation: Secondary | ICD-10-CM | POA: Diagnosis not present

## 2017-10-30 DIAGNOSIS — E785 Hyperlipidemia, unspecified: Secondary | ICD-10-CM | POA: Diagnosis present

## 2017-10-30 DIAGNOSIS — I97190 Other postprocedural cardiac functional disturbances following cardiac surgery: Secondary | ICD-10-CM | POA: Diagnosis not present

## 2017-10-30 DIAGNOSIS — Y832 Surgical operation with anastomosis, bypass or graft as the cause of abnormal reaction of the patient, or of later complication, without mention of misadventure at the time of the procedure: Secondary | ICD-10-CM | POA: Diagnosis not present

## 2017-10-30 DIAGNOSIS — J9811 Atelectasis: Secondary | ICD-10-CM

## 2017-10-30 DIAGNOSIS — N170 Acute kidney failure with tubular necrosis: Secondary | ICD-10-CM | POA: Diagnosis not present

## 2017-10-30 DIAGNOSIS — I25110 Atherosclerotic heart disease of native coronary artery with unstable angina pectoris: Secondary | ICD-10-CM | POA: Diagnosis present

## 2017-10-30 DIAGNOSIS — J189 Pneumonia, unspecified organism: Secondary | ICD-10-CM | POA: Diagnosis not present

## 2017-10-30 DIAGNOSIS — Z6829 Body mass index (BMI) 29.0-29.9, adult: Secondary | ICD-10-CM

## 2017-10-30 DIAGNOSIS — I249 Acute ischemic heart disease, unspecified: Secondary | ICD-10-CM | POA: Diagnosis not present

## 2017-10-30 DIAGNOSIS — E1151 Type 2 diabetes mellitus with diabetic peripheral angiopathy without gangrene: Secondary | ICD-10-CM | POA: Diagnosis present

## 2017-10-30 DIAGNOSIS — I700 Atherosclerosis of aorta: Secondary | ICD-10-CM | POA: Diagnosis present

## 2017-10-30 DIAGNOSIS — R579 Shock, unspecified: Secondary | ICD-10-CM | POA: Diagnosis not present

## 2017-10-30 DIAGNOSIS — E669 Obesity, unspecified: Secondary | ICD-10-CM | POA: Diagnosis present

## 2017-10-30 DIAGNOSIS — Z86010 Personal history of colonic polyps: Secondary | ICD-10-CM

## 2017-10-30 DIAGNOSIS — I9589 Other hypotension: Secondary | ICD-10-CM | POA: Diagnosis not present

## 2017-10-30 DIAGNOSIS — Z419 Encounter for procedure for purposes other than remedying health state, unspecified: Secondary | ICD-10-CM

## 2017-10-30 DIAGNOSIS — J9601 Acute respiratory failure with hypoxia: Secondary | ICD-10-CM | POA: Diagnosis present

## 2017-10-30 DIAGNOSIS — I252 Old myocardial infarction: Secondary | ICD-10-CM

## 2017-10-30 DIAGNOSIS — Z7901 Long term (current) use of anticoagulants: Secondary | ICD-10-CM

## 2017-10-30 DIAGNOSIS — I2109 ST elevation (STEMI) myocardial infarction involving other coronary artery of anterior wall: Secondary | ICD-10-CM | POA: Diagnosis present

## 2017-10-30 DIAGNOSIS — I5043 Acute on chronic combined systolic (congestive) and diastolic (congestive) heart failure: Secondary | ICD-10-CM | POA: Diagnosis present

## 2017-10-30 DIAGNOSIS — R570 Cardiogenic shock: Secondary | ICD-10-CM | POA: Diagnosis present

## 2017-10-30 DIAGNOSIS — J80 Acute respiratory distress syndrome: Secondary | ICD-10-CM | POA: Diagnosis not present

## 2017-10-30 DIAGNOSIS — I480 Paroxysmal atrial fibrillation: Secondary | ICD-10-CM | POA: Diagnosis not present

## 2017-10-30 DIAGNOSIS — Z7984 Long term (current) use of oral hypoglycemic drugs: Secondary | ICD-10-CM

## 2017-10-30 DIAGNOSIS — Z8679 Personal history of other diseases of the circulatory system: Secondary | ICD-10-CM

## 2017-10-30 DIAGNOSIS — Y95 Nosocomial condition: Secondary | ICD-10-CM | POA: Diagnosis present

## 2017-10-30 DIAGNOSIS — Z87891 Personal history of nicotine dependence: Secondary | ICD-10-CM

## 2017-10-30 DIAGNOSIS — J9602 Acute respiratory failure with hypercapnia: Secondary | ICD-10-CM | POA: Diagnosis not present

## 2017-10-30 DIAGNOSIS — I5041 Acute combined systolic (congestive) and diastolic (congestive) heart failure: Secondary | ICD-10-CM | POA: Diagnosis not present

## 2017-10-30 DIAGNOSIS — I083 Combined rheumatic disorders of mitral, aortic and tricuspid valves: Secondary | ICD-10-CM | POA: Diagnosis not present

## 2017-10-30 DIAGNOSIS — A419 Sepsis, unspecified organism: Secondary | ICD-10-CM | POA: Diagnosis not present

## 2017-10-30 DIAGNOSIS — I4901 Ventricular fibrillation: Secondary | ICD-10-CM | POA: Diagnosis not present

## 2017-10-30 DIAGNOSIS — Z8249 Family history of ischemic heart disease and other diseases of the circulatory system: Secondary | ICD-10-CM

## 2017-10-30 DIAGNOSIS — I110 Hypertensive heart disease with heart failure: Secondary | ICD-10-CM | POA: Diagnosis present

## 2017-10-30 DIAGNOSIS — I462 Cardiac arrest due to underlying cardiac condition: Secondary | ICD-10-CM | POA: Diagnosis not present

## 2017-10-30 DIAGNOSIS — I509 Heart failure, unspecified: Secondary | ICD-10-CM | POA: Diagnosis not present

## 2017-10-30 DIAGNOSIS — I310 Chronic adhesive pericarditis: Secondary | ICD-10-CM | POA: Diagnosis present

## 2017-10-30 DIAGNOSIS — Z4682 Encounter for fitting and adjustment of non-vascular catheter: Secondary | ICD-10-CM | POA: Diagnosis not present

## 2017-10-30 DIAGNOSIS — Z01810 Encounter for preprocedural cardiovascular examination: Secondary | ICD-10-CM

## 2017-10-30 DIAGNOSIS — R918 Other nonspecific abnormal finding of lung field: Secondary | ICD-10-CM | POA: Diagnosis not present

## 2017-10-30 DIAGNOSIS — I2510 Atherosclerotic heart disease of native coronary artery without angina pectoris: Secondary | ICD-10-CM | POA: Diagnosis present

## 2017-10-30 DIAGNOSIS — I469 Cardiac arrest, cause unspecified: Secondary | ICD-10-CM | POA: Diagnosis not present

## 2017-10-30 DIAGNOSIS — R0902 Hypoxemia: Secondary | ICD-10-CM

## 2017-10-30 HISTORY — DX: Heart failure, unspecified: I50.9

## 2017-10-30 HISTORY — DX: Unspecified atrial fibrillation: I48.91

## 2017-10-30 LAB — BASIC METABOLIC PANEL
ANION GAP: 14 (ref 5–15)
BUN: 18 mg/dL (ref 8–23)
CALCIUM: 9.3 mg/dL (ref 8.9–10.3)
CO2: 26 mmol/L (ref 22–32)
Chloride: 95 mmol/L — ABNORMAL LOW (ref 98–111)
Creatinine, Ser: 0.98 mg/dL (ref 0.61–1.24)
GFR calc non Af Amer: >60 mL/min (ref >60)
Glucose, Bld: 249 mg/dL — ABNORMAL HIGH (ref 70–99)
Potassium: 3.9 mmol/L (ref 3.5–5.1)
Sodium: 135 mmol/L (ref 135–145)

## 2017-10-30 LAB — GLUCOSE, CAPILLARY
Glucose-Capillary: 169 mg/dL — ABNORMAL HIGH (ref 70–99)
Glucose-Capillary: 217 mg/dL — ABNORMAL HIGH (ref 70–99)

## 2017-10-30 LAB — CBC WITH DIFFERENTIAL/PLATELET
ABS IMMATURE GRANULOCYTES: 0.05 10*3/uL (ref 0.00–0.07)
BASOS PCT: 1 %
Basophils Absolute: 0.1 10*3/uL (ref 0.0–0.1)
Eosinophils Absolute: 0.2 10*3/uL (ref 0.0–0.5)
Eosinophils Relative: 2 %
HCT: 46.4 % (ref 39.0–52.0)
HEMOGLOBIN: 14.7 g/dL (ref 13.0–17.0)
IMMATURE GRANULOCYTES: 1 %
Lymphocytes Relative: 12 %
Lymphs Abs: 1.3 10*3/uL (ref 0.7–4.0)
MCH: 29.5 pg (ref 26.0–34.0)
MCHC: 31.7 g/dL (ref 30.0–36.0)
MCV: 93.2 fL (ref 80.0–100.0)
MONOS PCT: 10 %
Monocytes Absolute: 1.1 10*3/uL — ABNORMAL HIGH (ref 0.1–1.0)
NEUTROS ABS: 8.1 10*3/uL — AB (ref 1.7–7.7)
NEUTROS PCT: 74 %
PLATELETS: 234 10*3/uL (ref 150–400)
RBC: 4.98 MIL/uL (ref 4.22–5.81)
RDW: 13.7 % (ref 11.5–15.5)
WBC: 10.8 10*3/uL — ABNORMAL HIGH (ref 4.0–10.5)
nRBC: 0 % (ref 0.0–0.2)

## 2017-10-30 LAB — MRSA PCR SCREENING: MRSA BY PCR: NEGATIVE

## 2017-10-30 LAB — BRAIN NATRIURETIC PEPTIDE: B Natriuretic Peptide: 729.7 pg/mL — ABNORMAL HIGH (ref 0.0–100.0)

## 2017-10-30 LAB — TROPONIN I: Troponin I: <0.03 ng/mL (ref <0.03)

## 2017-10-30 MED ORDER — LISINOPRIL 20 MG PO TABS
40.0000 mg | ORAL_TABLET | Freq: Every day | ORAL | Status: DC
  Administered 2017-10-31 – 2017-11-01 (×2): 40 mg via ORAL
  Filled 2017-10-30 (×2): qty 2

## 2017-10-30 MED ORDER — METOPROLOL TARTRATE 50 MG PO TABS
50.0000 mg | ORAL_TABLET | Freq: Two times a day (BID) | ORAL | Status: DC
Start: 2017-10-30 — End: 2017-11-01
  Administered 2017-10-31 (×3): 50 mg via ORAL
  Filled 2017-10-30 (×4): qty 1

## 2017-10-30 MED ORDER — INSULIN ASPART 100 UNIT/ML ~~LOC~~ SOLN
0.0000 [IU] | Freq: Three times a day (TID) | SUBCUTANEOUS | Status: DC
  Administered 2017-10-31: 2 [IU] via SUBCUTANEOUS
  Administered 2017-10-31: 1 [IU] via SUBCUTANEOUS
  Administered 2017-10-31 – 2017-11-02 (×5): 2 [IU] via SUBCUTANEOUS

## 2017-10-30 MED ORDER — FUROSEMIDE 10 MG/ML IJ SOLN
80.0000 mg | Freq: Once | INTRAMUSCULAR | Status: AC
  Administered 2017-10-30: 80 mg via INTRAVENOUS
  Filled 2017-10-30: qty 8

## 2017-10-30 MED ORDER — NITROGLYCERIN 0.4 MG SL SUBL
SUBLINGUAL_TABLET | SUBLINGUAL | Status: AC
  Filled 2017-10-30: qty 1

## 2017-10-30 MED ORDER — CLONIDINE HCL 0.2 MG PO TABS
0.2000 mg | ORAL_TABLET | Freq: Two times a day (BID) | ORAL | Status: DC
  Administered 2017-10-31 – 2017-11-01 (×4): 0.2 mg via ORAL
  Filled 2017-10-30 (×5): qty 1

## 2017-10-30 MED ORDER — ACETAMINOPHEN 325 MG PO TABS: 650.0000 mg | ORAL_TABLET | Freq: Four times a day (QID) | ORAL | Status: DC | PRN

## 2017-10-30 MED ORDER — ONDANSETRON HCL 4 MG PO TABS: 4.0000 mg | ORAL_TABLET | Freq: Four times a day (QID) | ORAL | Status: DC | PRN

## 2017-10-30 MED ORDER — OMEGA-3-ACID ETHYL ESTERS 1 G PO CAPS
1000.0000 mg | ORAL_CAPSULE | Freq: Every day | ORAL | Status: DC
  Administered 2017-10-31 – 2017-11-01 (×2): 1000 mg via ORAL
  Filled 2017-10-30 (×2): qty 1

## 2017-10-30 MED ORDER — ONDANSETRON HCL 4 MG/2ML IJ SOLN: 4.0000 mg | Freq: Four times a day (QID) | INTRAMUSCULAR | Status: DC | PRN

## 2017-10-30 MED ORDER — AMLODIPINE BESYLATE 10 MG PO TABS
10.0000 mg | ORAL_TABLET | Freq: Every day | ORAL | Status: DC
  Administered 2017-10-31 – 2017-11-01 (×2): 10 mg via ORAL
  Filled 2017-10-30 (×2): qty 1

## 2017-10-30 MED ORDER — PRAVASTATIN SODIUM 40 MG PO TABS: 40.0000 mg | ORAL_TABLET | Freq: Every day | ORAL | Status: DC

## 2017-10-30 MED ORDER — ACETAMINOPHEN 650 MG RE SUPP: 650.0000 mg | Freq: Four times a day (QID) | RECTAL | Status: DC | PRN

## 2017-10-30 MED ORDER — NITROGLYCERIN 0.4 MG SL SUBL
0.4000 mg | SUBLINGUAL_TABLET | SUBLINGUAL | Status: DC | PRN
  Administered 2017-10-31 (×3): 0.4 mg via SUBLINGUAL

## 2017-10-30 MED ORDER — RIVAROXABAN 20 MG PO TABS: 20.0000 mg | ORAL_TABLET | Freq: Every day | ORAL | Status: DC

## 2017-10-30 MED ORDER — FUROSEMIDE 10 MG/ML IJ SOLN: 60.0000 mg | Freq: Two times a day (BID) | INTRAMUSCULAR | Status: DC

## 2017-10-30 MED ORDER — POTASSIUM CHLORIDE CRYS ER 20 MEQ PO TBCR: 20.0000 meq | EXTENDED_RELEASE_TABLET | Freq: Every day | ORAL | Status: DC

## 2017-10-30 NOTE — ED Notes (Signed)
RT at bedside for BiPap

## 2017-10-30 NOTE — Progress Notes (Signed)
Charles Bailey, [place] Male, 72 y.o., 1945-05-11  [name] is 72yr old male with h/o htn, noninsulin dependent dm2, chronic afib on xarelto, diastolic chf presented [facility] ED due to respiratory distress.  ED course: per EDP patient presented with respiratory distress, dry cough, acute hypoxic in the 77% to 80's% on room air,  no fever, In afib, rate controlled.  he was put on bipap, cxr no acute findings, labs wbc 10.8, glucose 249, bnp 729, troponin less than 0.03, he has edema bilateral lower extremity up to knee per EDP, he is given iv lasix, request hospitalization for acute hypoxic respiratory failure, likely from chf exacerbation.  Patient did report sick contact, prior h/o smoking (quit in 2007), there is no wheezing per EDP  Patient accepted to stepdown/observation

## 2017-10-30 NOTE — ED Notes (Signed)
Portable CXR done.

## 2017-10-30 NOTE — ED Triage Notes (Signed)
Cough x 2 days. Sob.

## 2017-10-30 NOTE — Telephone Encounter (Signed)
We do not have any at the moment - will call when we do.

## 2017-10-30 NOTE — ED Notes (Signed)
Pt on monitor 

## 2017-10-30 NOTE — Progress Notes (Signed)
Placed patient on 6L [place] patient tolerating well. Vital signs stable.  RT will continue to monitor.

## 2017-10-30 NOTE — ED Provider Notes (Addendum)
Emergency Department Provider Note   I have reviewed the triage vital signs and the nursing notes.   HISTORY  Chief Complaint Cough and Shortness of Breath   HPI [name] is a 72 y.o. male with multiple medical problems documented below the presents to the emergency department today secondary to dyspnea.  Patient states that he thought he had a viral infection for the last 2 to 3 days secondary to worsening cough and shortness of breath and a friend that had a viral infection.  He also notes his legs are little bit more swollen than previously.  He has had a nonproductive cough and no fever.  No significant weight gain.  No urinary changes. No other associated or modifying symptoms.    Past Medical History:  Diagnosis Date  . A-fib ([place])   . Adenomatous colon polyp 2003  . CHF (congestive heart failure) ([place])   . Diabetes mellitus   . Hyperlipidemia   . Hypertension     Patient Active Problem List   Diagnosis Date Noted  . A-fib ([place]) 01/20/2014  . Dizziness and giddiness 07/23/2013  . HTN (hypertension) 04/17/2010  . Type 2 diabetes mellitus ([place]) 04/17/2010  . Obesity 04/17/2010  . AAA (abdominal aortic aneurysm, ruptured) ([place]) 04/17/2010  . Hernia, ventral 04/17/2010  . Atrophic testicle 04/17/2010  . Diverticulosis 04/17/2010    Past Surgical History:  Procedure Laterality Date  . ABDOMINAL AORTIC ANEURYSM REPAIR  2007  . CARDIOVERSION N/A 03/29/2014   Procedure: CARDIOVERSION;  Surgeon: Josue Hector, MD;  Location: [place];  Service: Cardiovascular;  Laterality: N/A;  . [place]   right    Current Outpatient Rx  . Order #: [id number] Class: Normal  . Order #: [id number] Class: Historical Med  . Order #: [id number] Class: Normal  . Order #: [id number] Class: Historical Med  . Order #: [id number] Class: Normal  . Order #: [id number] Class: Normal  . Order #: [id number] Class: Normal  . Order #: [id number] Class: Normal  . Order #:  [id number] Class: Normal  . Order #: [id number] Class: Normal  . Order #: [id number] Class: Normal    Allergies Patient has no known allergies.  Family History  Problem Relation Age of Onset  . Heart disease Father   . Heart failure Mother        died in sleep at 32 years   . Diabetes Paternal Aunt   . Heart disease Paternal Uncle   . Heart disease Paternal Grandfather     Social History Social History   Tobacco Use  . Smoking status: Former Smoker    Years: 15.00    Types: Cigarettes    Last attempt to quit: 03/03/2005    Years since quitting: 12.6  . Smokeless tobacco: Never Used  Substance Use Topics  . Alcohol use: Yes    Alcohol/week: 4.0 standard drinks    Types: 4 Cans of beer per week  . Drug use: No    Review of Systems  All other systems negative except as documented in the HPI. All pertinent positives and negatives as reviewed in the HPI. ____________________________________________   PHYSICAL EXAM:  VITAL SIGNS: ED Triage Vitals  Enc Vitals Group     BP 10/31/2017 1130 (!) 181/101     Pulse Rate 11/12/2017 1130 79     Resp 10/20/2017 1130 (!) 24     Temp 11/01/2017 1130 98.9 F (37.2 C)     Temp Source 11/06/2017 1130 Oral     SpO2 10/26/2017 1130 (!) 80 %  Weight 10/28/2017 1128 200 lb (90.7 kg)     Height 10/22/2017 1128 5\' 8"  (1.727 m)    Constitutional: Alert and oriented. Ill appearing and in moderate respiratory distress. Eyes: Conjunctivae are normal. PERRL. EOMI. Head: Atraumatic. Nose: No congestion/rhinnorhea. Mouth/Throat: Mucous membranes are moist.  Oropharynx non-erythematous. Neck: No stridor.  No meningeal signs.   Cardiovascular: Normal rate, regular rhythm. Good peripheral circulation. Grossly normal heart sounds.   Respiratory: tachypneic. Mod resp distress. Pursed lip breathing. Upright posture. Significantly diminished bs bilaterally with end expiratory wheezing. Gastrointestinal: Soft and nontender. No distention.  Musculoskeletal: No  lower extremity tenderness. Has bilateral LE pitting edema. No gross deformities of extremities. Neurologic:  Normal speech and language. No gross focal neurologic deficits are appreciated.  Skin:  Skin is warm, dry and intact. No rash noted.   ____________________________________________   LABS (all labs ordered are listed, but only abnormal results are displayed)  Labs Reviewed  CBC WITH DIFFERENTIAL/PLATELET - Abnormal; Notable for the following components:      Result Value   WBC 10.8 (*)    Neutro Abs 8.1 (*)    Monocytes Absolute 1.1 (*)    All other components within normal limits  BASIC METABOLIC PANEL - Abnormal; Notable for the following components:   Chloride 95 (*)    Glucose, Bld 249 (*)    All other components within normal limits  BRAIN NATRIURETIC PEPTIDE - Abnormal; Notable for the following components:   B Natriuretic Peptide 729.7 (*)    All other components within normal limits  TROPONIN I   ____________________________________________  EKG   EKG Interpretation  Date/Time:  Thursday October 30 2017 11:39:12 EDT Ventricular Rate:  74 PR Interval:    QRS Duration: 93 QT Interval:  426 QTC Calculation: 476 R Axis:   151 Text Interpretation:  Atrial fibrillation Right axis deviation Borderline T abnormalities, anterior leads Borderline prolonged QT interval No significant change since last tracing Confirmed by Merrily Pew [phone]) on 10/23/2017 2:47:04 PM       ____________________________________________  RADIOLOGY  Dg Chest Portable 1 View  Result Date: 11/09/2017 CLINICAL DATA:  Shortness of breath and cough for 2 days, history of atrial fibrillation, hypertension, abdominal aortic aneurysm repair, diabetes mellitus EXAM: PORTABLE CHEST 1 VIEW COMPARISON:  Portable exam 1153 hours compared to 01/02/2017 FINDINGS: Enlargement of cardiac silhouette. Mediastinal contours and pulmonary vascularity normal. Calcified tortuous thoracic aorta. Mild  scarring RIGHT base. No acute infiltrate, pleural effusion, or pneumothorax. Questionable new lytic lesion at the RIGHT humeral head versus artifact. IMPRESSION: RIGHT basilar scarring. No acute infiltrate period. Questionable lytic lesion at the RIGHT humeral head versus artifact; dedicated RIGHT shoulder radiographs recommended to assess. Electronically Signed   By: Lavonia Dana M.D.   On: 11/06/2017 12:15   Dg Shoulder Right Portable  Result Date: 11/07/2017 CLINICAL DATA:  Assess right shoulder for lytic lesion. EXAM: PORTABLE RIGHT SHOULDER COMPARISON:  None. FINDINGS: No discrete lytic lesion identified. AC joint degenerative changes noted. Degenerative changes. Calcific tendinosis at the rotator cuff tendon. No other acute abnormalities. IMPRESSION: No discrete lytic lesions seen in the right humeral head. Degenerative changes. Electronically Signed   By: Dorise Bullion III M.D   On: 11/03/2017 13:46    ____________________________________________   PROCEDURES  Procedure(s) performed:   Procedures  CRITICAL CARE Performed by: Merrily Pew Total critical care time: 45 minutes Critical care time was exclusive of separately billable procedures and treating other patients. Critical care was necessary to treat or prevent imminent  or life-threatening deterioration. Critical care was time spent personally by me on the following activities: development of treatment plan with patient and/or surrogate as well as nursing, discussions with consultants, evaluation of patient's response to treatment, examination of patient, obtaining history from patient or surrogate, ordering and performing treatments and interventions, ordering and review of laboratory studies, ordering and review of radiographic studies, pulse oximetry and re-evaluation of patient's condition.  ____________________________________________   INITIAL IMPRESSION / ASSESSMENT AND PLAN / ED COURSE  Here moderate respiratory  distress initially started on BiPAP secondary to concern for possible CHF exacerbation versus COPD exacerbation.  Is improved his breathing.  Significantly he is much more comfortable with this.  Was also significantly hypoxic.  EKG without any obvious ST changes and negative troponin.  BNP up above 700 consistent with likely fluid overload.  No obvious infectious causes for symptoms so given 80 of IV Lasix.  Only about 300 cc output.   Persistently tachypneic, continuing bipap for now until diuresis. Will admit to medicine for same.    Pertinent labs & imaging results that were available during my care of the patient were reviewed by me and considered in my medical decision making (see chart for details).  ____________________________________________  FINAL CLINICAL IMPRESSION(S) / ED DIAGNOSES  Final diagnoses:  Acute respiratory failure with hypoxia ([place])  Acute pulmonary edema ([facility])     MEDICATIONS GIVEN DURING THIS VISIT:  Medications  amLODipine (NORVASC) tablet 10 mg ( Oral MAR Unhold 10/21/2017 0234)  cloNIDine (CATAPRES) tablet 0.2 mg ( Oral MAR Unhold 10/22/2017 0234)  lisinopril (PRINIVIL,ZESTRIL) tablet 40 mg ( Oral MAR Unhold 11/11/2017 0234)  metoprolol tartrate (LOPRESSOR) tablet 50 mg ( Oral MAR Unhold 11/11/2017 0234)  omega-3 acid ethyl esters (LOVAZA) capsule 1,000 mg ( Oral MAR Unhold 11/08/2017 0234)  ondansetron (ZOFRAN) tablet 4 mg ( Oral MAR Unhold 10/23/2017 0234)    Or  ondansetron (ZOFRAN) injection 4 mg ( Intravenous MAR Unhold 11/06/2017 0234)  insulin aspart (novoLOG) injection 0-9 Units (2 Units Subcutaneous Given 10/21/2017 0758)  nitroGLYCERIN (NITROSTAT) 0.4 MG SL tablet (has no administration in time range)  nitroGLYCERIN 50 mg in dextrose 5 % 250 mL (0.2 mg/mL) infusion (50 mcg/min Intravenous Rate/Dose Verify 10/23/2017 0600)  acetaminophen (TYLENOL) tablet 650 mg (has no administration in time range)  ondansetron (ZOFRAN) injection 4 mg (has no administration in  time range)  sodium chloride flush (NS) 0.9 % injection 3 mL (3 mLs Intravenous Not Given 11/07/2017 0701)  sodium chloride flush (NS) 0.9 % injection 3 mL (has no administration in time range)  0.9 %  sodium chloride infusion (has no administration in time range)  diazepam (VALIUM) tablet 5 mg (has no administration in time range)  atorvastatin (LIPITOR) tablet 80 mg (has no administration in time range)  aspirin chewable tablet 81 mg (has no administration in time range)  heparin ADULT infusion 100 units/mL (25000 units/284mL sodium chloride 0.45%) (1,250 Units/hr Intravenous Rate/Dose Verify 10/23/2017 0800)  furosemide (LASIX) injection 80 mg (80 mg Intravenous Given 11/09/2017 1321)  aspirin chewable tablet 81 mg (81 mg Oral Given 10/26/2017 0012)  atorvastatin (LIPITOR) tablet 80 mg (80 mg Oral Given 10/18/2017 0027)  aspirin chewable tablet 243 mg (243 mg Oral Given 10/15/2017 0026)  furosemide (LASIX) injection 40 mg (40 mg Intravenous Given 10/25/2017 0847)     NEW OUTPATIENT MEDICATIONS STARTED DURING THIS VISIT:  Current Discharge Medication List      Note:  This note was prepared with assistance of Dragon voice recognition  software. Occasional wrong-word or sound-a-like substitutions may have occurred due to the inherent limitations of voice recognition software.   Merrily Pew, MD 10/25/2017 7530    Merrily Pew, MD 10/24/2017 0900

## 2017-10-30 NOTE — ED Notes (Signed)
[facility] here to transport patient. 

## 2017-10-30 NOTE — H&P (Signed)
History and Physical    Charles Bailey KZL:[id number] DOB: [birth year] DOA: 10/15/2017  PCP: Susy Frizzle, MD  Patient coming from: Home.  Chief Complaint: Shortness of breath.  HPI: Charles Bailey is a 72 y.o. male with history of hypertension, atrial fibrillation, and hyperlipidemia, abdominal aortic aneurysm presented to the ER admits in [facility] with complaints of shortness of breath.  Patient has been having worsening shortness of breath over the last 2 days with increasing lower extremity edema.  Denies any chest pain.  Has been having nonproductive cough.  Denies nausea vomiting abdominal pain or diarrhea.  ED Course: In the ER patient was found to be hypoxic and had to be placed on BiPAP.  Chest x-ray shows nothing acute BNP was 729.  EKG was showing A. fib rate controlled.  She was given 80 mg IV Lasix and admitted for acute CHF.  On exam patient is not in distress patient was weaned off the BiPAP.  Review of Systems: As per HPI, rest all negative.   Past Medical History:  Diagnosis Date  . A-fib ([place])   . Adenomatous colon polyp 2003  . CHF (congestive heart failure) ([place])   . Diabetes mellitus   . Hyperlipidemia   . Hypertension     Past Surgical History:  Procedure Laterality Date  . ABDOMINAL AORTIC ANEURYSM REPAIR  2007  . CARDIOVERSION N/A 03/29/2014   Procedure: CARDIOVERSION;  Surgeon: Josue Hector, MD;  Location: [place];  Service: Cardiovascular;  Laterality: N/A;  . INGUINAL HERNIA REPAIR Right 1968     reports that he quit smoking about 12 years ago. His smoking use included cigarettes. He quit after 15.00 years of use. He has never used smokeless tobacco. He reports that he drinks about 4.0 standard drinks of alcohol per week. He reports that he does not use drugs.  No Known Allergies  Family History  Problem Relation Age of Onset  . Heart disease Father   . Heart failure Mother        died in sleep at 36 years   . Diabetes Paternal Aunt    . Heart disease Paternal Uncle   . Heart disease Paternal Grandfather     Prior to Admission medications   Medication Sig Start Date End Date Taking? Authorizing Provider  amLODipine (NORVASC) 10 MG tablet Take 1 tablet by mouth once daily (need to be seen before any further refills) 02/17/17   Susy Frizzle, MD  Cholecalciferol (VITAMIN D3) 1000 UNITS CAPS Take 2,000 Units by mouth daily.     [provider]  cloNIDine (CATAPRES) 0.2 MG tablet Take 1 tablet by mouth twice a day 06/18/17   Susy Frizzle, MD  fish oil-omega-3 fatty acids 1000 MG capsule Take 1,200 mg by mouth daily.    [provider]  furosemide (LASIX) 40 MG tablet TAKE 1 TABLET BY MOUTH EVERY DAY Patient taking differently: 1 tab po prn swelling 06/17/17   Susy Frizzle, MD  KLOR-CON M20 20 MEQ tablet TAKE 1 TABLET BY MOUTH EVERY DAY Patient taking differently: 1 tab po prn furosemide 06/17/17   Susy Frizzle, MD  lisinopril (PRINIVIL,ZESTRIL) 40 MG tablet Take 1 tablet by mouth every day 05/05/17   Susy Frizzle, MD  metFORMIN (GLUCOPHAGE) 1000 MG tablet Take 1 tablet by mouth twice a day with meals 08/19/17   Susy Frizzle, MD  metoprolol tartrate (LOPRESSOR) 50 MG tablet Take 1 tablet by mouth twice a day  08/29/17   Susy Frizzle, MD  pravastatin (PRAVACHOL) 40 MG tablet Take 1 tablet by mouth daily 04/09/17   Susy Frizzle, MD  rivaroxaban (XARELTO) 20 MG TABS tablet Take 1 tablet (20 mg total) by mouth daily with supper. 10/21/17   Susy Frizzle, MD    Physical Exam: Vitals:   11/01/2017 1615 11/06/2017 1742 10/29/2017 1800 11/12/2017 2000  BP: 114/89  (!) 169/83 (!) 167/87  Pulse: (!) 52 60 62 63  Resp: 16 (!) 31 20 (!) 21  Temp: (!) 97.5 F (36.4 C)   97.7 F (36.5 C)  TempSrc: Axillary   Axillary  SpO2: 96% 97% 96% 96%  Weight:   88.3 kg   Height:   5\' 8"  (1.727 m)       Constitutional: Moderately built and nourished. Vitals:   11/09/2017 1615 10/25/2017 1742 10/29/2017  1800 10/27/2017 2000  BP: 114/89  (!) 169/83 (!) 167/87  Pulse: (!) 52 60 62 63  Resp: 16 (!) 31 20 (!) 21  Temp: (!) 97.5 F (36.4 C)   97.7 F (36.5 C)  TempSrc: Axillary   Axillary  SpO2: 96% 97% 96% 96%  Weight:   88.3 kg   Height:   5\' 8"  (1.727 m)    Eyes: Anicteric no pallor. ENMT: No discharge from the ears eyes nose or mouth. Neck: JVD elevated no mass felt. Respiratory: No rhonchi or crepitations. Cardiovascular: S1-S2 heard no murmurs appreciated. Abdomen: Soft nontender bowel sounds present. Musculoskeletal: Bilateral lower extremity edema present. Skin: No rash. Neurologic: Alert awake oriented to time place and person.  Moves all extremities. Psychiatric: Appears normal per normal affect.   Labs on Admission: I have personally reviewed following labs and imaging studies  CBC: Recent Labs  Lab 10/25/2017 1144  WBC 10.8*  NEUTROABS 8.1*  HGB 14.7  HCT 46.4  MCV 93.2  PLT 412   Basic Metabolic Panel: Recent Labs  Lab 10/22/2017 1144  NA 135  K 3.9  CL 95*  CO2 26  GLUCOSE 249*  BUN 18  CREATININE 0.98  CALCIUM 9.3   GFR: Estimated Creatinine Clearance: 75.8 mL/min (by C-G formula based on SCr of 0.98 mg/dL). Liver Function Tests: No results for input(s): AST, ALT, ALKPHOS, BILITOT, PROT, ALBUMIN in the last 168 hours. No results for input(s): LIPASE, AMYLASE in the last 168 hours. No results for input(s): AMMONIA in the last 168 hours. Coagulation Profile: No results for input(s): INR, PROTIME in the last 168 hours. Cardiac Enzymes: Recent Labs  Lab 10/19/2017 1144  TROPONINI <0.03   BNP (last 3 results) No results for input(s): PROBNP in the last 8760 hours. HbA1C: No results for input(s): HGBA1C in the last 72 hours. CBG: Recent Labs  Lab 11/08/2017 1804  GLUCAP 169*   Lipid Profile: No results for input(s): CHOL, HDL, LDLCALC, TRIG, CHOLHDL, LDLDIRECT in the last 72 hours. Thyroid Function Tests: No results for input(s): TSH, T4TOTAL,  FREET4, T3FREE, THYROIDAB in the last 72 hours. Anemia Panel: No results for input(s): VITAMINB12, FOLATE, FERRITIN, TIBC, IRON, RETICCTPCT in the last 72 hours. Urine analysis:    Component Value Date/Time   COLORURINE YELLOW 10/02/2017 1420   APPEARANCEUR CLOUDY (A) 10/02/2017 1420   LABSPEC 1.015 10/02/2017 1420   PHURINE 6.0 10/02/2017 1420   GLUCOSEU TRACE (A) 10/02/2017 1420   HGBUR 2+ (A) 10/02/2017 1420   KETONESUR NEGATIVE 10/02/2017 1420   PROTEINUR 2+ (A) 10/02/2017 1420   NITRITE NEGATIVE 10/02/2017 1420   LEUKOCYTESUR 3+ (  A) 10/02/2017 1420   Sepsis Labs: @LABRCNTIP (procalcitonin:4,lacticidven:4) ) Recent Results (from the past 240 hour(s))  MRSA PCR Screening     Status: None   Collection Time: 10/31/2017  6:15 PM  Result Value Ref Range Status   MRSA by PCR NEGATIVE NEGATIVE Final    Comment:        The GeneXpert MRSA Assay (FDA approved for NASAL specimens only), is one component of a comprehensive MRSA colonization surveillance program. It is not intended to diagnose MRSA infection nor to guide or monitor treatment for MRSA infections. Performed at [facility], [place] [address] [place], [place] [address]      Radiological Exams on Admission: Dg Chest Portable 1 View  Result Date: 11/13/2017 CLINICAL DATA:  Shortness of breath and cough for 2 days, history of atrial fibrillation, hypertension, abdominal aortic aneurysm repair, diabetes mellitus EXAM: PORTABLE CHEST 1 VIEW COMPARISON:  Portable exam 1153 hours compared to 01/02/2017 FINDINGS: Enlargement of cardiac silhouette. Mediastinal contours and pulmonary vascularity normal. Calcified tortuous thoracic aorta. Mild scarring RIGHT base. No acute infiltrate, pleural effusion, or pneumothorax. Questionable new lytic lesion at the RIGHT humeral head versus artifact. IMPRESSION: RIGHT basilar scarring. No acute infiltrate period. Questionable lytic lesion at the RIGHT humeral head versus artifact;  dedicated RIGHT shoulder radiographs recommended to assess. Electronically Signed   By: Lavonia Dana M.D.   On: 11/01/2017 12:15   Dg Shoulder Right Portable  Result Date: 10/26/2017 CLINICAL DATA:  Assess right shoulder for lytic lesion. EXAM: PORTABLE RIGHT SHOULDER COMPARISON:  None. FINDINGS: No discrete lytic lesion identified. AC joint degenerative changes noted. Degenerative changes. Calcific tendinosis at the rotator cuff tendon. No other acute abnormalities. IMPRESSION: No discrete lytic lesions seen in the right humeral head. Degenerative changes. Electronically Signed   By: Dorise Bullion III M.D   On: 10/22/2017 13:46    EKG: Independently reviewed.  A. fib rate controlled.  Assessment/Plan Principal Problem:   Acute hypoxemic respiratory failure ([facility]) Active Problems:   Hypertension, uncontrolled   AAA (abdominal aortic aneurysm, ruptured) ([facility])   A-fib (H[facility])   Type 2 diabetes mellitus with vascular disease ([place])   Acute CHF (congestive heart failure) ([place])    1. Acute hypoxic respiratory failure likely from new onset CHF last EF measured was in February 02 1953 to 60%.  Patient received 80 mg IV Lasix in the ER.  I have ordered 60 mg IV every 12.  Cycle cardiac markers closely follow intake output metabolic panel. 2. Atrial fibrillation rate controlled on metoprolol and is on Xarelto. 3. Hypertension on metoprolol lisinopril and amlodipine. 4. Hyperlipidemia on statins. 5. History of abdominal aortic aneurysm repair. 6. Diabetes mellitus type 2 we will keep patient on sliding scale coverage.   DVT prophylaxis: Xarelto. Code Status: Full code. Family Communication: Discussed with patient. Disposition Plan: Home. Consults called: Cardiology. Admission status: Observation.   Rise Patience MD [facility] Pager [phone].  If 7PM-7AM, please contact night-coverage [email or web address] Password [email or web address]  11/07/2017, 10:48 PM

## 2017-10-30 NOTE — ED Notes (Signed)
Report to [facility].  

## 2017-10-31 ENCOUNTER — Ambulatory Visit (HOSPITAL_COMMUNITY): Admit: 2017-10-31 | Payer: PPO | Admitting: Cardiovascular Disease

## 2017-10-31 ENCOUNTER — Encounter (HOSPITAL_COMMUNITY): Payer: Self-pay | Admitting: Cardiovascular Disease

## 2017-10-31 ENCOUNTER — Encounter (HOSPITAL_COMMUNITY): Admission: EM | Disposition: E | Payer: Self-pay | Source: Home / Self Care | Attending: Cardiovascular Disease

## 2017-10-31 ENCOUNTER — Observation Stay (HOSPITAL_BASED_OUTPATIENT_CLINIC_OR_DEPARTMENT_OTHER): Payer: PPO

## 2017-10-31 ENCOUNTER — Other Ambulatory Visit: Payer: Self-pay | Admitting: *Deleted

## 2017-10-31 DIAGNOSIS — I351 Nonrheumatic aortic (valve) insufficiency: Secondary | ICD-10-CM | POA: Diagnosis present

## 2017-10-31 DIAGNOSIS — E785 Hyperlipidemia, unspecified: Secondary | ICD-10-CM | POA: Diagnosis present

## 2017-10-31 DIAGNOSIS — N170 Acute kidney failure with tubular necrosis: Secondary | ICD-10-CM | POA: Diagnosis not present

## 2017-10-31 DIAGNOSIS — I4901 Ventricular fibrillation: Secondary | ICD-10-CM | POA: Diagnosis not present

## 2017-10-31 DIAGNOSIS — J189 Pneumonia, unspecified organism: Secondary | ICD-10-CM | POA: Diagnosis not present

## 2017-10-31 DIAGNOSIS — I249 Acute ischemic heart disease, unspecified: Secondary | ICD-10-CM | POA: Diagnosis not present

## 2017-10-31 DIAGNOSIS — R6521 Severe sepsis with septic shock: Secondary | ICD-10-CM | POA: Diagnosis not present

## 2017-10-31 DIAGNOSIS — R570 Cardiogenic shock: Secondary | ICD-10-CM | POA: Diagnosis present

## 2017-10-31 DIAGNOSIS — I700 Atherosclerosis of aorta: Secondary | ICD-10-CM | POA: Diagnosis present

## 2017-10-31 DIAGNOSIS — I5041 Acute combined systolic (congestive) and diastolic (congestive) heart failure: Secondary | ICD-10-CM | POA: Diagnosis not present

## 2017-10-31 DIAGNOSIS — I25110 Atherosclerotic heart disease of native coronary artery with unstable angina pectoris: Secondary | ICD-10-CM | POA: Diagnosis present

## 2017-10-31 DIAGNOSIS — I2510 Atherosclerotic heart disease of native coronary artery without angina pectoris: Secondary | ICD-10-CM | POA: Diagnosis not present

## 2017-10-31 DIAGNOSIS — I2109 ST elevation (STEMI) myocardial infarction involving other coronary artery of anterior wall: Secondary | ICD-10-CM | POA: Diagnosis present

## 2017-10-31 DIAGNOSIS — D6959 Other secondary thrombocytopenia: Secondary | ICD-10-CM | POA: Diagnosis not present

## 2017-10-31 DIAGNOSIS — I10 Essential (primary) hypertension: Secondary | ICD-10-CM

## 2017-10-31 DIAGNOSIS — J9602 Acute respiratory failure with hypercapnia: Secondary | ICD-10-CM | POA: Diagnosis not present

## 2017-10-31 DIAGNOSIS — I5031 Acute diastolic (congestive) heart failure: Secondary | ICD-10-CM

## 2017-10-31 DIAGNOSIS — R579 Shock, unspecified: Secondary | ICD-10-CM | POA: Diagnosis not present

## 2017-10-31 DIAGNOSIS — I5043 Acute on chronic combined systolic (congestive) and diastolic (congestive) heart failure: Secondary | ICD-10-CM | POA: Diagnosis present

## 2017-10-31 DIAGNOSIS — I237 Postinfarction angina: Secondary | ICD-10-CM | POA: Diagnosis present

## 2017-10-31 DIAGNOSIS — E1151 Type 2 diabetes mellitus with diabetic peripheral angiopathy without gangrene: Secondary | ICD-10-CM | POA: Diagnosis present

## 2017-10-31 DIAGNOSIS — I482 Chronic atrial fibrillation, unspecified: Secondary | ICD-10-CM | POA: Diagnosis not present

## 2017-10-31 DIAGNOSIS — Y95 Nosocomial condition: Secondary | ICD-10-CM | POA: Diagnosis present

## 2017-10-31 DIAGNOSIS — J9601 Acute respiratory failure with hypoxia: Secondary | ICD-10-CM | POA: Diagnosis present

## 2017-10-31 DIAGNOSIS — Y832 Surgical operation with anastomosis, bypass or graft as the cause of abnormal reaction of the patient, or of later complication, without mention of misadventure at the time of the procedure: Secondary | ICD-10-CM | POA: Diagnosis not present

## 2017-10-31 DIAGNOSIS — I480 Paroxysmal atrial fibrillation: Secondary | ICD-10-CM | POA: Diagnosis not present

## 2017-10-31 DIAGNOSIS — I252 Old myocardial infarction: Secondary | ICD-10-CM | POA: Diagnosis not present

## 2017-10-31 DIAGNOSIS — A419 Sepsis, unspecified organism: Secondary | ICD-10-CM | POA: Diagnosis not present

## 2017-10-31 DIAGNOSIS — I110 Hypertensive heart disease with heart failure: Secondary | ICD-10-CM | POA: Diagnosis present

## 2017-10-31 DIAGNOSIS — R079 Chest pain, unspecified: Secondary | ICD-10-CM

## 2017-10-31 DIAGNOSIS — I97190 Other postprocedural cardiac functional disturbances following cardiac surgery: Secondary | ICD-10-CM | POA: Diagnosis not present

## 2017-10-31 DIAGNOSIS — E663 Overweight: Secondary | ICD-10-CM | POA: Diagnosis present

## 2017-10-31 DIAGNOSIS — D62 Acute posthemorrhagic anemia: Secondary | ICD-10-CM | POA: Diagnosis not present

## 2017-10-31 DIAGNOSIS — I310 Chronic adhesive pericarditis: Secondary | ICD-10-CM | POA: Diagnosis present

## 2017-10-31 DIAGNOSIS — J80 Acute respiratory distress syndrome: Secondary | ICD-10-CM | POA: Diagnosis not present

## 2017-10-31 DIAGNOSIS — E669 Obesity, unspecified: Secondary | ICD-10-CM | POA: Diagnosis present

## 2017-10-31 DIAGNOSIS — E119 Type 2 diabetes mellitus without complications: Secondary | ICD-10-CM | POA: Diagnosis not present

## 2017-10-31 HISTORY — PX: LEFT HEART CATH AND CORONARY ANGIOGRAPHY: CATH118249

## 2017-10-31 LAB — GLUCOSE, CAPILLARY
GLUCOSE-CAPILLARY: 158 mg/dL — AB (ref 70–99)
GLUCOSE-CAPILLARY: 137 mg/dL — AB (ref 70–99)
Glucose-Capillary: 164 mg/dL — ABNORMAL HIGH (ref 70–99)
Glucose-Capillary: 167 mg/dL — ABNORMAL HIGH (ref 70–99)
Glucose-Capillary: 159 mg/dL — ABNORMAL HIGH (ref 70–99)

## 2017-10-31 LAB — HEPATIC FUNCTION PANEL
ALK PHOS: 66 U/L (ref 38–126)
ALT: 15 U/L (ref 0–44)
AST: 17 U/L (ref 15–41)
Albumin: 3.9 g/dL (ref 3.5–5.0)
BILIRUBIN INDIRECT: 0.3 mg/dL (ref 0.3–0.9)
BILIRUBIN TOTAL: 0.5 mg/dL (ref 0.3–1.2)
Bilirubin, Direct: 0.2 mg/dL (ref 0.0–0.2)
Total Protein: 7.1 g/dL (ref 6.5–8.1)

## 2017-10-31 LAB — TROPONIN I
TROPONIN I: 0.96 ng/mL — AB (ref <0.03)
TROPONIN I: 0.62 ng/mL — AB (ref <0.03)
Troponin I: 1.11 ng/mL (ref <0.03)
Troponin I: <0.03 ng/mL (ref <0.03)

## 2017-10-31 LAB — APTT
APTT: 37 s — AB (ref 24–36)
aPTT: 83 seconds — ABNORMAL HIGH (ref 24–36)

## 2017-10-31 LAB — BASIC METABOLIC PANEL
ANION GAP: 9 (ref 5–15)
BUN: 15 mg/dL (ref 8–23)
CO2: 26 mmol/L (ref 22–32)
Calcium: 7.7 mg/dL — ABNORMAL LOW (ref 8.9–10.3)
Chloride: 103 mmol/L (ref 98–111)
Creatinine, Ser: 0.91 mg/dL (ref 0.61–1.24)
GFR calc Af Amer: >60 mL/min (ref >60)
GFR calc non Af Amer: >60 mL/min (ref >60)
GLUCOSE: 208 mg/dL — AB (ref 70–99)
POTASSIUM: 3.6 mmol/L (ref 3.5–5.1)
Sodium: 138 mmol/L (ref 135–145)

## 2017-10-31 LAB — CBC
HEMATOCRIT: 37 % — AB (ref 39.0–52.0)
HEMOGLOBIN: 11.7 g/dL — AB (ref 13.0–17.0)
MCH: 29 pg (ref 26.0–34.0)
MCHC: 31.6 g/dL (ref 30.0–36.0)
MCV: 91.6 fL (ref 80.0–100.0)
Platelets: 184 10*3/uL (ref 150–400)
RBC: 4.04 MIL/uL — AB (ref 4.22–5.81)
RDW: 13.6 % (ref 11.5–15.5)
WBC: 7 10*3/uL (ref 4.0–10.5)
nRBC: 0 % (ref 0.0–0.2)

## 2017-10-31 LAB — HEPARIN LEVEL (UNFRACTIONATED): Heparin Unfractionated: 0.75 IU/mL — ABNORMAL HIGH (ref 0.30–0.70)

## 2017-10-31 LAB — HIV ANTIBODY (ROUTINE TESTING W REFLEX): HIV SCREEN 4TH GENERATION: NONREACTIVE

## 2017-10-31 SURGERY — LEFT HEART CATH AND CORONARY ANGIOGRAPHY
Anesthesia: LOCAL

## 2017-10-31 MED ORDER — ATORVASTATIN CALCIUM 80 MG PO TABS
80.0000 mg | ORAL_TABLET | Freq: Once | ORAL | Status: AC
  Administered 2017-10-31: 80 mg via ORAL

## 2017-10-31 MED ORDER — MIDAZOLAM HCL 2 MG/2ML IJ SOLN
INTRAMUSCULAR | Status: AC
  Filled 2017-10-31: qty 2

## 2017-10-31 MED ORDER — ASPIRIN 81 MG PO CHEW
243.0000 mg | CHEWABLE_TABLET | ORAL | Status: AC
  Administered 2017-10-31: 243 mg via ORAL

## 2017-10-31 MED ORDER — VERAPAMIL HCL 2.5 MG/ML IV SOLN
INTRAVENOUS | Status: AC
  Filled 2017-10-31: qty 2

## 2017-10-31 MED ORDER — SODIUM CHLORIDE 0.9% FLUSH: 3.0000 mL | INTRAVENOUS | Status: DC | PRN

## 2017-10-31 MED ORDER — HEPARIN (PORCINE) IN NACL 1000-0.9 UT/500ML-% IV SOLN
INTRAVENOUS | Status: AC
  Filled 2017-10-31: qty 1000

## 2017-10-31 MED ORDER — ATORVASTATIN CALCIUM 80 MG PO TABS: 80.0000 mg | ORAL_TABLET | ORAL | Status: DC

## 2017-10-31 MED ORDER — ASPIRIN 81 MG PO CHEW: 243.0000 mg | CHEWABLE_TABLET | ORAL | Status: DC

## 2017-10-31 MED ORDER — ASPIRIN 81 MG PO CHEW
CHEWABLE_TABLET | ORAL | Status: AC
  Administered 2017-10-31: 81 mg via ORAL
  Filled 2017-10-31: qty 1

## 2017-10-31 MED ORDER — NITROGLYCERIN 1 MG/10 ML FOR IR/CATH LAB
INTRA_ARTERIAL | Status: AC
  Filled 2017-10-31: qty 10

## 2017-10-31 MED ORDER — VERAPAMIL HCL 2.5 MG/ML IV SOLN
INTRAVENOUS | Status: DC | PRN
  Administered 2017-10-31: 10 mL via INTRA_ARTERIAL

## 2017-10-31 MED ORDER — MIDAZOLAM HCL 2 MG/2ML IJ SOLN
INTRAMUSCULAR | Status: DC | PRN
  Administered 2017-10-31: 1 mg via INTRAVENOUS

## 2017-10-31 MED ORDER — ASPIRIN 81 MG PO CHEW
81.0000 mg | CHEWABLE_TABLET | ORAL | Status: AC
  Administered 2017-10-31: 81 mg via ORAL

## 2017-10-31 MED ORDER — SODIUM CHLORIDE 0.9 % IV SOLN
INTRAVENOUS | Status: DC
  Administered 2017-10-31: 03:00:00 via INTRAVENOUS

## 2017-10-31 MED ORDER — IOHEXOL 350 MG/ML SOLN
INTRAVENOUS | Status: DC | PRN
  Administered 2017-10-31: 120 mL via INTRA_ARTERIAL

## 2017-10-31 MED ORDER — FUROSEMIDE 10 MG/ML IJ SOLN
40.0000 mg | Freq: Once | INTRAMUSCULAR | Status: AC
  Administered 2017-11-01: 40 mg via INTRAVENOUS
  Filled 2017-10-31: qty 4

## 2017-10-31 MED ORDER — NITROGLYCERIN 1 MG/10 ML FOR IR/CATH LAB
INTRA_ARTERIAL | Status: DC | PRN
  Administered 2017-10-31: 200 ug via INTRACORONARY

## 2017-10-31 MED ORDER — ATORVASTATIN CALCIUM 80 MG PO TABS
80.0000 mg | ORAL_TABLET | Freq: Every day | ORAL | Status: DC
  Administered 2017-10-31 – 2017-11-01 (×2): 80 mg via ORAL
  Filled 2017-10-31 (×2): qty 1

## 2017-10-31 MED ORDER — ACETAMINOPHEN 325 MG PO TABS
650.0000 mg | ORAL_TABLET | ORAL | Status: DC | PRN
  Administered 2017-10-31 – 2017-11-02 (×6): 650 mg via ORAL
  Filled 2017-10-31 (×6): qty 2

## 2017-10-31 MED ORDER — NITROGLYCERIN IN D5W 200-5 MCG/ML-% IV SOLN
INTRAVENOUS | Status: AC
  Administered 2017-10-31: 50 ug/min via INTRAVENOUS
  Filled 2017-10-31: qty 250

## 2017-10-31 MED ORDER — ONDANSETRON HCL 4 MG/2ML IJ SOLN: 4.0000 mg | Freq: Four times a day (QID) | INTRAMUSCULAR | Status: DC | PRN

## 2017-10-31 MED ORDER — DIAZEPAM 5 MG PO TABS: 5.0000 mg | ORAL_TABLET | Freq: Four times a day (QID) | ORAL | Status: DC | PRN

## 2017-10-31 MED ORDER — SODIUM CHLORIDE 0.9 % IV SOLN
250.0000 mL | INTRAVENOUS | Status: DC | PRN
  Administered 2017-11-02 (×3): via INTRAVENOUS

## 2017-10-31 MED ORDER — LIDOCAINE HCL (PF) 1 % IJ SOLN
INTRAMUSCULAR | Status: AC
  Filled 2017-10-31: qty 30

## 2017-10-31 MED ORDER — ASPIRIN 81 MG PO CHEW
CHEWABLE_TABLET | ORAL | Status: AC
  Administered 2017-10-31: 243 mg via ORAL
  Filled 2017-10-31: qty 3

## 2017-10-31 MED ORDER — FENTANYL CITRATE (PF) 100 MCG/2ML IJ SOLN
INTRAMUSCULAR | Status: DC | PRN
  Administered 2017-10-31: 25 ug via INTRAVENOUS

## 2017-10-31 MED ORDER — LIDOCAINE HCL (PF) 1 % IJ SOLN
INTRAMUSCULAR | Status: DC | PRN
  Administered 2017-10-31: 2 mL

## 2017-10-31 MED ORDER — NITROGLYCERIN IN D5W 200-5 MCG/ML-% IV SOLN
2.0000 ug/min | INTRAVENOUS | Status: DC
  Administered 2017-10-31 – 2017-11-01 (×4): 50 ug/min via INTRAVENOUS
  Administered 2017-11-02: 40 ug/min via INTRAVENOUS
  Administered 2017-11-02: 50 ug/min via INTRAVENOUS
  Administered 2017-11-02: 45 ug/min via INTRAVENOUS
  Filled 2017-10-31 (×3): qty 250

## 2017-10-31 MED ORDER — ASPIRIN 81 MG PO CHEW
81.0000 mg | CHEWABLE_TABLET | Freq: Every day | ORAL | Status: DC
  Administered 2017-10-31 – 2017-11-01 (×2): 81 mg via ORAL
  Filled 2017-10-31 (×2): qty 1

## 2017-10-31 MED ORDER — HEPARIN (PORCINE) IN NACL 1000-0.9 UT/500ML-% IV SOLN
INTRAVENOUS | Status: DC | PRN
  Administered 2017-10-31: 500 mL

## 2017-10-31 MED ORDER — FENTANYL CITRATE (PF) 100 MCG/2ML IJ SOLN
INTRAMUSCULAR | Status: AC
  Filled 2017-10-31: qty 2

## 2017-10-31 MED ORDER — SODIUM CHLORIDE 0.9% FLUSH
3.0000 mL | Freq: Two times a day (BID) | INTRAVENOUS | Status: DC
  Administered 2017-10-31 – 2017-11-01 (×3): 3 mL via INTRAVENOUS

## 2017-10-31 MED ORDER — HEPARIN (PORCINE) IN NACL 100-0.45 UNIT/ML-% IJ SOLN
1200.0000 [IU]/h | INTRAMUSCULAR | Status: DC
  Administered 2017-10-31 – 2017-11-01 (×2): 1250 [IU]/h via INTRAVENOUS
  Administered 2017-11-02: 1200 [IU]/h via INTRAVENOUS
  Filled 2017-10-31 (×3): qty 250

## 2017-10-31 MED ORDER — HEPARIN (PORCINE) IN NACL 100-0.45 UNIT/ML-% IJ SOLN: 1250.0000 [IU]/h | INTRAMUSCULAR | Status: DC

## 2017-10-31 MED ORDER — ASPIRIN 81 MG PO CHEW: 243.0000 mg | CHEWABLE_TABLET | Freq: Every day | ORAL | Status: DC

## 2017-10-31 MED ORDER — FUROSEMIDE 10 MG/ML IJ SOLN
40.0000 mg | Freq: Once | INTRAMUSCULAR | Status: AC
  Administered 2017-10-31: 40 mg via INTRAVENOUS
  Filled 2017-10-31: qty 4

## 2017-10-31 SURGICAL SUPPLY — 13 items
CATH 5FR JL3.5 JR4 ANG PIG MP (CATHETERS) ×1 IMPLANT
CATH INFINITI 5FR JL4 (CATHETERS) ×1 IMPLANT
CATH OPTITORQUE TIG 4.0 5F (CATHETERS) ×1 IMPLANT
DEVICE RAD COMP TR BAND LRG (VASCULAR PRODUCTS) ×1 IMPLANT
GLIDESHEATH SLEND SS 6F .021 (SHEATH) ×1 IMPLANT
GUIDEWIRE INQWIRE 1.5J.035X260 (WIRE) IMPLANT
INQWIRE 1.5J .035X260CM (WIRE) ×2
KIT ENCORE 26 ADVANTAGE (KITS) ×1 IMPLANT
KIT HEART LEFT (KITS) ×2 IMPLANT
PACK CARDIAC CATHETERIZATION (CUSTOM PROCEDURE TRAY) ×2 IMPLANT
SYR MEDRAD MARK V 150ML (SYRINGE) ×2 IMPLANT
TRANSDUCER W/STOPCOCK (MISCELLANEOUS) ×2 IMPLANT
TUBING CIL FLEX 10 FLL-RA (TUBING) ×2 IMPLANT

## 2017-10-31 NOTE — Consult Note (Addendum)
[address]       [place] [address]             [phone]        Charles Bailey [facility] [facility] Medical Record #[id number] Date of Birth: 05/08/1945  Referring: Dr. Claiborne Billings Primary Care: Susy Frizzle, MD Primary Cardiologist: Dr. Johnsie Cancel  Chief Complaint:    Chief Complaint  Patient presents with  . Cough  . Shortness of Breath    History of Present Illness:      Mr. Hosley is a 72 yo white male with known history of DM on Metformin, Chronic Atrial Fibrillation, S/P Cardioversion which failed, currently takes Xarelto last dose 10/16, Hypertension, Hyperlipidemia, and Repair of a Ruptured AAA approximately 12 years with complicated hospital course, ultimately requiring trach to get off ventilator.  He was in his normal state of health until presentation.  He developed complaints of shortness of breath over the past several days.  His symptoms worsened and he noticed lower extremity edema.  He presented to the ED for evaluation.  BNP was elevated about 700.  He was placed on BIPAP which improved his breathing and was treated with a IV dose of Lasix.  He was admitted to medicine for further care.  Upon admission Cardiology consult was obtained.  Further review of EKG showed ST segment changes with minimal elevation in his Troponin levels.  He underwent cardiac catheterization which showed preserved EF and a left main stenosis.  It was felt coronary bypass grafting would be indicated and Cardiothoracic consultation has been requested.  Currently the patient states he is feeling better overall.  He denies chest pain and shortness of breath.  He states he remains swollen, but this has improved since admission.  He is a former smoker, having quit in 2007.  In regards to his diabetes he does not know how his sugars run as he does not check them.  He states that he is not very active.  He is able to do his daily chores without difficulty.  He states this year he had to purchase a  riding Conservation officer, nature as he can no longer use a push mower.  He does admit to having a tracheostomy when he underwent repair of his aneurysm back in 2007 but they family was unsure regarding the circumstances.  He is agreeable to proceed with surgery.  Current Activity/ Functional Status: Patient is independent with mobility/ambulation, transfers, ADL's, IADL's.   Zubrod Score: At the time of surgery this patient's most appropriate activity status/level should be described as: []     0    Normal activity, no symptoms [x]     1    Restricted in physical strenuous activity but ambulatory, able to do out light work []     2    Ambulatory and capable of self care, unable to do work activities, up and about                 more than 50%  Of the time                            []     3    Only limited self care, in bed greater than 50% of waking hours []     4    Completely disabled, no self care, confined to bed or chair []     5    Moribund  Past Medical History:  Diagnosis Date  . A-fib ([facility])   . Adenomatous colon polyp 2003  . CHF (congestive heart failure) ([facility])   . Diabetes mellitus   . Hyperlipidemia   . Hypertension     Past Surgical History:  Procedure Laterality Date  . ABDOMINAL AORTIC ANEURYSM REPAIR  2007  . CARDIOVERSION N/A 03/29/2014   Procedure: CARDIOVERSION;  Surgeon: Josue Hector, MD;  Location: [facility];  Service: Cardiovascular;  Laterality: N/A;  . INGUINAL HERNIA REPAIR Right 1968  . LEFT HEART CATH AND CORONARY ANGIOGRAPHY N/A 10/15/2017   Procedure: LEFT HEART CATH AND CORONARY ANGIOGRAPHY;  Surgeon: Troy Sine, MD;  Location: [facility];  Service: Cardiovascular;  Laterality: N/A;    Social History   Tobacco Use  Smoking Status Former Smoker  . Years: 15.00  . Types: Cigarettes  . Last attempt to quit: 03/03/2005  . Years since quitting: 12.6  Smokeless Tobacco Never Used    Social History   Substance and Sexual Activity  Alcohol Use Yes  .  Alcohol/week: 4.0 standard drinks  . Types: 4 Cans of beer per week     No Known Allergies  Current Facility-Administered Medications  Medication Dose Route Frequency Provider Last Rate Last Dose  . 0.9 %  sodium chloride infusion  250 mL Intravenous PRN Troy Sine, MD      . acetaminophen (TYLENOL) tablet 650 mg  650 mg Oral Q4H PRN Troy Sine, MD   650 mg at 10/24/2017 1103  . amLODipine (NORVASC) tablet 10 mg  10 mg Oral Daily Black-Maier, Lane Hacker, MD   10 mg at 11/08/2017 1031  . aspirin chewable tablet 81 mg  81 mg Oral Daily Troy Sine, MD   81 mg at 10/17/2017 1032  . atorvastatin (LIPITOR) tablet 80 mg  80 mg Oral q1800 Troy Sine, MD      . cloNIDine (CATAPRES) tablet 0.2 mg  0.2 mg Oral BID Perley Jain, MD   0.2 mg at 10/15/2017 1032  . diazepam (VALIUM) tablet 5 mg  5 mg Oral Q6H PRN Troy Sine, MD      . Derrill Memo ON 11/01/2017] furosemide (LASIX) injection 40 mg  40 mg Intravenous Once Welford Roche, MD      . heparin ADULT infusion 100 units/mL (25000 units/242mL sodium chloride 0.45%)  1,250 Units/hr Intravenous Continuous Laren Everts, RPH 12.5 mL/hr at 10/24/2017 1000 1,250 Units/hr at 11/02/2017 1000  . insulin aspart (novoLOG) injection 0-9 Units  0-9 Units Subcutaneous TID WC Black-Maier, Lane Hacker, MD   2 Units at 10/20/2017 [id number]  . lisinopril (PRINIVIL,ZESTRIL) tablet 40 mg  40 mg Oral Daily Black-Maier, Lane Hacker, MD   40 mg at 10/26/2017 1032  . metoprolol tartrate (LOPRESSOR) tablet 50 mg  50 mg Oral BID Perley Jain, MD   50 mg at 11/12/2017 1031  . nitroGLYCERIN (NITROSTAT) 0.4 MG SL tablet           . nitroGLYCERIN 50 mg in dextrose 5 % 250 mL (0.2 mg/mL) infusion  2-200 mcg/min Intravenous Titrated Perley Jain, MD 15 mL/hr at 11/07/2017 1000 50 mcg/min at 10/23/2017 1000  . omega-3 acid ethyl esters (LOVAZA) capsule 1,000 mg  1,000 mg Oral Daily Black-Maier, Lane Hacker, MD   1,000 mg at 10/21/2017 1031  . ondansetron (ZOFRAN) tablet 4 mg  4  mg Oral Q6H PRN Black-Maier, Lane Hacker, MD       Or  . ondansetron [facility]) injection 4  mg  4 mg Intravenous Q6H PRN Black-Maier, Lane Hacker, MD      . ondansetron [facility]) injection 4 mg  4 mg Intravenous Q6H PRN Troy Sine, MD      . sodium chloride flush (NS) 0.9 % injection 3 mL  3 mL Intravenous Q12H Shelva Majestic A, MD      . sodium chloride flush (NS) 0.9 % injection 3 mL  3 mL Intravenous PRN Troy Sine, MD        Medications Prior to Admission  Medication Sig Dispense Refill Last Dose  . amLODipine (NORVASC) 10 MG tablet Take 1 tablet by mouth once daily (need to be seen before any further refills) (Patient taking differently: Take 10 mg by mouth daily. ) 90 tablet 2 11/03/2017 at Unknown time  . Cholecalciferol (VITAMIN D3) 1000 UNITS CAPS Take 2,000 Units by mouth daily.    10/29/2017  . cloNIDine (CATAPRES) 0.2 MG tablet Take 1 tablet by mouth twice a day (Patient taking differently: Take 0.2 mg by mouth 2 (two) times daily. ) 180 tablet 2 10/14/2017 at Unknown time  . fish oil-omega-3 fatty acids 1000 MG capsule Take 1,200 mg by mouth daily.   10/29/2017 at Unknown time  . furosemide (LASIX) 40 MG tablet TAKE 1 TABLET BY MOUTH EVERY DAY (Patient taking differently: Take 40 mg by mouth daily as needed (swelling). ) 90 tablet 3 10/28/2017  . KLOR-CON M20 20 MEQ tablet TAKE 1 TABLET BY MOUTH EVERY DAY (Patient taking differently: Take 20 mEq by mouth daily as needed (when taking furosemide). ) 90 tablet 3 10/28/2017  . lisinopril (PRINIVIL,ZESTRIL) 40 MG tablet Take 1 tablet by mouth every day (Patient taking differently: Take 40 mg by mouth daily. ) 90 tablet 2 11/06/2017 at Unknown time  . metFORMIN (GLUCOPHAGE) 1000 MG tablet Take 1 tablet by mouth twice a day with meals (Patient taking differently: Take 1,000 mg by mouth 2 (two) times daily with a meal. ) 180 tablet 1 10/29/2017 at Unknown time  . metoprolol tartrate (LOPRESSOR) 50 MG tablet Take 1 tablet by mouth twice a day  (Patient taking differently: Take 50 mg by mouth 2 (two) times daily. ) 180 tablet 2 10/28/2017 at 0900  . pravastatin (PRAVACHOL) 40 MG tablet Take 1 tablet by mouth daily (Patient taking differently: Take 40 mg by mouth every evening. ) 90 tablet 2 10/29/2017  . rivaroxaban (XARELTO) 20 MG TABS tablet Take 1 tablet (20 mg total) by mouth daily with supper. 30 tablet 3 10/29/2017    Family History  Problem Relation Age of Onset  . Heart disease Father   . Heart failure Mother        died in sleep at 17 years   . Diabetes Paternal Aunt   . Heart disease Paternal Uncle   . Heart disease Paternal Grandfather    Review of Systems:   ROS Constitutional: negative Eyes: negative Respiratory: positive for cough and shortness of breath Cardiovascular: positive for irregular heart beat and chronic Atrial Fibrillation on Xarelto, negative for chest pain Gastrointestinal: negative Integument/breast: negative Neurological: negative Endocrine: positive for DM     Cardiac Review of Systems: Y or  [    ]= no  Chest Pain [  N  ]  Resting SOB [ Y  ] Exertional SOB  [  ]  Orthopnea [  ]   Pedal Edema [Y   ]    Palpitations [  ] Syncope  [  ]  Presyncope [   ]  General Review of Systems: [Y] = yes [  ]=no Constitional: recent weight change [  ]; anorexia [  ]; fatigue [ Y ]; nausea [  ]; night sweats [  ]; fever [  ]; or chills [  ]                                                               Dental: Last Dentist visit:   Eye : blurred vision [  ]; diplopia [   ]; vision changes [  ];  Amaurosis fugax[  ]; Resp: cough [Y  ];  wheezing[  ];  hemoptysis[  ]; shortness of breath[ Y ]; paroxysmal nocturnal dyspnea[  ]; dyspnea on exertion[  ]; or orthopnea[  ];  GI:  gallstones[  ], vomiting[  ];  dysphagia[  ]; melena[  ];  hematochezia [  ]; heartburn[  ];   Hx of  Colonoscopy[  ]; GU: kidney stones [  ]; hematuria[  ];   dysuria [  ];  nocturia[  ];  history of     obstruction [  ]; urinary  frequency [  ]             Skin: rash, swelling[  ];, hair loss[  ];  peripheral edema[ Y ];  or itching[  ]; Musculosketetal: myalgias[  ];  joint swelling[  ];  joint erythema[  ];  joint pain[  ];  back pain[  ];  Heme/Lymph: bruising[  ];  bleeding[  ];  anemia[  ];  Neuro: TIA[  ];  headaches[  ];  stroke[ Y ];  vertigo[  ];  seizures[  ];   paresthesias[  ];  difficulty walking[  ];  Psych:depression[  ]; anxiety[  ];  Endocrine: diabetes[Y  ];  thyroid dysfunction[  ];  Physical Exam: BP 137/70   Pulse (!) 58   Temp 98 F (36.7 C) (Oral)   Resp 19   Ht 5\' 8"  (1.727 m)   Wt 85.4 kg   SpO2 96%   BMI 28.63 kg/m    General appearance: alert, cooperative and no distress Head: Normocephalic, without obvious abnormality, atraumatic Neck: + carotid bruit, worse on left Resp: clear to auscultation bilaterally Cardio: irregularly irregular rhythm GI: soft, non-tender; bowel sounds normal; no masses,  no organomegaly Extremities: edema 2+ pitting Neurologic: Grossly normal  Diagnostic Studies & Laboratory data:     Recent Radiology Findings:   Dg Chest Portable 1 View  Result Date: 10/22/2017 CLINICAL DATA:  Shortness of breath and cough for 2 days, history of atrial fibrillation, hypertension, abdominal aortic aneurysm repair, diabetes mellitus EXAM: PORTABLE CHEST 1 VIEW COMPARISON:  Portable exam 1153 hours compared to 01/02/2017 FINDINGS: Enlargement of cardiac silhouette. Mediastinal contours and pulmonary vascularity normal. Calcified tortuous thoracic aorta. Mild scarring RIGHT base. No acute infiltrate, pleural effusion, or pneumothorax. Questionable new lytic lesion at the RIGHT humeral head versus artifact. IMPRESSION: RIGHT basilar scarring. No acute infiltrate period. Questionable lytic lesion at the RIGHT humeral head versus artifact; dedicated RIGHT shoulder radiographs recommended to assess. Electronically Signed   By: Lavonia Dana M.D.   On: 10/16/2017 12:15   Dg  Shoulder Right Portable  Result Date: 11/11/2017 CLINICAL DATA:  Assess right  shoulder for lytic lesion. EXAM: PORTABLE RIGHT SHOULDER COMPARISON:  None. FINDINGS: No discrete lytic lesion identified. AC joint degenerative changes noted. Degenerative changes. Calcific tendinosis at the rotator cuff tendon. No other acute abnormalities. IMPRESSION: No discrete lytic lesions seen in the right humeral head. Degenerative changes. Electronically Signed   By: Dorise Bullion III M.D   On: 11/06/2017 13:46     I have independently reviewed the above radiologic studies and discussed with the patient   Recent Lab Findings: Lab Results  Component Value Date   WBC 7.0 10/20/2017   HGB 11.7 (L) 11/05/2017   HCT 37.0 (L) 10/15/2017   PLT 184 10/23/2017   GLUCOSE 208 (H) 10/29/2017   CHOL 114 05/28/2017   TRIG 205 (H) 05/28/2017   HDL 38 (L) 05/28/2017   LDLDIRECT 50 07/23/2013   LDLCALC 49 05/28/2017   ALT 15 10/22/2017   AST 17 10/17/2017   NA 138 10/30/2017   K 3.6 10/18/2017   CL 103 10/26/2017   CREATININE 0.91 11/08/2017   BUN 15 10/30/2017   CO2 26 10/16/2017   TSH 1.490 12/21/2013   HGBA1C 7.3 (H) 05/28/2017      Assessment / Plan:      1. CV- LM CAD- will need CABG 2. Chronic Atrial Fibrillation- on Xarelto, last dose 10/16, failed cardioversion in the past- may benefit from Atrial Clip or MAZE procedure 3. DM- on Metformin, doesn't chest sugars at home 4. H/O Tracheostomy after ruptured AAA- may need pulmonary consult, patient former smoker quit in 2007 5. Dispo- patient stable, chest pain free, denies shortness of breath, will need to complete preoperative workup, carotid duplex to assess carotid bruit, will get Pulmonary consult if needed as patient had a tracheostomy in the past, Dr. Roxy Manns will follow up with further recommendations    Junie Panning Barrett PA-C 10/31/2017 11:13 AM  I have seen and examined the patient and agree with the assessment as outlined by Ellwood Handler,  PA-C.  Patient is a 71 year old male with history of hypertension, persistent atrial fibrillation on long-term anticoagulation using Xarelto, hyperlipidemia, type II diabetes, and abdominal aortic aneurysm who was referred for surgical consultation to discuss treatment options for management of left main coronary artery disease.  Patient has no previous history of coronary artery disease.  His cardiac history dates back to 2016 when he was found to be in atrial fibrillation by his primary care physician.  He underwent a nuclear stress test at that time that was felt to be low risk.  An echocardiogram revealed preserved left ventricular function with severe left atrial enlargement.  He underwent DC cardioversion but rapidly went back into atrial fibrillation.  He has been managed with long-term anticoagulation and rate control ever since.  The patient states that he did well but has developed slow progression of symptoms of exertional shortness of breath and fatigue over the last several months culminating in resting shortness of breath with hypoxemia consistent consistent with acute exacerbation of chronic combined systolic and diastolic congestive heart failure.  Baseline EKG revealed atrial fibrillation without acute ST or T wave changes.  Chest x-ray was clear but BNP level was elevated.  Symptoms improved rapidly with intravenous Lasix and oxygen therapy using BiPAP.  Serial troponin levels became slightly elevated peaking at 1.11, consistent with possible non-ST segment elevation myocardial infarction.    I have personally reviewed the patient's diagnostic cardiac catheterization and transthoracic echocardiogram.  Diagnostic cardiac catheterization reveals 70% tubular stenosis of the left main coronary artery  with otherwise minimal nonobstructive coronary artery disease.  There is some noticeable calcification in the wall of the aortic root and ascending thoracic aorta.  Echocardiogram reveals mild to  moderate left ventricular systolic dysfunction with severe left atrial enlargement and aortic insufficiency.  The aortic insufficiency was reported as mild, but in my opinion it appears more significant and likely moderate in severity.  The aortic valve is trileaflet.  There is mild leaflet thickening but no significant aortic stenosis.  The jet of aortic insufficiency is eccentric.  There was no significant mitral regurgitation.  However, I do not appreciate a murmur on physical exam suggestive of significant aortic insufficiency.  I agree the patient would likely benefit from coronary artery bypass grafting.  He might also benefit from concomitant Maze procedure.  It might be wise to more carefully evaluate the severity of aortic insufficiency using transesophageal echocardiogram, although this could be performed at the time of surgery.  The aorta should be imaged to make sure there is not significant calcification or atherosclerotic disease which might considerably impact risks associated with surgery.  I have reviewed the indications, risks, and potential benefits of coronary artery bypass grafting with the patient and his family at the bedside.  Alternative treatment strategies have been discussed, including the relative risks, benefits and long term prognosis associated with medical therapy, percutaneous coronary intervention, and surgical revascularization.  The relative risks and benefits of performing a maze procedure at the time of their surgery was discussed at length, including the expected likelihood of long term freedom from recurrent symptomatic atrial fibrillation and/or atrial flutter.  We discussed the possibility of further evaluating the severity of aortic insufficiency using transesophageal echocardiogram.  Given the fact that findings will not change the fact that the patient needs surgery, we can plan to do this at the time of surgery under general anesthesia.  Based upon preoperative  transthoracic echocardiogram I doubt that the aortic insufficiency will be severe enough to warrant aortic valve repair or replacement, but under the circumstances it seems prudent to investigate it further.    At the next step the patient will undergo CT angiogram of the thoracic aorta.  We tentatively plan coronary artery bypass grafting and Maze procedure with intraoperative TEE on Wednesday November 05, 2017.  We will continue to follow along between now and the time of surgery.   I spent in excess of 90 minutes during the conduct of this hospital encounter and >50% of this time involved direct face-to-face encounter with the patient for counseling and/or coordination of their care.   Rexene Alberts, MD 10/29/2017 1:15 PM

## 2017-10-31 NOTE — Progress Notes (Signed)
Upon entering the room to introduce myself to the patient and complete bedside report pt was found to be diaphoretic and SOB. Pt noted to have wheezing and stated chest pain 5/10. MD made aware and EKG obtained. When he called back Hal Hope stated to give pt nitro and metropolol. EKG stated MI/STEMI. Rapid response was called to confirm and MD paged back to ask to come to the room. Pt was responsive. After first nitro pts pain was 3/10. Second nitro was given for elevated BP. Lipitor and asprin also given per MD. Pt was clipped, second IV started, consent obtained and CHG bath complete. Family called and updated by rapid response nurse. Rapid response at bedside with pt and transferred pt to the cath lab. All belongings sent with pt.

## 2017-10-31 NOTE — Significant Event (Signed)
Rapid Response Event Note  Overview:  Called by bedside RN stating pt c/o CP, diaphoretic, nitrox1 given with some relief, EKG stating Acute MI, Md paged and on way to see pt.    Initial Focused Assessment: On arrival pt sitting in bed clearly diaphoretic c/o CP 3/10 after nitro given. Clear ST elevation noted on bedside monitor. EKG taken prior to arrival with ST elevation (noted change from admission EKG). Dr Hal Hope at bedside.   Interventions: Cardiology notified, ASA 324 , Lipitor 80 given, code STEMI activated at 0013. Second IV started, groins, chest, wrist shaved, Zoll placed on pt. Nitro gtt started for BP control.   Plan of Care (if not transferred): Pt transferred to cath lab at 0050  Event Summary:  called at  0003   event ended at  [place]

## 2017-10-31 NOTE — Progress Notes (Signed)
Cardiology Consultation:   Patient ID: Charles Bailey; [id number]; 1945-06-03   Admit date: 10/19/2017 Date of Consult: 10/16/2017  Primary Care Provider: Susy Frizzle, MD Primary Cardiologist: No primary care provider on file. Primary Electrophysiologist:  None  Chief Complaint: Chest pain, diaphoresis   Patient Profile:  Charles Bailey is a 72 y.o. male with hypertension, hyperlipidemia, diabetes mellitus type 2, paroxysmal atrial fibrillation on rivaroxaban, abdominal aortic aneurysm status-post repair 2007, who is being seen today for the evaluation of ST segment elevations, diaphoresis, and chest discomfort at the request of Dr. Renetta Chalk  History of Present Illness:  Charles Bailey is a 72 y.o. male with hypertension, hyperlipidemia, diabetes mellitus type 2, paroxysmal atrial fibrillation on rivaroxaban, abdominal aortic aneurysm status-post repair 2007, who is being seen today for the evaluation of ST segment elevations, diaphoresis, and chest discomfort at the request of Dr. Renetta Chalk He felt ill with a cold for several days then yesterday developed worsening dyspnea, orthopnea, and could not sleep last night due to dyspnea. He presented to the emergency room. At that time his electrocardiogram was normal. He had negative troponin assay. His brain natriuretic peptide was 729. Examination was notable for lower extremity edema. He was admitted to general medicine for probable decompensated heart failure. He received furosemide. This evening he reported worsening dyspnea, diaphoresis, and chest discomfort. ST segment changes were notable on telemetry. 12-lead demonstrated ST elevations in lead aVL, lead V1, with reciprocal changes in the inferior leads. Repeat 12-lead ECG demonstrated ST elevations in lead I and aVL with inferior reciprocal changes. He had marked hypertension refractory to two nitroglycerine. STEMI team was activated. He received ASA 324, atorva 80. Last dose of  rivaroxaban last night. He was started on a nitroglycerine infusion for his elevated blood pressure. He was taken to the cardiac catheterization laboratory for urgent coronary angiography. By the time he arrived in the catheterization laboratory he was chest pain free and his ST segment changes had resolved. Coronary angiography demonstrated normal RCA, distal left main 70% lesion with some haziness, no significant occlusive disease in the circumflex or LAD.   Past Medical History:  Diagnosis Date  . A-fib ([place])   . Adenomatous colon polyp 2003  . CHF (congestive heart failure) ([place])   . Diabetes mellitus   . Hyperlipidemia   . Hypertension     Past Surgical History:  Procedure Laterality Date  . ABDOMINAL AORTIC ANEURYSM REPAIR  2007  . CARDIOVERSION N/A 03/29/2014   Procedure: CARDIOVERSION;  Surgeon: Josue Hector, MD;  Location: [facility] ENDOSCOPY;  Service: Cardiovascular;  Laterality: N/A;  . INGUINAL HERNIA REPAIR Right 1968     Inpatient Medications: Scheduled Meds: . amLODipine  10 mg Oral Daily  . aspirin  243 mg Oral Daily  . cloNIDine  0.2 mg Oral BID  . furosemide  60 mg Intravenous Q12H  . insulin aspart  0-9 Units Subcutaneous TID WC  . lisinopril  40 mg Oral Daily  . metoprolol tartrate  50 mg Oral BID  . nitroGLYCERIN      . omega-3 acid ethyl esters  1,000 mg Oral Daily  . potassium chloride SA  20 mEq Oral Daily  . pravastatin  40 mg Oral q1800  . rivaroxaban  20 mg Oral Q supper   Continuous Infusions:  PRN Meds: acetaminophen **OR** acetaminophen, nitroGLYCERIN, ondansetron **OR** ondansetron (ZOFRAN) IV  Home Meds: Prior to Admission medications   Medication Sig Start Date End Date Taking? Authorizing Provider  amLODipine (  NORVASC) 10 MG tablet Take 1 tablet by mouth once daily (need to be seen before any further refills) 02/17/17   Susy Frizzle, MD  Cholecalciferol (VITAMIN D3) 1000 UNITS CAPS Take 2,000 Units by mouth daily.     [provider]  cloNIDine (CATAPRES) 0.2 MG tablet Take 1 tablet by mouth twice a day 06/18/17   Susy Frizzle, MD  fish oil-omega-3 fatty acids 1000 MG capsule Take 1,200 mg by mouth daily.    [provider]  furosemide (LASIX) 40 MG tablet TAKE 1 TABLET BY MOUTH EVERY DAY Patient taking differently: 1 tab po prn swelling 06/17/17   Susy Frizzle, MD  KLOR-CON M20 20 MEQ tablet TAKE 1 TABLET BY MOUTH EVERY DAY Patient taking differently: 1 tab po prn furosemide 06/17/17   Susy Frizzle, MD  lisinopril (PRINIVIL,ZESTRIL) 40 MG tablet Take 1 tablet by mouth every day 05/05/17   Susy Frizzle, MD  metFORMIN (GLUCOPHAGE) 1000 MG tablet Take 1 tablet by mouth twice a day with meals 08/19/17   Susy Frizzle, MD  metoprolol tartrate (LOPRESSOR) 50 MG tablet Take 1 tablet by mouth twice a day 08/29/17   Susy Frizzle, MD  pravastatin (PRAVACHOL) 40 MG tablet Take 1 tablet by mouth daily 04/09/17   Susy Frizzle, MD  rivaroxaban (XARELTO) 20 MG TABS tablet Take 1 tablet (20 mg total) by mouth daily with supper. 10/21/17   Susy Frizzle, MD    Allergies:   No Known Allergies  Social History:   Social History   Socioeconomic History  . Marital status: Married    Spouse name: Not on file  . Number of children: Not on file  . Years of education: Not on file  . Highest education level: Not on file  Occupational History  . Not on file  Social Needs  . Financial resource strain: Not on file  . Food insecurity:    Worry: Not on file    Inability: Not on file  . Transportation needs:    Medical: Not on file    Non-medical: Not on file  Tobacco Use  . Smoking status: Former Smoker    Years: 15.00    Types: Cigarettes    Last attempt to quit: 03/03/2005    Years since quitting: 12.6  . Smokeless tobacco: Never Used  Substance and Sexual Activity  . Alcohol use: Yes    Alcohol/week: 4.0 standard drinks    Types: 4 Cans of beer per week  . Drug use: No  . Sexual activity:  Not on file  Lifestyle  . Physical activity:    Days per week: Not on file    Minutes per session: Not on file  . Stress: Not on file  Relationships  . Social connections:    Talks on phone: Not on file    Gets together: Not on file    Attends religious service: Not on file    Active member of club or organization: Not on file    Attends meetings of clubs or organizations: Not on file    Relationship status: Not on file  . Intimate partner violence:    Fear of current or ex partner: Not on file    Emotionally abused: Not on file    Physically abused: Not on file    Forced sexual activity: Not on file  Other Topics Concern  . Not on file  Social History Narrative  . Not on file  Family History:   The patient's family history includes Diabetes in his paternal aunt; Heart disease in his father, paternal grandfather, and paternal uncle; Heart failure in his mother.  ROS:  Please see the history of present illness.   Physical Exam/Data:   Vitals:   11/06/2017 1800 10/18/2017 2000 11/09/2017 0000 10/20/2017 0009  BP: (!) 169/83 (!) 167/87 122/90 (!) 154/102  Pulse: 62 63    Resp: 20 (!) 21 (!) 43 (!) 29  Temp:  97.7 F (36.5 C)    TempSrc:  Axillary    SpO2: 96% 96% 96% 96%  Weight: 88.3 kg     Height: 5\' 8"  (1.727 m)       Intake/Output Summary (Last 24 hours) at 10/20/2017 0029 Last data filed at 10/19/2017 2016 Gross per 24 hour  Intake -  Output 1550 ml  Net -1550 ml   Filed Weights   11/02/2017 1128 11/11/2017 1130 10/18/2017 1800  Weight: 90.7 kg 89.8 kg 88.3 kg   Body mass index is 29.6 kg/m.   General: Overweight caucasian male, diaphoretic, uncomfortable appearing  Head: Normocephalic, atraumatic, sclera non-icteric, no xanthomas, nares are without discharge.  Neck: Negative for carotid bruits. JVD not elevated. Lungs: Bilateral wheezing. Breathing is unlabored. Heart: RRR with S1 S2. No murmurs, rubs, or gallops appreciated. Abdomen: Soft, non-tender,  non-distended with normoactive bowel sounds. No hepatomegaly. No rebound/guarding. No obvious abdominal masses. Msk:  Strength and tone appear normal for age. Extremities: No clubbing or cyanosis. 1+ symmetric edema.  Distal pedal pulses are 2+ and equal bilaterally. Neuro: Alert and oriented X 3. No facial asymmetry. No focal deficit. Moves all extremities spontaneously. Psych:  Responds to questions appropriately with a normal affect.  EKG:  The EKG was personally reviewed and demonstrates ST elevation in lead I and aVL.  Relevant CV Studies: Personally reviewed echocardiogram performed 01/16/2017. Normal LV ejection fraction. Aortic sclerosis without stenosis.   Normal nuclear stress test 01/28/2014.  Personally was present for coronary angiography performed by Dr. Claiborne Billings. This was performed via right radial access. The right coronary artery was large dominant vessel without any significant stenosis. There is 70% stenosis in the distal left main coronary artery with haziness suggestive of possible plaque rupture.   Laboratory Data:  Chemistry Recent Labs  Lab 11/13/2017 1144  NA 135  K 3.9  CL 95*  CO2 26  GLUCOSE 249*  BUN 18  CREATININE 0.98  CALCIUM 9.3  GFRNONAA >60  GFRAA >60  ANIONGAP 14    No results for input(s): PROT, ALBUMIN, AST, ALT, ALKPHOS, BILITOT in the last 168 hours. Hematology Recent Labs  Lab 10/15/2017 1144  WBC 10.8*  RBC 4.98  HGB 14.7  HCT 46.4  MCV 93.2  MCH 29.5  MCHC 31.7  RDW 13.7  PLT 234   Cardiac Enzymes Recent Labs  Lab 10/21/2017 1144  TROPONINI <0.03   No results for input(s): TROPIPOC in the last 168 hours.  BNP Recent Labs  Lab 11/08/2017 1144  BNP 729.7*    DDimer No results for input(s): DDIMER in the last 168 hours.  Radiology/Studies:  Dg Chest Portable 1 View  Result Date: 11/11/2017 CLINICAL DATA:  Shortness of breath and cough for 2 days, history of atrial fibrillation, hypertension, abdominal aortic aneurysm repair,  diabetes mellitus EXAM: PORTABLE CHEST 1 VIEW COMPARISON:  Portable exam 1153 hours compared to 01/02/2017 FINDINGS: Enlargement of cardiac silhouette. Mediastinal contours and pulmonary vascularity normal. Calcified tortuous thoracic aorta. Mild scarring RIGHT base. No acute infiltrate,  pleural effusion, or pneumothorax. Questionable new lytic lesion at the RIGHT humeral head versus artifact. IMPRESSION: RIGHT basilar scarring. No acute infiltrate period. Questionable lytic lesion at the RIGHT humeral head versus artifact; dedicated RIGHT shoulder radiographs recommended to assess. Electronically Signed   By: Lavonia Dana M.D.   On: 11/02/2017 12:15   Dg Shoulder Right Portable  Result Date: 11/03/2017 CLINICAL DATA:  Assess right shoulder for lytic lesion. EXAM: PORTABLE RIGHT SHOULDER COMPARISON:  None. FINDINGS: No discrete lytic lesion identified. AC joint degenerative changes noted. Degenerative changes. Calcific tendinosis at the rotator cuff tendon. No other acute abnormalities. IMPRESSION: No discrete lytic lesions seen in the right humeral head. Degenerative changes. Electronically Signed   By: Dorise Bullion III M.D   On: 10/22/2017 13:46    Assessment and Plan:  Charles Bailey is a 72 y.o. male with hypertension, hyperlipidemia, diabetes mellitus type 2, paroxysmal atrial fibrillation on rivaroxaban, abdominal aortic aneurysm status-post repair 2007, who is being seen today for the evaluation of ST segment elevations, diaphoresis, and chest discomfort at the request of Dr. Renetta Chalk.  1. ST segment elevations and chest pain. 2. Left main stenosis The patient presented with acute heart failure syndrome and subsequently developed chest pain and diaphoresis with ST elevations in lead I, aVL and diffuse ST depressions concerning for lateral STEMI versus global myocardial ischemia. He has multiple risk factors for CAD including peripheral vascular disease, hypertension, hyperlipidemia, type 2  diabetes mellitus, and smoking. Coronary angiography revealed normal right coronary artery and some disease in the distal left main with haziness. Presumably he had left main spasm with potential plaque rupture as well.  - Continue aspirin 81 mg po daily - IV UFH goal PTT 50-75 seconds.  - Hold P2Y12 - anticipate CTS consultation for LM lesion  - Continue atorvastatin 80 mg po qhs - Continue nitroglycerine infusion for SBP < 140 mmHg - Continue home clonidine 0.25 mg po BID - Continue home metoprolol tartrate 50 mg PO BID - Continue home amlodipine 10 mg po daily - Continue home lisinopril 40 mg po daily - Transthoracic echocardiogram - LDL, HgbA1c, TSH  2. Paroxysmal atrial fibrillation. He has a CHA2DS2VASc score of 4 for hypertension, age > 22, vascular disease, and diabetes mellitus. We will continue metoprolol tartrate as above for rate control. His oral anticoagulant, rivaroxaban, will be possibly restarted tomorrow.  3. Type 2 diabetes mellitus. His most recent hemoglobin A1c was 6.7% indicating reasonable glycemic control. We will hold his home oral hypoglycemic agents. No concentrated sweets diet. Accuchecks and sliding scale insulin ordered.   For questions or updates, please contact [place] Please consult [email or web address] for contact info under Cardiology/STEMI.    Signed, Perley Jain, MD  10/24/2017 12:29 AM

## 2017-10-31 NOTE — Progress Notes (Signed)
ANTICOAGULATION CONSULT NOTE - Follow-Up Consult  Pharmacy Consult for heparin Indication: atrial fibrillation and CAD awaiting CABG consult  No Known Allergies  Patient Measurements: Height: 5\' 8"  (172.7 cm) Weight: 188 lb 4.4 oz (85.4 kg) IBW/kg (Calculated) : 68.4  Vital Signs: Temp: 98.7 F (37.1 C) (10/18 1613) Temp Source: Oral (10/18 1613) BP: 107/84 (10/18 1700) Pulse Rate: 61 (10/18 1700)  Labs: Recent Labs    11/10/2017 1144 11/05/2017 2330 10/26/2017 0626 11/06/2017 1406 11/01/2017 1608  HGB 14.7  --  11.7*  --   --   HCT 46.4  --  37.0*  --   --   PLT 234  --  184  --   --   APTT  --   --  37*  --  83*  HEPARINUNFRC  --   --  0.75*  --   --   CREATININE 0.98  --  0.91  --   --   TROPONINI <0.03 <0.03 1.11* 0.96*  --     Estimated Creatinine Clearance: 80.3 mL/min (by C-G formula based on SCr of 0.91 mg/dL).   Medical History: Past Medical History:  Diagnosis Date  . A-fib ([place])   . Adenomatous colon polyp 2003  . CHF (congestive heart failure) ([place])   . Diabetes mellitus   . Hyperlipidemia   . Hypertension     Assessment: 72yo male presents w/ respiratory distress, admitted for CHF exacerbation, on unit pt because diaphoretic and c/o CP >> EKG suggested acute MI and code STEMI called, sent emergently to cardiac cath lab where left main was found to be stenosed, to begin heparin at 0600 while awaiting CABG consult; pt is on chronic Xarelto therapy for Afib w/ last dose taken 10/16 pm.  Initial aPTT therapeutic at 83 seconds.  Goal of Therapy:  Heparin level 0.3-0.7 units/ml aPTT 66-102 seconds Monitor platelets by anticoagulation protocol: Yes   Plan:  -Continue IV heparin 1250 units/hr -Recheck aPTT/heparin level with morning labs  Arrie Senate, PharmD, BCPS Clinical Pharmacist [phone] Please check AMION for all [facility] numbers 10/17/2017

## 2017-10-31 NOTE — Plan of Care (Signed)
  Problem: Education: Goal: Knowledge of General Education information will improve Description: Including pain rating scale, medication(s)/side effects and non-pharmacologic comfort measures Outcome: Progressing   Problem: Health Behavior/Discharge Planning: Goal: Ability to manage health-related needs will improve Outcome: Progressing   Problem: Clinical Measurements: Goal: Ability to maintain clinical measurements within normal limits will improve Outcome: Progressing Goal: Respiratory complications will improve Outcome: Progressing Goal: Cardiovascular complication will be avoided Outcome: Progressing   Problem: Activity: Goal: Risk for activity intolerance will decrease Outcome: Progressing   Problem: Nutrition: Goal: Adequate nutrition will be maintained Outcome: Progressing   Problem: Coping: Goal: Level of anxiety will decrease Outcome: Progressing   

## 2017-10-31 NOTE — Progress Notes (Signed)
CRITICAL VALUE ALERT  Critical Value:  Troponin 0.96  Date & Time Notied:  11/13/2017 1600  Provider Notified: Gwenlyn Found   Orders Received/Actions taken: None Received/ continue to monitor pt

## 2017-10-31 NOTE — Progress Notes (Signed)
  Echocardiogram 2D Echocardiogram has been performed.  Madelaine Etienne 11/01/2017, 10:03 AM

## 2017-10-31 NOTE — Progress Notes (Addendum)
Progress Note  Patient Name: Charles Bailey Date of Encounter: 10/15/2017  Primary Cardiologist: No primary care provider on file.   Subjective   Feeling better this morning. Denies chest pain and shortness of breath. Mild HA from  IV nitro.   Inpatient Medications    Scheduled Meds: . amLODipine  10 mg Oral Daily  . aspirin  81 mg Oral Daily  . atorvastatin  80 mg Oral q1800  . cloNIDine  0.2 mg Oral BID  . furosemide  40 mg Intravenous Once  . insulin aspart  0-9 Units Subcutaneous TID WC  . lisinopril  40 mg Oral Daily  . metoprolol tartrate  50 mg Oral BID  . nitroGLYCERIN      . omega-3 acid ethyl esters  1,000 mg Oral Daily  . sodium chloride flush  3 mL Intravenous Q12H   Continuous Infusions: . sodium chloride    . heparin 1,250 Units/hr (10/30/2017 0800)  . nitroGLYCERIN 50 mcg/min (10/17/2017 0600)   PRN Meds: sodium chloride, acetaminophen, diazepam, ondansetron **OR** ondansetron (ZOFRAN) IV, ondansetron (ZOFRAN) IV, sodium chloride flush   Vital Signs    Vitals:   10/18/2017 0600 10/21/2017 0630 10/21/2017 0732 11/09/2017 0800  BP: 125/89 (!) 151/82  139/84  Pulse: (!) 59 78  78  Resp: 15 (!) 22  (!) 21  Temp:   98 F (36.7 C)   TempSrc:   Oral   SpO2: 96% 95%  96%  Weight:      Height:        Intake/Output Summary (Last 24 hours) at 10/30/2017 0824 Last data filed at 10/14/2017 0800 Gross per 24 hour  Intake 697.98 ml  Output 1850 ml  Net -1152.02 ml   Filed Weights   11/11/2017 1130 10/19/2017 1800 11/13/2017 0500  Weight: 89.8 kg 88.3 kg 85.4 kg    Telemetry    Atrial fibrillation  - Personally Reviewed  ECG    Atrial fibrillation  - Personally Reviewed  Physical Exam   GEN: well-appearing elderly male, in No acute distress.   Neck: No JVD Cardiac: IRIR, no murmurs, rubs, or gallops.  Respiratory: diffuse expiratory wheezes  GI: Soft, nontender, non-distended  MS: No edema; No deformity. Neuro:  no focal deficits noted  Psych: Normal  affect   Labs    Chemistry Recent Labs  Lab 10/14/2017 1144 10/21/2017 2330 11/03/2017 0626  NA 135  --  138  K 3.9  --  3.6  CL 95*  --  103  CO2 26  --  26  GLUCOSE 249*  --  208*  BUN 18  --  15  CREATININE 0.98  --  0.91  CALCIUM 9.3  --  7.7*  PROT  --  7.1  --   ALBUMIN  --  3.9  --   AST  --  17  --   ALT  --  15  --   ALKPHOS  --  66  --   BILITOT  --  0.5  --   GFRNONAA >60  --  >60  GFRAA >60  --  >60  ANIONGAP 14  --  9     Hematology Recent Labs  Lab 11/11/2017 1144 10/31/17 0626  WBC 10.8* 7.0  RBC 4.98 4.04*  HGB 14.7 11.7*  HCT 46.4 37.0*  MCV 93.2 91.6  MCH 29.5 29.0  MCHC 31.7 31.6  RDW 13.7 13.6  PLT 234 184    Cardiac Enzymes Recent Labs  Lab 10/31/2017 1144 10/19/2017  2330 10/25/2017 0626  TROPONINI <0.03 <0.03 1.11*   No results for input(s): TROPIPOC in the last 168 hours.   BNP Recent Labs  Lab 11/03/2017 1144  BNP 729.7*     DDimer No results for input(s): DDIMER in the last 168 hours.   Radiology    Dg Chest Portable 1 View  Result Date: 10/25/2017 CLINICAL DATA:  Shortness of breath and cough for 2 days, history of atrial fibrillation, hypertension, abdominal aortic aneurysm repair, diabetes mellitus EXAM: PORTABLE CHEST 1 VIEW COMPARISON:  Portable exam 1153 hours compared to 01/02/2017 FINDINGS: Enlargement of cardiac silhouette. Mediastinal contours and pulmonary vascularity normal. Calcified tortuous thoracic aorta. Mild scarring RIGHT base. No acute infiltrate, pleural effusion, or pneumothorax. Questionable new lytic lesion at the RIGHT humeral head versus artifact. IMPRESSION: RIGHT basilar scarring. No acute infiltrate period. Questionable lytic lesion at the RIGHT humeral head versus artifact; dedicated RIGHT shoulder radiographs recommended to assess. Electronically Signed   By: Lavonia Dana M.D.   On: 11/10/2017 12:15   Dg Shoulder Right Portable  Result Date: 10/29/2017 CLINICAL DATA:  Assess right shoulder for lytic  lesion. EXAM: PORTABLE RIGHT SHOULDER COMPARISON:  None. FINDINGS: No discrete lytic lesion identified. AC joint degenerative changes noted. Degenerative changes. Calcific tendinosis at the rotator cuff tendon. No other acute abnormalities. IMPRESSION: No discrete lytic lesions seen in the right humeral head. Degenerative changes. Electronically Signed   By: Dorise Bullion III M.D   On: 10/18/2017 13:46    Cardiac Studies   Normal nuclear stress test 01/28/2014.  Echocardiogram 10/22/2017: Study Conclusions - Left ventricle: The cavity size was normal. Wall thickness was   increased in a pattern of mild LVH. Systolic function was mildly   reduced. The estimated ejection fraction was in the range of 45%   to 50%. Anteroseptal hypokinesis. The study is not technically   sufficient to allow evaluation of LV diastolic function. - Aortic valve: Sclerosis without stenosis. There was mild   regurgitation. - Mitral valve: Mildly thickened leaflets . There was mild   regurgitation. - Left atrium: Severely dilated. - Right ventricle: The cavity size was mildly dilated. Mildly   reduced systolic function. - Right atrium: Moderately dilated. - Atrial septum: No defect or patent foramen ovale was identified   by color doppler and saline microbubble contrast. - Tricuspid valve: There was mild regurgitation. - Pulmonary arteries: PA peak pressure: 48 mm Hg (S). - Inferior vena cava: The vessel was normal in size. The   respirophasic diameter changes were in the normal range (>= 50%),   consistent with normal central venous pressure.  Impressions: - Compared to a prior study in 01/2017, the LVEF is lower at 45-50%   with anteroseptal hypokinesis. Negative for right to left shunt   by bubble study.    Mid LM to Dist LM lesion is 70% stenosed.  LV end diastolic pressure is normal.   Smooth 70% mid left main stenosis with mild haziness which did not improve following intracoronary  nitroglycerin.  Otherwise normal-appearing LAD, left circumflex, and dominant RCA.  With dynamic marked ECG changes and up to 3 to 4 mm of ST-T segment elevation transiently in lead aVR with diffuse global ischemic changes, suspect significant transient vasospasm attributing to his marked ECG abnormalities.  Low normal global LV function with an ejection fraction of 50% and mild hypocontractility in the mid distal anterolateral wall most likely due to transient stunned myocardium from transient left main coronary vasospasm. LVEDP 13 mm Hg  RECOMMENDATION: The patient's last dose of Xarelto was the evening of October 29, 2016.  We will continue IV nitroglycerin at current rate 50 mcg.  Plan to heparinize patient for 4 - 5 hours post sheath removal.  ECG post procedure shows complete resolution of prior dynamic ECG changes.  Recommend surgical consultation for consideration of CABG revascularization.  Recommend Aspirin 81mg  daily for CAD and will not institute P2Y12 inhibition due to need for probable CABG revascularization.    Diagnostic Diagram         Patient Profile     72 y.o. male hypertension, hyperlipidemia, diabetes mellitus type 2, paroxysmal atrial fibrillation on rivaroxaban, abdominal aortic aneurysm status-post repair 2007, who is being seen today for the evaluation of ST segment elevations, diaphoresis, and chest discomfort at the request of Dr. Renetta Chalk  Assessment & Plan    1. Acute STEMI: Likely secondary to vasospasms in the setting of left main disease with 70% stenosis seen on LHC yesterday. No PCI intervention as he has surgical anatomy and is a candidate for CABG.  He is chest pain-free today and doing well.  We will continue heparin and nitro drip and consult to CTS for CABG.   2. Acute HFrEF: Depressed EF on echocardiogram performed 45-50% with anteroseptal hypokinesis. He does appear hypervolemic on exam.  IV Lasix 401 today. Will reassess volume status tomorrow.  We will continue to monitor electrolytes renal function.  2. Hypertension: Well controlled on metoprolol, lisinopril, amlodipine, and clonidine.  3. Hyperlipidemia: Continue high intensity statin.   4. PAF: CHA2DS2VASc score of 4. On Xarelto at home, last dose 10/16. Will hold DOAC and continue heparin gtt in anticipation for surgery next week.   5. T2DM: Well controlled. On SSI.   For questions or updates, please contact [place] Please consult [email or web address] for contact info under        Signed, Welford Roche, MD  10/16/2017, 8:24 AM    Agree with note by Dr. Frederico Hamman  Mr. Dalziel was admitted with unstable angina.  He does have a history of PAF on Xarelto.  Because of EKG changes of ST segment elevation in leads I and L and anterior precordial leads he underwent radial diagnostic cath by Dr. Claiborne Billings revealing a 70% hypodense distal left main stenosis.  The remainder of his coronary anatomy is free of significant disease.  Intracoronary nitroglycerin was administered which did not change the characteristics of the lesion suggesting that it was not spasm.  His LV function was low normal.  I discussed the case with Dr. Darcey Nora for consideration of CABG.  His exam is benign.  He does have decreased breath sounds at the bases and 1-2+ pitting edema.  We will give him 40 mg of IV Lasix and we will begin daily furosemide.  Lorretta Harp, M.D., [place], [facility], [place] [address] [place], [place]  [address]  [phone] 11/03/2017 11:21 AM

## 2017-10-31 NOTE — Progress Notes (Addendum)
ANTICOAGULATION CONSULT NOTE - Initial Consult  Pharmacy Consult for heparin Indication: atrial fibrillation and CAD awaiting CABG consult  No Known Allergies  Patient Measurements: Height: 5\' 8"  (172.7 cm) Weight: 194 lb 10.7 oz (88.3 kg) IBW/kg (Calculated) : 68.4  Vital Signs: Temp: 97.7 F (36.5 C) (10/17 2000) Temp Source: Axillary (10/17 2000) BP: 125/82 (10/18 0201) Pulse Rate: 0 (10/18 0206)  Labs: Recent Labs    11/13/2017 1144 11/11/2017 2330  HGB 14.7  --   HCT 46.4  --   PLT 234  --   CREATININE 0.98  --   TROPONINI <0.03 <0.03    Estimated Creatinine Clearance: 75.8 mL/min (by C-G formula based on SCr of 0.98 mg/dL).   Medical History: Past Medical History:  Diagnosis Date  . A-fib ([place])   . Adenomatous colon polyp 2003  . CHF (congestive heart failure) ([place])   . Diabetes mellitus   . Hyperlipidemia   . Hypertension     Assessment: 72yo male presents w/ respiratory distress, admitted for CHF exacerbation, on unit pt because diaphoretic and c/o CP >> EKG suggested acute MI and code STEMI called, sent emergently to cardiac cath lab where left main was found to be stenosed, to begin heparin at 0600 while awaiting CABG consult; pt is on chronic Xarelto therapy for Afib w/ last dose taken 10/16 pm.  Goal of Therapy:  Heparin level 0.3-0.7 units/ml aPTT 66-102 seconds Monitor platelets by anticoagulation protocol: Yes   Plan:  At 0600 this am will start heparin gtt at 1250 units/hr and monitor heparin levels, aPTT (while Xarelto affects anti-Xa), and CBC.  Wynona Neat, PharmD, BCPS  11/03/2017,2:37 AM

## 2017-11-01 DIAGNOSIS — I5041 Acute combined systolic (congestive) and diastolic (congestive) heart failure: Secondary | ICD-10-CM

## 2017-11-01 LAB — CBC
HCT: 40 % (ref 39.0–52.0)
HEMOGLOBIN: 12.8 g/dL — AB (ref 13.0–17.0)
MCH: 29.3 pg (ref 26.0–34.0)
MCHC: 32 g/dL (ref 30.0–36.0)
MCV: 91.5 fL (ref 80.0–100.0)
NRBC: 0 % (ref 0.0–0.2)
Platelets: 200 10*3/uL (ref 150–400)
RBC: 4.37 MIL/uL (ref 4.22–5.81)
RDW: 13.5 % (ref 11.5–15.5)
WBC: 7.1 10*3/uL (ref 4.0–10.5)

## 2017-11-01 LAB — GLUCOSE, CAPILLARY
GLUCOSE-CAPILLARY: 156 mg/dL — AB (ref 70–99)
GLUCOSE-CAPILLARY: 184 mg/dL — AB (ref 70–99)
Glucose-Capillary: 172 mg/dL — ABNORMAL HIGH (ref 70–99)
Glucose-Capillary: 150 mg/dL — ABNORMAL HIGH (ref 70–99)

## 2017-11-01 LAB — BASIC METABOLIC PANEL
ANION GAP: 11 (ref 5–15)
BUN: 20 mg/dL (ref 8–23)
CALCIUM: 8.8 mg/dL — AB (ref 8.9–10.3)
CO2: 29 mmol/L (ref 22–32)
Chloride: 99 mmol/L (ref 98–111)
Creatinine, Ser: 1.09 mg/dL (ref 0.61–1.24)
GFR calc non Af Amer: >60 mL/min (ref >60)
Glucose, Bld: 178 mg/dL — ABNORMAL HIGH (ref 70–99)
Potassium: 3.8 mmol/L (ref 3.5–5.1)
Sodium: 139 mmol/L (ref 135–145)

## 2017-11-01 LAB — APTT: aPTT: 102 seconds — ABNORMAL HIGH (ref 24–36)

## 2017-11-01 LAB — HEPARIN LEVEL (UNFRACTIONATED): Heparin Unfractionated: 0.96 IU/mL — ABNORMAL HIGH (ref 0.30–0.70)

## 2017-11-01 LAB — TROPONIN I: TROPONIN I: 0.38 ng/mL — AB (ref <0.03)

## 2017-11-01 LAB — MAGNESIUM: Magnesium: 1.7 mg/dL (ref 1.7–2.4)

## 2017-11-01 MED ORDER — WHITE PETROLATUM EX OINT
TOPICAL_OINTMENT | CUTANEOUS | Status: DC | PRN
  Filled 2017-11-01: qty 28.35

## 2017-11-01 MED ORDER — METOPROLOL SUCCINATE ER 50 MG PO TB24
75.0000 mg | ORAL_TABLET | Freq: Every day | ORAL | Status: DC
  Administered 2017-11-01 – 2017-11-02 (×2): 75 mg via ORAL
  Filled 2017-11-01 (×2): qty 1

## 2017-11-01 MED ORDER — SALINE SPRAY 0.65 % NA SOLN
1.0000 | NASAL | Status: DC | PRN
  Filled 2017-11-01: qty 44

## 2017-11-01 NOTE — Progress Notes (Signed)
ANTICOAGULATION CONSULT NOTE - Follow-Up Consult  Pharmacy Consult for heparin Indication: atrial fibrillation and CAD awaiting CABG consult  No Known Allergies  Patient Measurements: Height: 5\' 8"  (172.7 cm) Weight: 188 lb 4.4 oz (85.4 kg) IBW/kg (Calculated) : 68.4  Vital Signs: Temp: 98.1 F (36.7 C) (10/19 0712) Temp Source: Oral (10/19 0712) BP: 123/82 (10/19 0700) Pulse Rate: 54 (10/19 0700)  Labs: Recent Labs    11/09/2017 1144  10/30/2017 0626 11/03/2017 1406 10/26/2017 1608 11/13/2017 1937 11/01/17 0019 11/01/17 0301  HGB 14.7  --  11.7*  --   --   --   --  12.8*  HCT 46.4  --  37.0*  --   --   --   --  40.0  PLT 234  --  184  --   --   --   --  200  APTT  --   --  37*  --  83*  --   --  102*  HEPARINUNFRC  --   --  0.75*  --   --   --   --  0.96*  CREATININE 0.98  --  0.91  --   --   --   --  1.09  TROPONINI <0.03   < > 1.11* 0.96*  --  0.62* 0.38*  --    < > = values in this interval not displayed.    Estimated Creatinine Clearance: 67.1 mL/min (by C-G formula based on SCr of 1.09 mg/dL).   Medical History: Past Medical History:  Diagnosis Date  . A-fib ([place])   . Adenomatous colon polyp 2003  . CHF (congestive heart failure) ([place])   . Diabetes mellitus   . Hyperlipidemia   . Hypertension     Assessment: 72yo male presents w/ respiratory distress, admitted for CHF exacerbation, on unit pt because diaphoretic and c/o CP >> EKG suggested acute MI and code STEMI called, sent emergently to cardiac cath lab where left main was found to be stenosed > TCTS consult plan CABG next week.   Heparin drip started 1250 uts/hr aptt 102 at top of range Pt is on chronic Xarelto therapy for Afib w/ last dose taken 10/16 pm - this falsely elevates HL - will use aptt to dose heparin for now.   CBC ok. Slight nose bleed - stopped easily overnight - RN to add humidifier to [place] O2 and will add ocean nasal spray and vaseline as needed.    Goal of Therapy:  Heparin level 0.3-0.7  units/ml aPTT 66-102 seconds Monitor platelets by anticoagulation protocol: Yes   Plan:  Decrease IV heparin 1200 units/hr Daily HL, APTT, CBC  Monitor s/s bleeding  Bonnita Nasuti Pharm.D. CPP, BCPS Clinical Pharmacist [phone] 11/01/2017 7:58 AM

## 2017-11-01 NOTE — Progress Notes (Addendum)
Patient ID: Charles Bailey, male   DOB: 1945/08/29, 72 y.o.   MRN: [id number]   Progress Note  Patient Name: Charles Bailey Date of Encounter: 11/01/2017  Primary Cardiologist: No primary care provider on file.   Subjective   IV NTG was stopped yesterday evening and he had recurrent chest pain.  This resolved when NTG gtt was restarted and he has had no further pain since then.  Feels fine this morning without dyspnea or chest pain.    Inpatient Medications    Scheduled Meds: . amLODipine  10 mg Oral Daily  . aspirin  81 mg Oral Daily  . atorvastatin  80 mg Oral q1800  . cloNIDine  0.2 mg Oral BID  . furosemide  40 mg Intravenous Once  . insulin aspart  0-9 Units Subcutaneous TID WC  . lisinopril  40 mg Oral Daily  . metoprolol succinate  75 mg Oral Daily  . omega-3 acid ethyl esters  1,000 mg Oral Daily  . sodium chloride flush  3 mL Intravenous Q12H   Continuous Infusions: . sodium chloride    . heparin 1,250 Units/hr (11/01/17 0500)  . nitroGLYCERIN 50 mcg/min (11/01/17 0500)   PRN Meds: sodium chloride, acetaminophen, diazepam, ondansetron **OR** ondansetron (ZOFRAN) IV, ondansetron (ZOFRAN) IV, sodium chloride flush   Vital Signs    Vitals:   11/01/17 0500 11/01/17 0600 11/01/17 0700 11/01/17 0712  BP: 138/79 (!) 147/68 123/82   Pulse: (!) 124 (!) 58 (!) 54   Resp: 20 16 (!) 22   Temp:    98.1 F (36.7 C)  TempSrc:    Oral  SpO2: 93% 97% 97%   Weight:      Height:        Intake/Output Summary (Last 24 hours) at 11/01/2017 0752 Last data filed at 11/01/2017 0500 Gross per 24 hour  Intake 1527.45 ml  Output 1025 ml  Net 502.45 ml   Filed Weights   11/05/2017 1130 11/10/2017 1800 10/20/2017 0500  Weight: 89.8 kg 88.3 kg 85.4 kg    Telemetry    Atrial fibrillation in 60s  - Personally Reviewed   Physical Exam   General: NAD Neck: JVP 8 cm, no thyromegaly or thyroid nodule.  Lungs: Clear to auscultation bilaterally with normal respiratory effort. CV:  Nondisplaced PMI.  Heart regular S1/S2, no S3/S4, no murmur. Trace ankle edema.   Abdomen: Soft, nontender, no hepatosplenomegaly, no distention.  Skin: Intact without lesions or rashes.  Neurologic: Alert and oriented x 3.  Psych: Normal affect. Extremities: No clubbing or cyanosis.  HEENT: Normal.    Labs    Chemistry Recent Labs  Lab 11/03/2017 1144 10/14/2017 2330 10/19/2017 0626 11/01/17 0301  NA 135  --  138 139  K 3.9  --  3.6 3.8  CL 95*  --  103 99  CO2 26  --  26 29  GLUCOSE 249*  --  208* 178*  BUN 18  --  15 20  CREATININE 0.98  --  0.91 1.09  CALCIUM 9.3  --  7.7* 8.8*  PROT  --  7.1  --   --   ALBUMIN  --  3.9  --   --   AST  --  17  --   --   ALT  --  15  --   --   ALKPHOS  --  66  --   --   BILITOT  --  0.5  --   --   GFRNONAA >60  --  >  60 >60  GFRAA >60  --  >60 >60  ANIONGAP 14  --  9 11     Hematology Recent Labs  Lab 10/15/2017 1144 11/06/2017 0626 11/01/17 0301  WBC 10.8* 7.0 7.1  RBC 4.98 4.04* 4.37  HGB 14.7 11.7* 12.8*  HCT 46.4 37.0* 40.0  MCV 93.2 91.6 91.5  MCH 29.5 29.0 29.3  MCHC 31.7 31.6 32.0  RDW 13.7 13.6 13.5  PLT 234 184 200    Cardiac Enzymes Recent Labs  Lab 10/19/2017 0626 11/13/2017 1406 11/03/2017 1937 11/01/17 0019  TROPONINI 1.11* 0.96* 0.62* 0.38*   No results for input(s): TROPIPOC in the last 168 hours.   BNP Recent Labs  Lab 10/25/2017 1144  BNP 729.7*     DDimer No results for input(s): DDIMER in the last 168 hours.   Radiology    Dg Chest Portable 1 View  Result Date: 11/05/2017 CLINICAL DATA:  Shortness of breath and cough for 2 days, history of atrial fibrillation, hypertension, abdominal aortic aneurysm repair, diabetes mellitus EXAM: PORTABLE CHEST 1 VIEW COMPARISON:  Portable exam 1153 hours compared to 01/02/2017 FINDINGS: Enlargement of cardiac silhouette. Mediastinal contours and pulmonary vascularity normal. Calcified tortuous thoracic aorta. Mild scarring RIGHT base. No acute infiltrate, pleural  effusion, or pneumothorax. Questionable new lytic lesion at the RIGHT humeral head versus artifact. IMPRESSION: RIGHT basilar scarring. No acute infiltrate period. Questionable lytic lesion at the RIGHT humeral head versus artifact; dedicated RIGHT shoulder radiographs recommended to assess. Electronically Signed   By: Lavonia Dana M.D.   On: 11/13/2017 12:15   Dg Shoulder Right Portable  Result Date: 10/24/2017 CLINICAL DATA:  Assess right shoulder for lytic lesion. EXAM: PORTABLE RIGHT SHOULDER COMPARISON:  None. FINDINGS: No discrete lytic lesion identified. AC joint degenerative changes noted. Degenerative changes. Calcific tendinosis at the rotator cuff tendon. No other acute abnormalities. IMPRESSION: No discrete lytic lesions seen in the right humeral head. Degenerative changes. Electronically Signed   By: Dorise Bullion III M.D   On: 11/02/2017 13:46    Cardiac Studies   Normal nuclear stress test 01/28/2014.  Echocardiogram 11/03/2017: Study Conclusions - Left ventricle: The cavity size was normal. Wall thickness was   increased in a pattern of mild LVH. Systolic function was mildly   reduced. The estimated ejection fraction was in the range of 45%   to 50%. Anteroseptal hypokinesis. The study is not technically   sufficient to allow evaluation of LV diastolic function. - Aortic valve: Sclerosis without stenosis. There was mild   regurgitation. - Mitral valve: Mildly thickened leaflets . There was mild   regurgitation. - Left atrium: Severely dilated. - Right ventricle: The cavity size was mildly dilated. Mildly   reduced systolic function. - Right atrium: Moderately dilated. - Atrial septum: No defect or patent foramen ovale was identified   by color doppler and saline microbubble contrast. - Tricuspid valve: There was mild regurgitation. - Pulmonary arteries: PA peak pressure: 48 mm Hg (S). - Inferior vena cava: The vessel was normal in size. The   respirophasic diameter  changes were in the normal range (>= 50%),   consistent with normal central venous pressure.  Impressions: - Compared to a prior study in 01/2017, the LVEF is lower at 45-50%   with anteroseptal hypokinesis. Negative for right to left shunt   by bubble study.  LHC:    Mid LM to Dist LM lesion is 70% stenosed.  LV end diastolic pressure is normal.   Smooth 70% mid left main  stenosis with mild haziness which did not improve following intracoronary nitroglycerin.  Otherwise normal-appearing LAD, left circumflex, and dominant RCA.  With dynamic marked ECG changes and up to 3 to 4 mm of ST-T segment elevation transiently in lead aVR with diffuse global ischemic changes, suspect significant transient vasospasm attributing to his marked ECG abnormalities.  Low normal global LV function with an ejection fraction of 50% and mild hypocontractility in the mid distal anterolateral wall most likely due to transient stunned myocardium from transient left main coronary vasospasm. LVEDP 13 mm Hg  RECOMMENDATION: The patient's last dose of Xarelto was the evening of October 29, 2016.  We will continue IV nitroglycerin at current rate 50 mcg.  Plan to heparinize patient for 4 - 5 hours post sheath removal.  ECG post procedure shows complete resolution of prior dynamic ECG changes.  Recommend surgical consultation for consideration of CABG revascularization.  Recommend Aspirin 81mg  daily for CAD and will not institute P2Y12 inhibition due to need for probable CABG revascularization.    Diagnostic Diagram         Patient Profile     72 y.o. male hypertension, hyperlipidemia, diabetes mellitus type 2, paroxysmal atrial fibrillation on rivaroxaban, abdominal aortic aneurysm status-post repair 2007 was admitted with NSTEMI.   Assessment & Plan    1. CAD:  NSTEMI with cath showing 70% hypodense distal LM stenosis.  The remainder of his coronary anatomy is free of significant disease.  Intracoronary  nitroglycerin was administered which did not change the characteristics of the lesion suggesting that it was not spasm.  Plan at this time is CABG on 10/23.  Of note, he had recurrent CP when NTG was stopped last night but no chest pain after going back on NTG.  - CABG 10/23.  - Continue NTG gtt.  - Continue statin, ASA, beta blocker.  - Heparin gtt.    2. Acute primarily diastolic CHF: Echo showed EF 45-50% with anteroseptal hypokinesis. Suspect mild volume excess on exam.  - He is written for Lasix 40 mg IV x 1 today.  Will give this dose and reassess in am.  - Continue lisinopril. - Stop metoprolol and start Toprol XL 75 mg daily.   2. Hypertension: Controlled on current regimen.   3. Hyperlipidemia: Continue high intensity statin.   4. Chronic atrial fibrillation: CHA2DS2VASc score of 4. On Xarelto at home, last dose 10/16. Will hold DOAC and continue heparin gtt in anticipation for surgery next week.  - Plan for [place] with CABG.   5. T2DM: Well controlled. On SSI.   For questions or updates, please contact [place] Please consult [email or web address] for contact info under   Signed, Loralie Champagne, MD  11/01/2017, 7:52 AM

## 2017-11-02 ENCOUNTER — Inpatient Hospital Stay (HOSPITAL_COMMUNITY): Payer: PPO | Admitting: Certified Registered"

## 2017-11-02 ENCOUNTER — Inpatient Hospital Stay (HOSPITAL_COMMUNITY): Payer: PPO

## 2017-11-02 ENCOUNTER — Encounter (HOSPITAL_COMMUNITY): Admission: EM | Disposition: E | Payer: Self-pay | Source: Home / Self Care | Attending: Cardiovascular Disease

## 2017-11-02 DIAGNOSIS — R570 Cardiogenic shock: Secondary | ICD-10-CM | POA: Diagnosis not present

## 2017-11-02 DIAGNOSIS — I469 Cardiac arrest, cause unspecified: Secondary | ICD-10-CM | POA: Diagnosis not present

## 2017-11-02 DIAGNOSIS — I4901 Ventricular fibrillation: Secondary | ICD-10-CM | POA: Diagnosis not present

## 2017-11-02 HISTORY — PX: CLIPPING OF ATRIAL APPENDAGE: SHX5773

## 2017-11-02 HISTORY — PX: TEE WITHOUT CARDIOVERSION: SHX5443

## 2017-11-02 HISTORY — PX: PLACEMENT OF IMPELLA LEFT VENTRICULAR ASSIST DEVICE: SHX6519

## 2017-11-02 HISTORY — PX: CORONARY ARTERY BYPASS GRAFT: SHX141

## 2017-11-02 LAB — POCT I-STAT, CHEM 8
BUN: 16 mg/dL (ref 8–23)
BUN: 19 mg/dL (ref 8–23)
BUN: 20 mg/dL (ref 8–23)
BUN: 18 mg/dL (ref 8–23)
BUN: 18 mg/dL (ref 8–23)
BUN: 18 mg/dL (ref 8–23)
BUN: 18 mg/dL (ref 8–23)
BUN: 23 mg/dL (ref 8–23)
BUN: 20 mg/dL (ref 8–23)
BUN: 22 mg/dL (ref 8–23)
CALCIUM ION: 0.94 mmol/L — AB (ref 1.15–1.40)
CALCIUM ION: 1.01 mmol/L — AB (ref 1.15–1.40)
CALCIUM ION: 1.23 mmol/L (ref 1.15–1.40)
CALCIUM ION: 1.13 mmol/L — AB (ref 1.15–1.40)
CALCIUM ION: 1.09 mmol/L — AB (ref 1.15–1.40)
CHLORIDE: 101 mmol/L (ref 98–111)
CHLORIDE: 106 mmol/L (ref 98–111)
CREATININE: 0.9 mg/dL (ref 0.61–1.24)
CREATININE: 0.9 mg/dL (ref 0.61–1.24)
CREATININE: 0.9 mg/dL (ref 0.61–1.24)
CREATININE: 1.4 mg/dL — AB (ref 0.61–1.24)
Calcium, Ion: 1.04 mmol/L — ABNORMAL LOW (ref 1.15–1.40)
Calcium, Ion: 0.96 mmol/L — ABNORMAL LOW (ref 1.15–1.40)
Calcium, Ion: 1.01 mmol/L — ABNORMAL LOW (ref 1.15–1.40)
Calcium, Ion: 1.12 mmol/L — ABNORMAL LOW (ref 1.15–1.40)
Calcium, Ion: 0.98 mmol/L — ABNORMAL LOW (ref 1.15–1.40)
Chloride: 94 mmol/L — ABNORMAL LOW (ref 98–111)
Chloride: 97 mmol/L — ABNORMAL LOW (ref 98–111)
Chloride: 97 mmol/L — ABNORMAL LOW (ref 98–111)
Chloride: 100 mmol/L (ref 98–111)
Chloride: 102 mmol/L (ref 98–111)
Chloride: 104 mmol/L (ref 98–111)
Chloride: 102 mmol/L (ref 98–111)
Chloride: 103 mmol/L (ref 98–111)
Creatinine, Ser: 0.9 mg/dL (ref 0.61–1.24)
Creatinine, Ser: 0.9 mg/dL (ref 0.61–1.24)
Creatinine, Ser: 1 mg/dL (ref 0.61–1.24)
Creatinine, Ser: 1 mg/dL (ref 0.61–1.24)
Creatinine, Ser: 1.1 mg/dL (ref 0.61–1.24)
Creatinine, Ser: 1.2 mg/dL (ref 0.61–1.24)
GLUCOSE: 417 mg/dL — AB (ref 70–99)
GLUCOSE: 273 mg/dL — AB (ref 70–99)
GLUCOSE: 301 mg/dL — AB (ref 70–99)
GLUCOSE: 301 mg/dL — AB (ref 70–99)
GLUCOSE: 315 mg/dL — AB (ref 70–99)
GLUCOSE: 178 mg/dL — AB (ref 70–99)
Glucose, Bld: 339 mg/dL — ABNORMAL HIGH (ref 70–99)
Glucose, Bld: 257 mg/dL — ABNORMAL HIGH (ref 70–99)
Glucose, Bld: 212 mg/dL — ABNORMAL HIGH (ref 70–99)
Glucose, Bld: 190 mg/dL — ABNORMAL HIGH (ref 70–99)
HCT: 26 % — ABNORMAL LOW (ref 39.0–52.0)
HCT: 30 % — ABNORMAL LOW (ref 39.0–52.0)
HCT: 19 % — ABNORMAL LOW (ref 39.0–52.0)
HCT: 23 % — ABNORMAL LOW (ref 39.0–52.0)
HCT: 33 % — ABNORMAL LOW (ref 39.0–52.0)
HCT: 27 % — ABNORMAL LOW (ref 39.0–52.0)
HEMATOCRIT: 31 % — AB (ref 39.0–52.0)
HEMATOCRIT: 21 % — AB (ref 39.0–52.0)
HEMATOCRIT: 24 % — AB (ref 39.0–52.0)
HEMATOCRIT: 30 % — AB (ref 39.0–52.0)
HEMOGLOBIN: 10.5 g/dL — AB (ref 13.0–17.0)
HEMOGLOBIN: 10.2 g/dL — AB (ref 13.0–17.0)
HEMOGLOBIN: 8.8 g/dL — AB (ref 13.0–17.0)
HEMOGLOBIN: 6.5 g/dL — AB (ref 13.0–17.0)
HEMOGLOBIN: 7.8 g/dL — AB (ref 13.0–17.0)
HEMOGLOBIN: 9.2 g/dL — AB (ref 13.0–17.0)
Hemoglobin: 7.1 g/dL — ABNORMAL LOW (ref 13.0–17.0)
Hemoglobin: 8.2 g/dL — ABNORMAL LOW (ref 13.0–17.0)
Hemoglobin: 11.2 g/dL — ABNORMAL LOW (ref 13.0–17.0)
Hemoglobin: 10.2 g/dL — ABNORMAL LOW (ref 13.0–17.0)
POTASSIUM: 3.8 mmol/L (ref 3.5–5.1)
POTASSIUM: 4.1 mmol/L (ref 3.5–5.1)
POTASSIUM: 4.3 mmol/L (ref 3.5–5.1)
POTASSIUM: 4.4 mmol/L (ref 3.5–5.1)
POTASSIUM: 4.7 mmol/L (ref 3.5–5.1)
POTASSIUM: 3.1 mmol/L — AB (ref 3.5–5.1)
POTASSIUM: 3.9 mmol/L (ref 3.5–5.1)
Potassium: 4.2 mmol/L (ref 3.5–5.1)
Potassium: 4.9 mmol/L (ref 3.5–5.1)
Potassium: 3.9 mmol/L (ref 3.5–5.1)
SODIUM: 140 mmol/L (ref 135–145)
SODIUM: 140 mmol/L (ref 135–145)
SODIUM: 143 mmol/L (ref 135–145)
SODIUM: 146 mmol/L — AB (ref 135–145)
Sodium: 133 mmol/L — ABNORMAL LOW (ref 135–145)
Sodium: 135 mmol/L (ref 135–145)
Sodium: 138 mmol/L (ref 135–145)
Sodium: 141 mmol/L (ref 135–145)
Sodium: 140 mmol/L (ref 135–145)
Sodium: 141 mmol/L (ref 135–145)
TCO2: 22 mmol/L (ref 22–32)
TCO2: 28 mmol/L (ref 22–32)
TCO2: 30 mmol/L (ref 22–32)
TCO2: 21 mmol/L — AB (ref 22–32)
TCO2: 22 mmol/L (ref 22–32)
TCO2: 23 mmol/L (ref 22–32)
TCO2: 25 mmol/L (ref 22–32)
TCO2: 26 mmol/L (ref 22–32)
TCO2: 29 mmol/L (ref 22–32)
TCO2: 27 mmol/L (ref 22–32)

## 2017-11-02 LAB — POCT I-STAT 3, ART BLOOD GAS (G3+)
ACID-BASE DEFICIT: 9 mmol/L — AB (ref 0.0–2.0)
ACID-BASE DEFICIT: 1 mmol/L (ref 0.0–2.0)
ACID-BASE EXCESS: 2 mmol/L (ref 0.0–2.0)
ACID-BASE EXCESS: 1 mmol/L (ref 0.0–2.0)
Acid-Base Excess: 5 mmol/L — ABNORMAL HIGH (ref 0.0–2.0)
Acid-Base Excess: 2 mmol/L (ref 0.0–2.0)
Acid-base deficit: 3 mmol/L — ABNORMAL HIGH (ref 0.0–2.0)
BICARBONATE: 23.4 mmol/L (ref 20.0–28.0)
BICARBONATE: 28.1 mmol/L — AB (ref 20.0–28.0)
BICARBONATE: 29.5 mmol/L — AB (ref 20.0–28.0)
Bicarbonate: 29.3 mmol/L — ABNORMAL HIGH (ref 20.0–28.0)
Bicarbonate: 19.7 mmol/L — ABNORMAL LOW (ref 20.0–28.0)
Bicarbonate: 21.4 mmol/L (ref 20.0–28.0)
Bicarbonate: 25.9 mmol/L (ref 20.0–28.0)
Bicarbonate: 26.8 mmol/L (ref 20.0–28.0)
O2 SAT: 100 %
O2 SAT: 100 %
O2 SAT: 97 %
O2 SAT: 99 %
O2 SAT: 99 %
O2 Saturation: 100 %
O2 Saturation: 100 %
O2 Saturation: 100 %
PCO2 ART: 32.4 mmHg (ref 32.0–48.0)
PCO2 ART: 35.4 mmHg (ref 32.0–48.0)
PH ART: 7.135 — AB (ref 7.350–7.450)
PH ART: 7.428 (ref 7.350–7.450)
PH ART: 7.428 (ref 7.350–7.450)
PO2 ART: 371 mmHg — AB (ref 83.0–108.0)
PO2 ART: 150 mmHg — AB (ref 83.0–108.0)
TCO2: 31 mmol/L (ref 22–32)
TCO2: 21 mmol/L — ABNORMAL LOW (ref 22–32)
TCO2: 22 mmol/L (ref 22–32)
TCO2: 24 mmol/L (ref 22–32)
TCO2: 30 mmol/L (ref 22–32)
TCO2: 31 mmol/L (ref 22–32)
TCO2: 27 mmol/L (ref 22–32)
TCO2: 28 mmol/L (ref 22–32)
pCO2 arterial: 43.2 mmHg (ref 32.0–48.0)
pCO2 arterial: 58.4 mmHg — ABNORMAL HIGH (ref 32.0–48.0)
pCO2 arterial: 50.7 mmHg — ABNORMAL HIGH (ref 32.0–48.0)
pCO2 arterial: 62.5 mmHg — ABNORMAL HIGH (ref 32.0–48.0)
pCO2 arterial: 48.4 mmHg — ABNORMAL HIGH (ref 32.0–48.0)
pCO2 arterial: 44.9 mmHg (ref 32.0–48.0)
pH, Arterial: 7.44 (ref 7.350–7.450)
pH, Arterial: 7.352 (ref 7.350–7.450)
pH, Arterial: 7.282 — ABNORMAL LOW (ref 7.350–7.450)
pH, Arterial: 7.337 — ABNORMAL LOW (ref 7.350–7.450)
pH, Arterial: 7.384 (ref 7.350–7.450)
pO2, Arterial: 331 mmHg — ABNORMAL HIGH (ref 83.0–108.0)
pO2, Arterial: 393 mmHg — ABNORMAL HIGH (ref 83.0–108.0)
pO2, Arterial: 417 mmHg — ABNORMAL HIGH (ref 83.0–108.0)
pO2, Arterial: 291 mmHg — ABNORMAL HIGH (ref 83.0–108.0)
pO2, Arterial: 103 mmHg (ref 83.0–108.0)
pO2, Arterial: 162 mmHg — ABNORMAL HIGH (ref 83.0–108.0)

## 2017-11-02 LAB — POCT I-STAT 4, (NA,K, GLUC, HGB,HCT)
GLUCOSE: 173 mg/dL — AB (ref 70–99)
HCT: 25 % — ABNORMAL LOW (ref 39.0–52.0)
Hemoglobin: 8.5 g/dL — ABNORMAL LOW (ref 13.0–17.0)
POTASSIUM: 2.9 mmol/L — AB (ref 3.5–5.1)
SODIUM: 146 mmol/L — AB (ref 135–145)

## 2017-11-02 LAB — CBC WITH DIFFERENTIAL/PLATELET
Abs Immature Granulocytes: 0.18 10*3/uL — ABNORMAL HIGH (ref 0.00–0.07)
BASOS ABS: 0 10*3/uL (ref 0.0–0.1)
Basophils Relative: 0 %
EOS ABS: 0 10*3/uL (ref 0.0–0.5)
Eosinophils Relative: 0 %
HCT: 31 % — ABNORMAL LOW (ref 39.0–52.0)
Hemoglobin: 10 g/dL — ABNORMAL LOW (ref 13.0–17.0)
IMMATURE GRANULOCYTES: 1 %
LYMPHS ABS: 1.5 10*3/uL (ref 0.7–4.0)
LYMPHS PCT: 11 %
MCH: 29.3 pg (ref 26.0–34.0)
MCHC: 32.3 g/dL (ref 30.0–36.0)
MCV: 90.9 fL (ref 80.0–100.0)
MONOS PCT: 7 %
Monocytes Absolute: 1 10*3/uL (ref 0.1–1.0)
NEUTROS ABS: 11 10*3/uL — AB (ref 1.7–7.7)
NEUTROS PCT: 81 %
NRBC: 0 % (ref 0.0–0.2)
Platelets: 149 10*3/uL — ABNORMAL LOW (ref 150–400)
RBC: 3.41 MIL/uL — ABNORMAL LOW (ref 4.22–5.81)
RDW: 14.4 % (ref 11.5–15.5)
WBC: 13.7 10*3/uL — ABNORMAL HIGH (ref 4.0–10.5)

## 2017-11-02 LAB — GLUCOSE, CAPILLARY
GLUCOSE-CAPILLARY: 159 mg/dL — AB (ref 70–99)
GLUCOSE-CAPILLARY: 165 mg/dL — AB (ref 70–99)
Glucose-Capillary: 74 mg/dL (ref 70–99)
Glucose-Capillary: 182 mg/dL — ABNORMAL HIGH (ref 70–99)
Glucose-Capillary: 178 mg/dL — ABNORMAL HIGH (ref 70–99)

## 2017-11-02 LAB — POCT I-STAT 3, VENOUS BLOOD GAS (G3P V)
ACID-BASE EXCESS: 1 mmol/L (ref 0.0–2.0)
Bicarbonate: 27.5 mmol/L (ref 20.0–28.0)
O2 SAT: 60 %
PCO2 VEN: 55.3 mmHg (ref 44.0–60.0)
TCO2: 29 mmol/L (ref 22–32)
pH, Ven: 7.304 (ref 7.250–7.430)
pO2, Ven: 35 mmHg (ref 32.0–45.0)

## 2017-11-02 LAB — BASIC METABOLIC PANEL
Anion gap: 8 (ref 5–15)
BUN: 21 mg/dL (ref 8–23)
CALCIUM: 8.9 mg/dL (ref 8.9–10.3)
CO2: 30 mmol/L (ref 22–32)
CREATININE: 1.13 mg/dL (ref 0.61–1.24)
Chloride: 99 mmol/L (ref 98–111)
Glucose, Bld: 178 mg/dL — ABNORMAL HIGH (ref 70–99)
Potassium: 3.8 mmol/L (ref 3.5–5.1)
SODIUM: 137 mmol/L (ref 135–145)

## 2017-11-02 LAB — CBC
HCT: 37.7 % — ABNORMAL LOW (ref 39.0–52.0)
HEMOGLOBIN: 12.1 g/dL — AB (ref 13.0–17.0)
MCH: 29.4 pg (ref 26.0–34.0)
MCHC: 32.1 g/dL (ref 30.0–36.0)
MCV: 91.5 fL (ref 80.0–100.0)
PLATELETS: 187 10*3/uL (ref 150–400)
RBC: 4.12 MIL/uL — ABNORMAL LOW (ref 4.22–5.81)
RDW: 13.6 % (ref 11.5–15.5)
WBC: 7.4 10*3/uL (ref 4.0–10.5)
nRBC: 0 % (ref 0.0–0.2)

## 2017-11-02 LAB — HEMOGLOBIN AND HEMATOCRIT, BLOOD
HEMATOCRIT: 26.4 % — AB (ref 39.0–52.0)
HEMATOCRIT: 28.8 % — AB (ref 39.0–52.0)
HEMOGLOBIN: 9.5 g/dL — AB (ref 13.0–17.0)
Hemoglobin: 8.6 g/dL — ABNORMAL LOW (ref 13.0–17.0)

## 2017-11-02 LAB — COOXEMETRY PANEL
Carboxyhemoglobin: 1.5 % (ref 0.5–1.5)
Methemoglobin: 1.4 % (ref 0.0–1.5)
O2 Saturation: 67.6 %
TOTAL HEMOGLOBIN: 10.2 g/dL — AB (ref 12.0–16.0)

## 2017-11-02 LAB — HEPARIN LEVEL (UNFRACTIONATED): HEPARIN UNFRACTIONATED: 0.81 [IU]/mL — AB (ref 0.30–0.70)

## 2017-11-02 LAB — MAGNESIUM: MAGNESIUM: 1.8 mg/dL (ref 1.7–2.4)

## 2017-11-02 LAB — APTT
APTT: 91 s — AB (ref 24–36)
aPTT: 47 seconds — ABNORMAL HIGH (ref 24–36)
aPTT: 92 seconds — ABNORMAL HIGH (ref 24–36)

## 2017-11-02 LAB — LACTIC ACID, PLASMA: Lactic Acid, Venous: 5.5 mmol/L (ref 0.5–1.9)

## 2017-11-02 LAB — FIBRINOGEN
FIBRINOGEN: 243 mg/dL (ref 210–475)
Fibrinogen: 150 mg/dL — ABNORMAL LOW (ref 210–475)

## 2017-11-02 LAB — PREPARE RBC (CROSSMATCH)

## 2017-11-02 LAB — PROTIME-INR
INR: 1.75
INR: 1.54
PROTHROMBIN TIME: 20.2 s — AB (ref 11.4–15.2)
Prothrombin Time: 18.3 seconds — ABNORMAL HIGH (ref 11.4–15.2)

## 2017-11-02 LAB — PLATELET COUNT
Platelets: 148 10*3/uL — ABNORMAL LOW (ref 150–400)
Platelets: 98 10*3/uL — ABNORMAL LOW (ref 150–400)

## 2017-11-02 LAB — TROPONIN I: Troponin I: 0.11 ng/mL (ref <0.03)

## 2017-11-02 LAB — LACTATE DEHYDROGENASE: LDH: 703 U/L — AB (ref 98–192)

## 2017-11-02 SURGERY — CORONARY ARTERY BYPASS GRAFTING (CABG)
Anesthesia: General | Site: Chest

## 2017-11-02 MED ORDER — LIDOCAINE HCL (CARDIAC) PF 50 MG/5ML IV SOSY
PREFILLED_SYRINGE | INTRAVENOUS | Status: DC | PRN
  Administered 2017-11-02: 100 mg via INTRAVENOUS

## 2017-11-02 MED ORDER — LACTATED RINGERS IV SOLN
INTRAVENOUS | Status: DC | PRN
  Administered 2017-11-02 (×2): via INTRAVENOUS

## 2017-11-02 MED ORDER — SODIUM CHLORIDE 0.9 % IV SOLN
INTRAVENOUS | Status: DC | PRN
  Administered 2017-11-02: 14:00:00

## 2017-11-02 MED ORDER — SODIUM CHLORIDE 0.9 % IV SOLN
INTRAVENOUS | Status: AC
  Filled 2017-11-02: qty 30

## 2017-11-02 MED ORDER — SODIUM CHLORIDE 0.9 % IV SOLN
1.5000 g | INTRAVENOUS | Status: AC
  Administered 2017-11-02: .75 g via INTRAVENOUS
  Administered 2017-11-02: 1.5 g via INTRAVENOUS
  Filled 2017-11-02: qty 1.5

## 2017-11-02 MED ORDER — CALCIUM CHLORIDE 10 % IV SOLN
INTRAVENOUS | Status: AC
  Filled 2017-11-02: qty 10

## 2017-11-02 MED ORDER — VANCOMYCIN HCL 10 G IV SOLR
1250.0000 mg | INTRAVENOUS | Status: DC
  Filled 2017-11-02: qty 1250

## 2017-11-02 MED ORDER — SODIUM CHLORIDE 0.9 % IV SOLN: 250.0000 mL | INTRAVENOUS | Status: DC

## 2017-11-02 MED ORDER — DEXMEDETOMIDINE HCL IN NACL 400 MCG/100ML IV SOLN
0.0000 ug/kg/h | INTRAVENOUS | Status: DC
  Administered 2017-11-02: 0.7 ug/kg/h via INTRAVENOUS
  Filled 2017-11-02: qty 100

## 2017-11-02 MED ORDER — HEPARIN SODIUM (PORCINE) 1000 UNIT/ML IJ SOLN
INTRAMUSCULAR | Status: DC | PRN
  Administered 2017-11-02: 31000 [IU] via INTRAVENOUS
  Administered 2017-11-02: 30000 [IU] via INTRAVENOUS
  Administered 2017-11-02: 35000 [IU] via INTRAVENOUS
  Administered 2017-11-02: 4000 [IU] via INTRAVENOUS

## 2017-11-02 MED ORDER — VANCOMYCIN HCL 10 G IV SOLR
1500.0000 mg | INTRAVENOUS | Status: DC
  Filled 2017-11-02: qty 1500

## 2017-11-02 MED ORDER — PHENYLEPHRINE HCL-NACL 20-0.9 MG/250ML-% IV SOLN
30.0000 ug/min | INTRAVENOUS | Status: AC
  Filled 2017-11-02 (×2): qty 250

## 2017-11-02 MED ORDER — TRANEXAMIC ACID (OHS) BOLUS VIA INFUSION
15.0000 mg/kg | INTRAVENOUS | Status: AC
  Administered 2017-11-02: 1323 mg via INTRAVENOUS
  Filled 2017-11-02: qty 1323

## 2017-11-02 MED ORDER — VANCOMYCIN HCL IN DEXTROSE 1-5 GM/200ML-% IV SOLN
1000.0000 mg | Freq: Once | INTRAVENOUS | Status: AC
  Administered 2017-11-02: 1000 mg via INTRAVENOUS

## 2017-11-02 MED ORDER — POTASSIUM CHLORIDE 2 MEQ/ML IV SOLN
80.0000 meq | INTRAVENOUS | Status: AC
  Filled 2017-11-02: qty 40

## 2017-11-02 MED ORDER — EPINEPHRINE PF 1 MG/10ML IJ SOSY
PREFILLED_SYRINGE | INTRAMUSCULAR | Status: AC
  Filled 2017-11-02: qty 10

## 2017-11-02 MED ORDER — KENNESTONE BLOOD CARDIOPLEGIA VIAL
13.0000 mL | Freq: Once | Status: DC
  Filled 2017-11-02: qty 13

## 2017-11-02 MED ORDER — TRANEXAMIC ACID (OHS) PUMP PRIME SOLUTION
2.0000 mg/kg | INTRAVENOUS | Status: AC
  Filled 2017-11-02: qty 1.76

## 2017-11-02 MED ORDER — PROPOFOL 10 MG/ML IV BOLUS
INTRAVENOUS | Status: AC
  Filled 2017-11-02: qty 20

## 2017-11-02 MED ORDER — ROCURONIUM BROMIDE 50 MG/5ML IV SOSY
PREFILLED_SYRINGE | INTRAVENOUS | Status: AC
  Filled 2017-11-02: qty 15

## 2017-11-02 MED ORDER — MORPHINE SULFATE (PF) 2 MG/ML IV SOLN
1.0000 mg | INTRAVENOUS | Status: DC | PRN
  Administered 2017-11-02 – 2017-11-03 (×8): 2 mg via INTRAVENOUS
  Filled 2017-11-02 (×5): qty 2

## 2017-11-02 MED ORDER — DEXMEDETOMIDINE HCL IN NACL 400 MCG/100ML IV SOLN
0.1000 ug/kg/h | INTRAVENOUS | Status: AC
  Administered 2017-11-02: .7 ug/kg/h via INTRAVENOUS
  Filled 2017-11-02 (×2): qty 100

## 2017-11-02 MED ORDER — POTASSIUM CHLORIDE 10 MEQ/50ML IV SOLN
10.0000 meq | INTRAVENOUS | Status: AC
  Administered 2017-11-02 (×3): 10 meq via INTRAVENOUS

## 2017-11-02 MED ORDER — POTASSIUM CHLORIDE 10 MEQ/50ML IV SOLN
INTRAVENOUS | Status: AC
  Administered 2017-11-02: 10 meq via INTRAVENOUS
  Filled 2017-11-02: qty 50

## 2017-11-02 MED ORDER — SODIUM CHLORIDE 0.45 % IV SOLN
INTRAVENOUS | Status: DC | PRN
  Administered 2017-11-02: 17:00:00 via INTRAVENOUS

## 2017-11-02 MED ORDER — ORAL CARE MOUTH RINSE
15.0000 mL | OROMUCOSAL | Status: DC
  Administered 2017-11-02 – 2017-11-04 (×17): 15 mL via OROMUCOSAL

## 2017-11-02 MED ORDER — KENNESTONE BLOOD CARDIOPLEGIA (KBC) MANNITOL SYRINGE (20%, 32ML)
32.0000 mL | Freq: Once | INTRAVENOUS | Status: DC
  Filled 2017-11-02: qty 32

## 2017-11-02 MED ORDER — MIDAZOLAM HCL 10 MG/2ML IJ SOLN
INTRAMUSCULAR | Status: AC
  Filled 2017-11-02: qty 2

## 2017-11-02 MED ORDER — NOREPINEPHRINE BITARTRATE 1 MG/ML IV SOLN
INTRAVENOUS | Status: DC | PRN
  Administered 2017-11-02: 8 ug/kg/min via INTRAVENOUS
  Administered 2017-11-02: 20 ug/kg/min via INTRAVENOUS

## 2017-11-02 MED ORDER — MORPHINE SULFATE (PF) 2 MG/ML IV SOLN
2.0000 mg | Freq: Once | INTRAVENOUS | Status: AC
  Administered 2017-11-02: 2 mg via INTRAVENOUS

## 2017-11-02 MED ORDER — ETOMIDATE 2 MG/ML IV SOLN
INTRAVENOUS | Status: DC | PRN
  Administered 2017-11-02: 14 mg via INTRAVENOUS

## 2017-11-02 MED ORDER — LACTATED RINGERS IV SOLN
INTRAVENOUS | Status: DC | PRN
  Administered 2017-11-02 (×3): via INTRAVENOUS

## 2017-11-02 MED ORDER — CHLORHEXIDINE GLUCONATE 0.12 % MT SOLN: 15.0000 mL | OROMUCOSAL | Status: AC

## 2017-11-02 MED ORDER — INSULIN REGULAR(HUMAN) IN NACL 100-0.9 UT/100ML-% IV SOLN
INTRAVENOUS | Status: AC
  Filled 2017-11-02 (×4): qty 100

## 2017-11-02 MED ORDER — AMIODARONE HCL IN DEXTROSE 360-4.14 MG/200ML-% IV SOLN
30.0000 mg/h | INTRAVENOUS | Status: DC
  Filled 2017-11-02: qty 200

## 2017-11-02 MED ORDER — SODIUM CHLORIDE 0.9% FLUSH
3.0000 mL | Freq: Two times a day (BID) | INTRAVENOUS | Status: DC
  Administered 2017-11-03 – 2017-11-04 (×3): 3 mL via INTRAVENOUS

## 2017-11-02 MED ORDER — MIDAZOLAM HCL 2 MG/2ML IJ SOLN
2.0000 mg | INTRAMUSCULAR | Status: DC | PRN
  Administered 2017-11-02 – 2017-11-04 (×8): 2 mg via INTRAVENOUS
  Filled 2017-11-02 (×9): qty 2

## 2017-11-02 MED ORDER — SODIUM BICARBONATE 4.2 % IV SOLN
INTRAVENOUS | Status: DC | PRN
  Administered 2017-11-02: 50 mL via INTRAVENOUS

## 2017-11-02 MED ORDER — ALBUMIN HUMAN 5 % IV SOLN
250.0000 mL | INTRAVENOUS | Status: AC | PRN
  Administered 2017-11-02 (×4): 12.5 g via INTRAVENOUS

## 2017-11-02 MED ORDER — ALBUMIN HUMAN 5 % IV SOLN
INTRAVENOUS | Status: AC
  Administered 2017-11-02: 12.5 g via INTRAVENOUS
  Filled 2017-11-02: qty 250

## 2017-11-02 MED ORDER — DOPAMINE-DEXTROSE 3.2-5 MG/ML-% IV SOLN: 3.0000 ug/kg/min | INTRAVENOUS | Status: DC

## 2017-11-02 MED ORDER — ACETAMINOPHEN 160 MG/5ML PO SOLN
1000.0000 mg | Freq: Four times a day (QID) | ORAL | Status: DC
  Administered 2017-11-03 – 2017-11-04 (×7): 1000 mg
  Filled 2017-11-02 (×7): qty 40.6

## 2017-11-02 MED ORDER — ACETAMINOPHEN 500 MG PO TABS: 1000.0000 mg | ORAL_TABLET | Freq: Four times a day (QID) | ORAL | Status: DC

## 2017-11-02 MED ORDER — PHENYLEPHRINE HCL-NACL 20-0.9 MG/250ML-% IV SOLN
0.0000 ug/min | INTRAVENOUS | Status: DC
  Administered 2017-11-02: 90 ug/min via INTRAVENOUS
  Administered 2017-11-03: 55 ug/min via INTRAVENOUS
  Filled 2017-11-02 (×2): qty 250

## 2017-11-02 MED ORDER — EPINEPHRINE PF 1 MG/ML IJ SOLN
INTRAVENOUS | Status: DC | PRN
  Administered 2017-11-02: 3 ug/min via INTRAVENOUS

## 2017-11-02 MED ORDER — AMIODARONE HCL IN DEXTROSE 360-4.14 MG/200ML-% IV SOLN
INTRAVENOUS | Status: DC | PRN
  Administered 2017-11-02 (×2): 60 mg/h via INTRAVENOUS

## 2017-11-02 MED ORDER — SODIUM CHLORIDE 0.9 % IV SOLN
INTRAVENOUS | Status: AC
  Filled 2017-11-02: qty 1.2

## 2017-11-02 MED ORDER — ALBUMIN HUMAN 5 % IV SOLN
INTRAVENOUS | Status: AC
  Administered 2017-11-02: 12.5 g
  Filled 2017-11-02: qty 250

## 2017-11-02 MED ORDER — EPINEPHRINE PF 1 MG/ML IJ SOLN
0.0000 ug/min | INTRAVENOUS | Status: DC
  Filled 2017-11-02 (×2): qty 4

## 2017-11-02 MED ORDER — ROCURONIUM BROMIDE 50 MG/5ML IV SOSY
PREFILLED_SYRINGE | INTRAVENOUS | Status: AC
  Filled 2017-11-02: qty 5

## 2017-11-02 MED ORDER — PROTAMINE SULFATE 10 MG/ML IV SOLN
INTRAVENOUS | Status: DC | PRN
  Administered 2017-11-02: 290 mg via INTRAVENOUS
  Administered 2017-11-02 (×2): 10 mg via INTRAVENOUS
  Administered 2017-11-02: 300 mg via INTRAVENOUS

## 2017-11-02 MED ORDER — SODIUM CHLORIDE 0.9 % IV SOLN
INTRAVENOUS | Status: DC
  Administered 2017-11-02: 17:00:00 via INTRAVENOUS

## 2017-11-02 MED ORDER — NITROGLYCERIN IN D5W 200-5 MCG/ML-% IV SOLN
2.0000 ug/min | INTRAVENOUS | Status: AC
  Filled 2017-11-02: qty 250

## 2017-11-02 MED ORDER — MILRINONE LACTATE IN DEXTROSE 20-5 MG/100ML-% IV SOLN
0.3000 ug/kg/min | INTRAVENOUS | Status: AC
  Administered 2017-11-02: .3 ug/kg/min via INTRAVENOUS
  Administered 2017-11-02: 0.5 ug/kg/min via INTRAVENOUS
  Filled 2017-11-02 (×2): qty 100

## 2017-11-02 MED ORDER — FENTANYL CITRATE (PF) 250 MCG/5ML IJ SOLN
INTRAMUSCULAR | Status: DC | PRN
  Administered 2017-11-02: 100 ug via INTRAVENOUS
  Administered 2017-11-02 (×2): 150 ug via INTRAVENOUS
  Administered 2017-11-02: 100 ug via INTRAVENOUS

## 2017-11-02 MED ORDER — MILRINONE LACTATE IN DEXTROSE 20-5 MG/100ML-% IV SOLN
0.5000 ug/kg/min | INTRAVENOUS | Status: DC
  Administered 2017-11-03 – 2017-11-04 (×6): 0.5 ug/kg/min via INTRAVENOUS
  Filled 2017-11-02 (×6): qty 100

## 2017-11-02 MED ORDER — VANCOMYCIN HCL 1000 MG IV SOLR
INTRAVENOUS | Status: DC | PRN
  Administered 2017-11-02: 1500 mg via INTRAVENOUS

## 2017-11-02 MED ORDER — SUCCINYLCHOLINE CHLORIDE 200 MG/10ML IV SOSY
PREFILLED_SYRINGE | INTRAVENOUS | Status: AC
  Filled 2017-11-02: qty 10

## 2017-11-02 MED ORDER — EPINEPHRINE PF 1 MG/10ML IJ SOSY
PREFILLED_SYRINGE | INTRAMUSCULAR | Status: DC | PRN
  Administered 2017-11-02: 1 mg via INTRAVENOUS

## 2017-11-02 MED ORDER — SODIUM CHLORIDE 0.9 % IV SOLN
INTRAVENOUS | Status: DC | PRN
  Administered 2017-11-02: 1 [IU]/h via INTRAVENOUS

## 2017-11-02 MED ORDER — AMIODARONE HCL IN DEXTROSE 360-4.14 MG/200ML-% IV SOLN
60.0000 mg/h | INTRAVENOUS | Status: DC
  Administered 2017-11-02 – 2017-11-03 (×3): 30 mg/h via INTRAVENOUS
  Administered 2017-11-04 (×2): 60 mg/h via INTRAVENOUS
  Administered 2017-11-04 (×2): 30 mg/h via INTRAVENOUS
  Filled 2017-11-02 (×6): qty 200

## 2017-11-02 MED ORDER — HEPARIN SODIUM (PORCINE) 1000 UNIT/ML IJ SOLN
INTRAMUSCULAR | Status: AC
  Filled 2017-11-02: qty 1

## 2017-11-02 MED ORDER — ACETAMINOPHEN 160 MG/5ML PO SOLN: 650.0000 mg | Freq: Once | ORAL | Status: AC

## 2017-11-02 MED ORDER — SODIUM CHLORIDE 0.9 % IV SOLN
INTRAVENOUS | Status: DC | PRN
  Administered 2017-11-02: 100 ug/min via INTRAVENOUS

## 2017-11-02 MED ORDER — PHENYLEPHRINE HCL-NACL 20-0.9 MG/250ML-% IV SOLN
30.0000 ug/min | INTRAVENOUS | Status: DC
  Filled 2017-11-02: qty 250

## 2017-11-02 MED ORDER — CALCIUM CHLORIDE 10 % IV SOLN
INTRAVENOUS | Status: DC | PRN
  Administered 2017-11-02: 600 mg via INTRAVENOUS
  Administered 2017-11-02 (×2): 200 mg via INTRAVENOUS

## 2017-11-02 MED ORDER — SODIUM CHLORIDE 0.9 % IV SOLN
1.5000 g | Freq: Two times a day (BID) | INTRAVENOUS | Status: DC
  Administered 2017-11-03: 1.5 g via INTRAVENOUS
  Filled 2017-11-02 (×2): qty 1.5

## 2017-11-02 MED ORDER — SODIUM CHLORIDE 0.9 % IV SOLN
INTRAVENOUS | Status: DC
  Administered 2017-11-02: 10 mL/h via INTRAVENOUS
  Administered 2017-11-03: 22:00:00 via INTRAVENOUS

## 2017-11-02 MED ORDER — LACTATED RINGERS IV SOLN
500.0000 mL | Freq: Once | INTRAVENOUS | Status: AC | PRN
  Administered 2017-11-03: 500 mL via INTRAVENOUS

## 2017-11-02 MED ORDER — KENNESTONE BLOOD CARDIOPLEGIA VIAL
13.0000 mL | Freq: Once | Status: DC
  Filled 2017-11-02 (×2): qty 13

## 2017-11-02 MED ORDER — INSULIN REGULAR(HUMAN) IN NACL 100-0.9 UT/100ML-% IV SOLN
INTRAVENOUS | Status: DC
  Administered 2017-11-02: 9 [IU] via INTRAVENOUS
  Administered 2017-11-03: 7.9 [IU] via INTRAVENOUS
  Administered 2017-11-03 – 2017-11-04 (×2): 100 [IU] via INTRAVENOUS
  Filled 2017-11-02 (×3): qty 100

## 2017-11-02 MED ORDER — VANCOMYCIN HCL 1000 MG IV SOLR
INTRAVENOUS | Status: DC | PRN
  Administered 2017-11-02: 09:00:00

## 2017-11-02 MED ORDER — MAGNESIUM SULFATE 4 GM/100ML IV SOLN
INTRAVENOUS | Status: AC
  Administered 2017-11-02: 4 g via INTRAVENOUS
  Filled 2017-11-02: qty 100

## 2017-11-02 MED ORDER — MIDAZOLAM HCL 5 MG/5ML IJ SOLN
INTRAMUSCULAR | Status: DC | PRN
  Administered 2017-11-02: 1 mg via INTRAVENOUS
  Administered 2017-11-02 (×5): 2 mg via INTRAVENOUS
  Administered 2017-11-02: 1 mg via INTRAVENOUS

## 2017-11-02 MED ORDER — PLASMA-LYTE 148 IV SOLN
INTRAVENOUS | Status: DC | PRN
  Administered 2017-11-02: 09:00:00 via INTRAVASCULAR

## 2017-11-02 MED ORDER — VANCOMYCIN HCL 1000 MG IV SOLR
INTRAVENOUS | Status: DC
  Filled 2017-11-02: qty 1000

## 2017-11-02 MED ORDER — EPHEDRINE 5 MG/ML INJ
INTRAVENOUS | Status: AC
  Filled 2017-11-02: qty 10

## 2017-11-02 MED ORDER — FENTANYL CITRATE (PF) 250 MCG/5ML IJ SOLN
INTRAMUSCULAR | Status: AC
  Filled 2017-11-02: qty 20

## 2017-11-02 MED ORDER — SODIUM CHLORIDE 0.9% FLUSH: 3.0000 mL | INTRAVENOUS | Status: DC | PRN

## 2017-11-02 MED ORDER — PLASMA-LYTE 148 IV SOLN
INTRAVENOUS | Status: DC
  Filled 2017-11-02: qty 2.5

## 2017-11-02 MED ORDER — ARTIFICIAL TEARS OPHTHALMIC OINT
TOPICAL_OINTMENT | OPHTHALMIC | Status: DC | PRN
  Administered 2017-11-02: 1 via OPHTHALMIC

## 2017-11-02 MED ORDER — HYDROCORTISONE NA SUCCINATE PF 100 MG IJ SOLR
INTRAMUSCULAR | Status: DC | PRN
  Administered 2017-11-02: 125 mg via INTRAVENOUS

## 2017-11-02 MED ORDER — SODIUM CHLORIDE 0.9 % IV SOLN
1.5000 g | Freq: Two times a day (BID) | INTRAVENOUS | Status: DC
  Filled 2017-11-02: qty 1.5

## 2017-11-02 MED ORDER — HYDROCORTISONE NA SUCCINATE PF 250 MG IJ SOLR
INTRAMUSCULAR | Status: AC
  Filled 2017-11-02: qty 250

## 2017-11-02 MED ORDER — TRANEXAMIC ACID 1000 MG/10ML IV SOLN
1.5000 mg/kg/h | INTRAVENOUS | Status: AC
  Administered 2017-11-02: 1.5 mg/kg/h via INTRAVENOUS
  Filled 2017-11-02 (×2): qty 25

## 2017-11-02 MED ORDER — HEMOSTATIC AGENTS (NO CHARGE) OPTIME
TOPICAL | Status: DC | PRN
  Administered 2017-11-02 (×6): 1 via TOPICAL

## 2017-11-02 MED ORDER — CHLORHEXIDINE GLUCONATE 0.12% ORAL RINSE (MEDLINE KIT)
15.0000 mL | Freq: Two times a day (BID) | OROMUCOSAL | Status: DC
  Administered 2017-11-02 – 2017-11-04 (×4): 15 mL via OROMUCOSAL

## 2017-11-02 MED ORDER — AMIODARONE HCL IN DEXTROSE 360-4.14 MG/200ML-% IV SOLN: INTRAVENOUS | Status: DC | PRN

## 2017-11-02 MED ORDER — AMIODARONE HCL IN DEXTROSE 360-4.14 MG/200ML-% IV SOLN
INTRAVENOUS | Status: AC
  Filled 2017-11-02: qty 200

## 2017-11-02 MED ORDER — MAGNESIUM SULFATE 50 % IJ SOLN
40.0000 meq | INTRAMUSCULAR | Status: AC
  Filled 2017-11-02 (×2): qty 9.85

## 2017-11-02 MED ORDER — ASPIRIN EC 325 MG PO TBEC: 325.0000 mg | DELAYED_RELEASE_TABLET | Freq: Every day | ORAL | Status: DC

## 2017-11-02 MED ORDER — MORPHINE SULFATE (PF) 2 MG/ML IV SOLN
1.0000 mg | INTRAVENOUS | Status: DC | PRN
  Administered 2017-11-03 – 2017-11-04 (×7): 2 mg via INTRAVENOUS
  Filled 2017-11-02 (×6): qty 1

## 2017-11-02 MED ORDER — METOPROLOL TARTRATE 5 MG/5ML IV SOLN
2.5000 mg | INTRAVENOUS | Status: DC | PRN
  Administered 2017-11-04: 2.5 mg via INTRAVENOUS
  Filled 2017-11-02: qty 5

## 2017-11-02 MED ORDER — ROCURONIUM BROMIDE 10 MG/ML (PF) SYRINGE
PREFILLED_SYRINGE | INTRAVENOUS | Status: DC | PRN
  Administered 2017-11-02: 20 mg via INTRAVENOUS
  Administered 2017-11-02 (×5): 50 mg via INTRAVENOUS

## 2017-11-02 MED ORDER — 0.9 % SODIUM CHLORIDE (POUR BTL) OPTIME
TOPICAL | Status: DC | PRN
  Administered 2017-11-02: 2000 mL
  Administered 2017-11-02: 5000 mL

## 2017-11-02 MED ORDER — METOPROLOL TARTRATE 12.5 MG HALF TABLET: 12.5000 mg | ORAL_TABLET | Freq: Once | ORAL | Status: DC

## 2017-11-02 MED ORDER — LACTATED RINGERS IV SOLN: INTRAVENOUS | Status: DC

## 2017-11-02 MED ORDER — EPINEPHRINE PF 1 MG/ML IJ SOLN
0.0000 ug/min | INTRAVENOUS | Status: AC
  Administered 2017-11-02: 2 ug/min via INTRAVENOUS
  Filled 2017-11-02: qty 4

## 2017-11-02 MED ORDER — PROTAMINE SULFATE 10 MG/ML IV SOLN
INTRAVENOUS | Status: AC
  Filled 2017-11-02: qty 10

## 2017-11-02 MED ORDER — FENTANYL CITRATE (PF) 250 MCG/5ML IJ SOLN
INTRAMUSCULAR | Status: AC
  Filled 2017-11-02: qty 5

## 2017-11-02 MED ORDER — INSULIN REGULAR BOLUS VIA INFUSION
0.0000 [IU] | Freq: Three times a day (TID) | INTRAVENOUS | Status: DC
  Filled 2017-11-02: qty 10

## 2017-11-02 MED ORDER — FUROSEMIDE 40 MG PO TABS: 40.0000 mg | ORAL_TABLET | Freq: Every day | ORAL | Status: DC

## 2017-11-02 MED ORDER — DEXTROSE 5 % SOLN FOR IMPELLA PURGE CATHETER
INTRAVENOUS | Status: DC
  Administered 2017-11-02: 500 mL
  Filled 2017-11-02: qty 1000

## 2017-11-02 MED ORDER — CHLORHEXIDINE GLUCONATE CLOTH 2 % EX PADS: 6.0000 | MEDICATED_PAD | Freq: Once | CUTANEOUS | Status: DC

## 2017-11-02 MED ORDER — CHLORHEXIDINE GLUCONATE 0.12 % MT SOLN: 15.0000 mL | Freq: Once | OROMUCOSAL | Status: DC

## 2017-11-02 MED ORDER — ACETAMINOPHEN 650 MG RE SUPP
650.0000 mg | Freq: Once | RECTAL | Status: AC
  Administered 2017-11-02: 650 mg via RECTAL

## 2017-11-02 MED ORDER — MORPHINE SULFATE (PF) 2 MG/ML IV SOLN
1.0000 mg | INTRAVENOUS | Status: DC | PRN
  Administered 2017-11-02: 2 mg via INTRAVENOUS
  Filled 2017-11-02 (×2): qty 1

## 2017-11-02 MED ORDER — ONDANSETRON HCL 4 MG/2ML IJ SOLN: 4.0000 mg | Freq: Four times a day (QID) | INTRAMUSCULAR | Status: DC | PRN

## 2017-11-02 MED ORDER — LACTATED RINGERS IV SOLN: INTRAVENOUS | Status: DC | PRN

## 2017-11-02 MED ORDER — DOPAMINE-DEXTROSE 3.2-5 MG/ML-% IV SOLN
0.0000 ug/kg/min | INTRAVENOUS | Status: AC
  Administered 2017-11-02: 3 ug/kg/min via INTRAVENOUS
  Filled 2017-11-02: qty 250

## 2017-11-02 MED ORDER — ALBUMIN HUMAN 5 % IV SOLN
INTRAVENOUS | Status: DC | PRN
  Administered 2017-11-02 (×3): via INTRAVENOUS

## 2017-11-02 MED ORDER — PHENYLEPHRINE 40 MCG/ML (10ML) SYRINGE FOR IV PUSH (FOR BLOOD PRESSURE SUPPORT)
PREFILLED_SYRINGE | INTRAVENOUS | Status: AC
  Filled 2017-11-02: qty 20

## 2017-11-02 MED ORDER — MAGNESIUM SULFATE 4 GM/100ML IV SOLN
4.0000 g | Freq: Once | INTRAVENOUS | Status: AC
  Administered 2017-11-02: 4 g via INTRAVENOUS

## 2017-11-02 MED ORDER — NOREPINEPHRINE 4 MG/250ML-% IV SOLN
0.0000 ug/min | INTRAVENOUS | Status: DC
  Administered 2017-11-02: 6 ug/min via INTRAVENOUS
  Administered 2017-11-03: 12 ug/min via INTRAVENOUS
  Administered 2017-11-03: 14 ug/min via INTRAVENOUS
  Administered 2017-11-03: 12 ug/min via INTRAVENOUS
  Administered 2017-11-03: 10 ug/min via INTRAVENOUS
  Filled 2017-11-02 (×4): qty 250

## 2017-11-02 MED ORDER — SODIUM CHLORIDE 0.9 % IV SOLN
750.0000 mg | INTRAVENOUS | Status: AC
  Filled 2017-11-02 (×2): qty 750

## 2017-11-02 MED ORDER — SODIUM CHLORIDE 0.9 % IJ SOLN
OROMUCOSAL | Status: DC | PRN
  Administered 2017-11-02: 09:00:00 via TOPICAL

## 2017-11-02 MED ORDER — EPHEDRINE SULFATE 50 MG/ML IJ SOLN
INTRAMUSCULAR | Status: DC | PRN
  Administered 2017-11-02: 10 mg via INTRAVENOUS

## 2017-11-02 MED ORDER — PHENYLEPHRINE HCL 10 MG/ML IJ SOLN
INTRAMUSCULAR | Status: DC | PRN
  Administered 2017-11-02: 80 ug via INTRAVENOUS
  Administered 2017-11-02: 200 ug via INTRAVENOUS
  Administered 2017-11-02: 80 ug via INTRAVENOUS

## 2017-11-02 MED ORDER — FAMOTIDINE IN NACL 20-0.9 MG/50ML-% IV SOLN
20.0000 mg | Freq: Two times a day (BID) | INTRAVENOUS | Status: DC
  Administered 2017-11-02 – 2017-11-04 (×5): 20 mg via INTRAVENOUS
  Filled 2017-11-02 (×3): qty 50

## 2017-11-02 MED ORDER — NOREPINEPHRINE 4 MG/250ML-% IV SOLN
0.0000 ug/min | INTRAVENOUS | Status: DC
  Filled 2017-11-02: qty 250

## 2017-11-02 MED ORDER — PROTAMINE SULFATE 10 MG/ML IV SOLN
INTRAVENOUS | Status: AC
  Filled 2017-11-02: qty 50

## 2017-11-02 MED ORDER — VASOPRESSIN 20 UNIT/ML IV SOLN
INTRAVENOUS | Status: DC | PRN
  Administered 2017-11-02: 2 [IU] via INTRAVENOUS

## 2017-11-02 MED ORDER — NITROGLYCERIN IN D5W 200-5 MCG/ML-% IV SOLN: 0.0000 ug/min | INTRAVENOUS | Status: DC

## 2017-11-02 MED ORDER — ASPIRIN 81 MG PO CHEW
324.0000 mg | CHEWABLE_TABLET | Freq: Every day | ORAL | Status: DC
  Administered 2017-11-03 – 2017-11-04 (×2): 324 mg
  Filled 2017-11-02 (×2): qty 4

## 2017-11-02 MED ORDER — DEXTROSE 5 % SOLN FOR IMPELLA PURGE CATHETER
INTRAVENOUS | Status: DC
  Filled 2017-11-02: qty 1000

## 2017-11-02 SURGICAL SUPPLY — 125 items
ADH SKN CLS APL DERMABOND .7 (GAUZE/BANDAGES/DRESSINGS) ×4
ARTICLIP LAA PROCLIP II 45 (Clip) ×5 IMPLANT
BAG DECANTER FOR FLEXI CONT (MISCELLANEOUS) ×10 IMPLANT
BANDAGE ACE 4X5 VEL STRL LF (GAUZE/BANDAGES/DRESSINGS) ×5 IMPLANT
BANDAGE ACE 6X5 VEL STRL LF (GAUZE/BANDAGES/DRESSINGS) ×5 IMPLANT
BANDAGE ELASTIC 4 VELCRO ST LF (GAUZE/BANDAGES/DRESSINGS) ×1 IMPLANT
BANDAGE ELASTIC 6 VELCRO ST LF (GAUZE/BANDAGES/DRESSINGS) ×1 IMPLANT
BASKET HEART (ORDER IN 25'S) (MISCELLANEOUS) ×1
BASKET HEART (ORDER IN 25S) (MISCELLANEOUS) ×4 IMPLANT
BLADE CLIPPER SURG (BLADE) IMPLANT
BLADE STERNUM SYSTEM 6 (BLADE) ×5 IMPLANT
BNDG GAUZE ELAST 4 BULKY (GAUZE/BANDAGES/DRESSINGS) ×5 IMPLANT
CANISTER SUCT 3000ML PPV (MISCELLANEOUS) ×5 IMPLANT
CANNULA EZ GLIDE AORTIC 21FR (CANNULA) ×10 IMPLANT
CATH CPB KIT OWEN (MISCELLANEOUS) ×5 IMPLANT
CATH DIAG EXPO 6F FR4 (CATHETERS) ×1 IMPLANT
CATH DIAG EXPO 6F VENT PIG 145 (CATHETERS) ×1 IMPLANT
CATH EXPO 5FR AL1 (CATHETERS) ×1 IMPLANT
CATH THORACIC 36FR (CATHETERS) ×5 IMPLANT
CLIP RETRACTION 3.0MM CORONARY (MISCELLANEOUS) ×1 IMPLANT
CLIP VESOCCLUDE MED 24/CT (CLIP) IMPLANT
CLIP VESOCCLUDE SM WIDE 24/CT (CLIP) IMPLANT
COVER WAND RF STERILE (DRAPES) ×5 IMPLANT
CRADLE DONUT ADULT HEAD (MISCELLANEOUS) ×5 IMPLANT
DERMABOND ADVANCED (GAUZE/BANDAGES/DRESSINGS) ×1
DERMABOND ADVANCED .7 DNX12 (GAUZE/BANDAGES/DRESSINGS) IMPLANT
DEVICE ATRICLIP LAA PRCLPII 45 (Clip) IMPLANT
DRAIN CHANNEL 32F RND 10.7 FF (WOUND CARE) ×10 IMPLANT
DRAPE CARDIOVASCULAR INCISE (DRAPES) ×5
DRAPE INCISE IOBAN 66X45 STRL (DRAPES) ×5 IMPLANT
DRAPE SLUSH/WARMER DISC (DRAPES) ×5 IMPLANT
DRAPE SRG 135X102X78XABS (DRAPES) ×4 IMPLANT
DRSG AQUACEL AG ADV 3.5X14 (GAUZE/BANDAGES/DRESSINGS) ×1 IMPLANT
DRSG COVADERM 4X14 (GAUZE/BANDAGES/DRESSINGS) ×5 IMPLANT
DRSG PAD ABDOMINAL 8X10 ST (GAUZE/BANDAGES/DRESSINGS) ×1 IMPLANT
ELECT BLADE 4.0 EZ CLEAN MEGAD (MISCELLANEOUS) ×5
ELECT REM PT RETURN 9FT ADLT (ELECTROSURGICAL) ×10
ELECTRODE BLDE 4.0 EZ CLN MEGD (MISCELLANEOUS) ×4 IMPLANT
ELECTRODE REM PT RTRN 9FT ADLT (ELECTROSURGICAL) ×8 IMPLANT
FELT TEFLON 1X6 (MISCELLANEOUS) ×11 IMPLANT
GAUZE SPONGE 4X4 12PLY STRL (GAUZE/BANDAGES/DRESSINGS) ×10 IMPLANT
GAUZE SPONGE 4X4 12PLY STRL LF (GAUZE/BANDAGES/DRESSINGS) ×3 IMPLANT
GLOVE ORTHO TXT STRL SZ7.5 (GLOVE) ×11 IMPLANT
GOWN STRL REUS W/ TWL LRG LVL3 (GOWN DISPOSABLE) ×16 IMPLANT
GOWN STRL REUS W/TWL LRG LVL3 (GOWN DISPOSABLE) ×20
GRAFT HEMASHIELD 10MM (Graft) ×5 IMPLANT
GRAFT VASC STRG 30X10STRL (Graft) IMPLANT
HEMOSTAT POWDER SURGIFOAM 1G (HEMOSTASIS) ×15 IMPLANT
INSERT FOGARTY SM (MISCELLANEOUS) ×1 IMPLANT
INSERT FOGARTY XLG (MISCELLANEOUS) ×5 IMPLANT
KIT BASIN OR (CUSTOM PROCEDURE TRAY) ×5 IMPLANT
KIT DILATOR VASC 18G NDL (KITS) ×2 IMPLANT
KIT DRAINAGE VACCUM ASSIST (KITS) ×1 IMPLANT
KIT SUCTION CATH 14FR (SUCTIONS) ×16 IMPLANT
KIT TURNOVER KIT B (KITS) ×5 IMPLANT
KIT VASOVIEW HEMOPRO VH 3000 (KITS) ×5 IMPLANT
LEAD PACING MYOCARDI (MISCELLANEOUS) ×5 IMPLANT
LINE VENT (MISCELLANEOUS) ×1 IMPLANT
LOOP VESSEL MAXI BLUE (MISCELLANEOUS) ×1 IMPLANT
MARKER GRAFT CORONARY BYPASS (MISCELLANEOUS) ×15 IMPLANT
NS IRRIG 1000ML POUR BTL (IV SOLUTION) ×25 IMPLANT
PACK E OPEN HEART (SUTURE) ×5 IMPLANT
PACK OPEN HEART (CUSTOM PROCEDURE TRAY) ×5 IMPLANT
PAD ARMBOARD 7.5X6 YLW CONV (MISCELLANEOUS) ×10 IMPLANT
PAD ELECT DEFIB RADIOL ZOLL (MISCELLANEOUS) ×5 IMPLANT
PENCIL BUTTON HOLSTER BLD 10FT (ELECTRODE) ×5 IMPLANT
POWDER SURGICEL 3.0 GRAM (HEMOSTASIS) ×1 IMPLANT
PUMP SET IMPELLA 5.0 AIC (CATHETERS) ×1 IMPLANT
PUNCH AORTIC ROT 4.0MM RCL 40 (MISCELLANEOUS) ×1 IMPLANT
PUNCH AORTIC ROTATE 4.0MM (MISCELLANEOUS) IMPLANT
PUNCH AORTIC ROTATE 4.5MM 8IN (MISCELLANEOUS) IMPLANT
PUNCH AORTIC ROTATE 5MM 8IN (MISCELLANEOUS) IMPLANT
SET CARDIOPLEGIA MPS 5001102 (MISCELLANEOUS) ×1 IMPLANT
SET IMPELLA CP PUMP (CATHETERS) ×1 IMPLANT
SOLUTION ANTI FOG 6CC (MISCELLANEOUS) IMPLANT
SPONGE LAP 18X18 RF (DISPOSABLE) ×2 IMPLANT
SPONGE LAP 18X18 X RAY DECT (DISPOSABLE) IMPLANT
SPONGE LAP 4X18 RFD (DISPOSABLE) ×4 IMPLANT
SUT BONE WAX W31G (SUTURE) ×5 IMPLANT
SUT ETHIBOND X763 2 0 SH 1 (SUTURE) ×11 IMPLANT
SUT MNCRL AB 3-0 PS2 18 (SUTURE) ×11 IMPLANT
SUT MNCRL AB 4-0 PS2 18 (SUTURE) IMPLANT
SUT PDS AB 1 CTX 36 (SUTURE) ×10 IMPLANT
SUT PROLENE 2 0 SH DA (SUTURE) IMPLANT
SUT PROLENE 3 0 SH DA (SUTURE) ×9 IMPLANT
SUT PROLENE 3 0 SH1 36 (SUTURE) ×5 IMPLANT
SUT PROLENE 4 0 RB 1 (SUTURE) ×50
SUT PROLENE 4 0 SH DA (SUTURE) ×8 IMPLANT
SUT PROLENE 4-0 RB1 .5 CRCL 36 (SUTURE) IMPLANT
SUT PROLENE 4-0 RB1 18X2 ARM (SUTURE) IMPLANT
SUT PROLENE 5 0 C 1 36 (SUTURE) ×6 IMPLANT
SUT PROLENE 6 0 C 1 30 (SUTURE) IMPLANT
SUT PROLENE 7.0 RB 3 (SUTURE) ×15 IMPLANT
SUT PROLENE 8 0 BV175 6 (SUTURE) ×4 IMPLANT
SUT PROLENE BLUE 7 0 (SUTURE) ×5 IMPLANT
SUT PROLENE POLY MONO (SUTURE) IMPLANT
SUT SILK  1 MH (SUTURE) ×7
SUT SILK 1 MH (SUTURE) ×4 IMPLANT
SUT SILK 2 0 SH CR/8 (SUTURE) ×1 IMPLANT
SUT SILK 3 0 TIES 10X30 (SUTURE) ×1 IMPLANT
SUT STEEL 6MS V (SUTURE) IMPLANT
SUT STEEL STERNAL CCS#1 18IN (SUTURE) IMPLANT
SUT STEEL SZ 6 DBL 3X14 BALL (SUTURE) IMPLANT
SUT VIC AB 1 CTX 36 (SUTURE)
SUT VIC AB 1 CTX36XBRD ANBCTR (SUTURE) IMPLANT
SUT VIC AB 2-0 CT1 27 (SUTURE) ×10
SUT VIC AB 2-0 CT1 TAPERPNT 27 (SUTURE) IMPLANT
SUT VIC AB 2-0 CTX 27 (SUTURE) IMPLANT
SUT VIC AB 3-0 SH 27 (SUTURE)
SUT VIC AB 3-0 SH 27X BRD (SUTURE) IMPLANT
SUT VIC AB 3-0 SH 8-18 (SUTURE) ×2 IMPLANT
SUT VIC AB 3-0 X1 27 (SUTURE) IMPLANT
SUT VICRYL 4-0 PS2 18IN ABS (SUTURE) IMPLANT
SYSTEM SAHARA CHEST DRAIN ATS (WOUND CARE) ×5 IMPLANT
TAPE CLOTH SURG 4X10 WHT LF (GAUZE/BANDAGES/DRESSINGS) ×3 IMPLANT
TAPE PAPER 2X10 WHT MICROPORE (GAUZE/BANDAGES/DRESSINGS) ×1 IMPLANT
TOWEL GREEN STERILE (TOWEL DISPOSABLE) ×5 IMPLANT
TOWEL GREEN STERILE FF (TOWEL DISPOSABLE) ×5 IMPLANT
TRAY FOLEY SLVR 16FR TEMP STAT (SET/KITS/TRAYS/PACK) ×5 IMPLANT
TUBE CONNECTING 12X1/4 (SUCTIONS) ×1 IMPLANT
TUBING INSUFFLATION (TUBING) ×5 IMPLANT
UNDERPAD 30X30 (UNDERPADS AND DIAPERS) ×5 IMPLANT
WATER STERILE IRR 1000ML POUR (IV SOLUTION) ×10 IMPLANT
WIRE G V18X300CM (WIRE) ×2 IMPLANT
YANKAUER SUCT BULB TIP NO VENT (SUCTIONS) ×1 IMPLANT

## 2017-11-02 NOTE — Progress Notes (Signed)
I responded to a Code Blue alert and visited the patient's room. I provided assistance to the medical staff by providing contact information for the patient's son. His son was notified of his father's status and is planning to arrive shortly. I gave an update to the [facility] for follow up if needed.     10/29/2017 0800  Clinical Encounter Type  Visited With Patient not available;Health care provider  Visit Type Code  Referral From Nurse  Consult/Referral To Chaplain    Chaplain Dr Redgie Grayer

## 2017-11-02 NOTE — Progress Notes (Signed)
Dr. Roxy Manns ordered blood and FFP to be given to patient emergently due to low BP on arrival and patient continuing to bleed.  FFP hung donor number  [id number] [phone] Y @ [id number].  PRBC hung donor number [id number] [phone] hung at 8403,  2nd FFP donor number [id number] [phone] hung at 1820, 2nd unit PRBC's donor number [id number] [phone] hung at 1830.  Patient tolerated all well.

## 2017-11-02 NOTE — Anesthesia Procedure Notes (Addendum)
Procedure Name: Intubation Date/Time: 10/28/2017 8:20 AM Performed by: Myna Bright, CRNA Pre-anesthesia Checklist: Patient identified, Emergency Drugs available, Suction available and Patient being monitored Patient Re-evaluated:Patient Re-evaluated prior to induction Oxygen Delivery Method: Circle System Utilized Preoxygenation: Pre-oxygenation with 100% oxygen Induction Type: IV induction Ventilation: Mask ventilation without difficulty Laryngoscope Size: Glidescope and 4 Grade View: Grade I Tube type: Subglottic suction tube Tube size: 7.5 mm Number of attempts: 1 Airway Equipment and Method: Stylet and Video-laryngoscopy Placement Confirmation: ETT inserted through vocal cords under direct vision,  positive ETCO2 and breath sounds checked- equal and bilateral Secured at: 23 cm Tube secured with: Tape Dental Injury: Teeth and Oropharynx as per pre-operative assessment

## 2017-11-02 NOTE — Op Note (Addendum)
CARDIOTHORACIC SURGERY OPERATIVE NOTE  Date of Procedure: 11/01/2017  Preoperative Diagnosis:   Left Main Coronary Artery Disease  S/P Ventricular Fibrillation Cardiac Arrest  Cardiogenic Shock  Postoperative Diagnosis: Same  Procedure:    Coronary Artery Bypass Grafting x 1   Left Internal Mammary Artery to Distal Left Anterior Descending Coronary Artery  Endoscopic Vein Harvest from Left Thigh  Clipping of Left Atrial Appendage    Placement of Impella CP Left Ventricular Assist Device  Right axillary artery approach   Surgeon: Valentina Gu. 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  His cardiac history dates back to 2016 when he was found to be in atrial fibrillation by his primary care physician.  He underwent a nuclear stress test at that time that was felt to be low risk.  An echocardiogram revealed preserved left ventricular function with severe left atrial enlargement.  He underwent DC cardioversion but rapidly went back into atrial fibrillation.  He has been managed  with long-term anticoagulation and rate control ever since.  The patient states that he did well but has developed slow progression of symptoms of exertional shortness of breath and fatigue over the last several months culminating in resting shortness of breath with hypoxemia consistent consistent with acute exacerbation of chronic combined systolic and diastolic congestive heart failure.  Baseline EKG revealed atrial fibrillation without acute ST or T wave changes.  Chest x-ray was clear but BNP level was elevated.  Symptoms improved rapidly with intravenous Lasix and oxygen therapy using BiPAP.  Serial troponin levels became slightly elevated peaking at 1.11, consistent with possible non-ST segment elevation myocardial infarction.    Cardiothoracic surgical consultation was requested and the patient originally seen in consultation on October 31, 2017.  The indications, risk, potential benefits of coronary artery bypass grafting and possible Maze procedure were discussed.  The patient's anticoagulation for atrial fibrillation was held and tentative plans made for surgery next week.  At approximately 7 AM this morning the patient developed sudden onset severe substernal chest pain associated with diffuse ST segment elevation on EKG.  Symptoms and signs of ischemia persisted despite elevating doses of intravenous nitroglycerin.  The patient was seen in follow-up and plans made for emergent surgical revascularization.  The patient subsequently suffered V. fib arrest from which he was resuscitated.  He required DC cardioversion x3 and a brief period of CPR.  He woke up briefly following his arrest and appeared to be neurologically intact.  The patient was subsequently intubated in the intensive care unit and taken directly to the operating room for emergent surgical revascularization.     DETAILS OF THE OPERATIVE PROCEDURE  Preparation:  The patient is brought to the operating room  on the above mentioned  date and central monitoring was established by the anesthesia team including placement of Swan-Ganz catheter and left radial arterial line. The patient is placed in the supine position on the operating table.  Intravenous antibiotics are administered. A Foley catheter is placed.  Baseline transesophageal echocardiogram was performed.  Findings were notable for mild to moderate global left ventricular systolic dysfunction with septal hypokinesis.  There was mild to moderate (2+) aortic insufficiency.  The aortic valve is trileaflet.  All 3 leaflets moved well.  The jet of aortic insufficiency was eccentric.  There was mild central mitral regurgitation.  Right ventricular size and function appeared normal.  The patient's chest, abdomen, both groins, and both lower extremities are prepared and draped in a sterile manner. A time out procedure is performed.   Surgical Approach and Conduit Harvest:  A median sternotomy incision was performed and the left internal mammary artery is dissected from the chest wall and prepared for bypass grafting. The left internal mammary artery is notably good quality conduit. Simultaneously, the greater saphenous vein is obtained from the patient's left thigh using endoscopic vein harvest technique.  Initially an incision is made in the patient's right thigh but the vein on the right side is felt to be small.  The saphenous vein removed from the left thigh is notably good quality conduit. After removal of the saphenous vein, the small surgical incisions in the lower extremity are closed with absorbable suture. Following systemic heparinization, the left internal mammary artery was transected distally noted to have excellent flow.   Extracorporeal Cardiopulmonary Bypass and Myocardial Protection:  The pericardium is opened.  Upon entry into the pericardial space it is immediately apparent that there were dense adhesions surrounding the heart and the pericardial space is  essentially obliterated.  Dissection is continued to expose the ascending aorta and the right atrium.  The ascending aorta is cannulated uneventfully.  A two-stage venous cannula is placed through the right atrium.  The right atrial wall is notably very thin and prone to tearing.  Cardiopulmonary bypass was begun.  Further sharp dissection and electrocautery are utilized to mobilize the surface of the heart.  Mobilization around the right atrium and the right ventricle is uneventful.  However, extending across the anterior wall of the left ventricle and around the apex it rapidly becomes apparent that there is no pericardium present on the left side or inferiorly.  The apex of the heart and the distal inferior wall appears to be adhesed directly to the diaphragm.  The anterior wall and the left lateral side of the heart is densely adhesed to fat without any visible pericardium present.  Care is taken to stay along the epicardial surface of the heart and dissection is stopped once the right ventricle was adequately mobilized to facilitate placement of retrograde cardioplegia cannula.  A retrograde cardioplegia cannula is placed through the right atrium into the coronary sinus.   A cardioplegia cannula is placed in the ascending aorta.  A temperature probe was placed in the interventricular septum.  The patient is allowed to cool passively to [facility] systemic temperature.  The aortic cross clamp is applied and cold blood cardioplegia is delivered initially in an antegrade fashion through the aortic root.  Supplemental cardioplegia is given retrograde through the coronary sinus catheter.  Iced saline slush is applied for topical hypothermia.  The initial cardioplegic arrest is rapid with early diastolic arrest.  Myocardial protection was felt to be excellent.  With the heart  arrested dissection is continued to complete mobilization of the entire epicardial surface.  Distal target vessels are selective for bypass  grafting.  Of note, the epicardial branches of the left coronary system are relatively small in diameter and free of any significant atherosclerotic disease.  The terminal branches of the left circumflex are too small for grafting.   Clipping of Left Atrial Appendage:  Left atrial appendage is obliterated using an Atricure left atrial clip (Atriclip Pro245).     Coronary Artery Bypass Grafting:  The distal left anterior coronary artery was grafted with the left internal mammary artery in an end-to-side fashion.  At the site of distal anastomosis the target vessel was relatively small and delicate but otherwise good quality and measured approximately 1.5 mm in diameter.  A 1.5 mm probe easily passed in both directions.  The septal myocardial temperature rose rapidly after reperfusion of the left internal mammary artery graft.  The aortic cross clamp was removed after a total cross clamp time of 53 minutes.  The distal coronary anastomosis was inspected for hemostasis and appropriate graft orientation. Epicardial pacing wires are fixed to the right ventricular inferior wall. The patient is rewarmed to 37C temperature. The patient is weaned and disconnected from cardiopulmonary bypass.  The patient's rhythm at separation from bypass was V-paced.  The patient was weaned from cardiopulmonary bypass on milrinone and low dose levophed. Cardiopulmonary bypass time for the operation was 89 minutes.  Followup transesophageal echocardiogram performed after separation from bypass revealed no significant change in left ventricular function with septal hypokinesis and ejection fraction estimated 45 to 50%.  Right ventricular function appeared normal.  However, cardiac output was below index.  Low-dose epinephrine infusion was added.  The aortic and venous cannula were removed uneventfully. Protamine was administered to reverse the anticoagulation.   Over the next 30 minutes the patient developed slow gradual  decline in left ventricular function with rising pulmonary artery pressures and cardiac index suboptimal.  Epinephrine dose is increased.  Patient developed appeared of hypotension and subsequently V. fib arrest.  The patient was promptly cardioverted but remained profoundly hypotensive.  The patient suffered several more episodes of V. fib arrest.  Open chest CPR was utilized extensively.   Placement of Impella CP Left Ventricular Assist Device:  The patient is re-heparinized by injecting heparin directly into the right atrium during open chest CPR.  The ascending aorta is cannulated for cardio pony bypass.  The right common femoral vein is cannulated with Seldinger technique and a guidewire advanced under TEE guidance through the right atrium.  A 22 x 25 French long femoral venous cannula is advanced under TEE guidance.  Cardiopulmonary bypass is resumed.  A small incision is made in the right deltopectoral groove.  The incision is completed through the subcutaneous tissues with electrocautery.  The pectoralis major muscles are split longitudinally.  The pectoralis minor muscle is retracted laterally.  The deep pectoralis fascia was incised.  A self-retaining retractor is placed.  The right axillary artery is carefully dissected away from associated structures using sharp dissection.  A 10 mm Hemashield platinum vascular graft is sewn to the right axillary artery in end-to-side fashion.  The end of the graft is tunneled to the subcutaneous tissues to a separate stab incision just below the incision in the deltopectoral groove.  The introducing sheath from the Impella CP kit is placed in the end of the vascular graft and de-aired.  A J-wire is passed through the introducing sheath and directed under  fluoroscopic guidance into the ascending aorta through a JR4 catheter.  The aortic valve was crossed uneventfully.  The JR4 catheter was exchanged for a pigtail catheter into the left ventricle.  The guidewire  was removed and exchanged for an Abiomed platinum plus wire designed for placement of the Impella.    An Impella 5.0 ventricular assist device is advanced over the guidewire.  However, the initial guidewire is not stiff enough to facilitate passing the Impella through the subclavian and innominate artery.  The pump was removed and the guidewire exchanged for a V18 wire.  The Impella was then reintroduced over the stiff wire and successfully placed across the aortic valve into the left ventricle under fluoroscopic guidance.  Fine-tuning of the final position of the Impella is performed using transesophageal echocardiography.  The Impella is connected to the console and notably malfunctions.  This Impella and guide were both removed.  The J-wire is replaced and advanced into the aortic root through a JR4 catheter.  The aortic valve was crossed uneventfully and the JR4 catheter removed and exchanged for a pigtail catheter.  A second V18 stiff wire is passed through the pigtail and positioned in the left ventricular apex.  A new Impella CP ventricular assist device is passed over the guidewire and advanced under fluoroscopic guidance across the valve into the left ventricle.  The Impella CP device was chosen because there was no backup Impella 5.0 device available.  Final position of the Impella is guided using transesophageal echocardiogram.  The Impella CP ventricular assist device is turned on and gradually increased to P9 flow.  The patient is subsequently gradually weaned from cardiopulmonary bypass.  Transesophageal echocardiogram is utilized to verify stable position of the Impella catheter and to evaluate right ventricular function.  The patient is weaned from cardio pony bypass after a duration of 132 minutes such that grand total duration of cardio pony bypass for the entire operation is 221 minutes.  The patient is weaned from cardio pony bypass on milrinone at 0.5 mcg/kg/min, epinephrine at 4 mcg/min,  and low-dose levophed.   Procedure Completion:  Protamine is administered to reverse the anticoagulation.  The mediastinum and pleural space were inspected for hemostasis and irrigated with saline solution.  There is severe coagulopathy.  The patient is transfused a total of 6 units fresh frozen plasma, 2 packs of platelets, and one pack of cryoprecipitate.  The patient received 4 units packed red blood cells during the procedure due to anemia which was present preoperatively and exacerbated by acute blood loss and hemodilution during cardiopulmonary bypass.  The incision in the right deltopectoral groove is inspected for hemostasis and subsequently closed in multiple layers.  The mediastinum and both pleural spaces were drained using 4 chest tubes placed through separate stab incisions inferiorly.  The soft tissues anterior to the aorta were reapproximated loosely. The sternum is closed with double strength sternal wire. The soft tissues anterior to the sternum were closed in multiple layers and the skin is closed with a running subcuticular skin closure.   Disposition:  The patient is transported to the surgical intensive care in stable but critical condition. There are no intraoperative complications.  Sponge and instrument counts were incorrect.  A chest x-ray was performed in the operating room prior to transport to verify no residual instruments or sponges.    Valentina Gu. Roxy Manns MD 10/20/2017 5:40 PM

## 2017-11-02 NOTE — Anesthesia Procedure Notes (Signed)
Central Venous Catheter Insertion Performed by: Myrtie Soman, MD, anesthesiologist Start/End10/29/2019 8:40 AM, 11/06/2017 8:55 AM Patient location: OR. Preanesthetic checklist: patient identified, IV checked, site marked, risks and benefits discussed, surgical consent, monitors and equipment checked, pre-op evaluation, timeout performed and anesthesia consent Position: Trendelenburg Lidocaine 1% used for infiltration and patient sedated Hand hygiene performed , maximum sterile barriers used  and Seldinger technique used Catheter size: 9 Fr Total catheter length 10. Central line was placed.MAC introducer Swan type:thermodilution PA Cath depth:48 Procedure performed using ultrasound guided technique. Ultrasound Notes:anatomy identified, needle tip was noted to be adjacent to the nerve/plexus identified, no ultrasound evidence of intravascular and/or intraneural injection and image(s) printed for medical record Attempts: 1 Following insertion, line sutured, dressing applied and Biopatch. Post procedure assessment: blood return through all ports, free fluid flow and no air  Patient tolerated the procedure well with no immediate complications.

## 2017-11-02 NOTE — Anesthesia Procedure Notes (Signed)
Arterial Line Insertion Start/End10/15/2019 8:50 AM, 11/02/2017 9:00 AM Performed by: Lavell Luster, CRNA, CRNA  Preanesthetic checklist: patient identified, IV checked, site marked, risks and benefits discussed, surgical consent, monitors and equipment checked, pre-op evaluation and timeout performed Left, radial was placed Catheter size: 20 G Hand hygiene performed , maximum sterile barriers used  and Seldinger technique used Allen's test indicative of satisfactory collateral circulation Attempts: 1 Procedure performed without using ultrasound guided technique. Following insertion, Biopatch and dressing applied. Post procedure assessment: normal

## 2017-11-02 NOTE — Progress Notes (Signed)
ANTICOAGULATION CONSULT NOTE - Follow-Up Consult  Pharmacy Consult for heparin Indication: atrial fibrillation and CAD awaiting CABG consult  No Known Allergies  Patient Measurements: Height: 5\' 8"  (172.7 cm) Weight: 194 lb 7.1 oz (88.2 kg) IBW/kg (Calculated) : 68.4  Vital Signs: Temp: 98.1 F (36.7 C) (10/20 0724) Temp Source: Oral (10/20 0724) BP: 145/94 (10/20 0748) Pulse Rate: 100 (10/20 0748)  Labs: Recent Labs    10/22/2017 0626 10/27/2017 1406 10/17/2017 1608 11/12/2017 1937 11/01/17 0019 11/01/17 0301 11/01/2017 0222  HGB 11.7*  --   --   --   --  12.8* 12.1*  HCT 37.0*  --   --   --   --  40.0 37.7*  PLT 184  --   --   --   --  200 187  APTT 37*  --  83*  --   --  102* 91*  HEPARINUNFRC 0.75*  --   --   --   --  0.96* 0.81*  CREATININE 0.91  --   --   --   --  1.09 1.13  TROPONINI 1.11* 0.96*  --  0.62* 0.38*  --   --     Estimated Creatinine Clearance: 65.6 mL/min (by C-G formula based on SCr of 1.13 mg/dL).   Medical History: Past Medical History:  Diagnosis Date  . A-fib ([place])   . Adenomatous colon polyp 2003  . CHF (congestive heart failure) ([place])   . Diabetes mellitus   . Hyperlipidemia   . Hypertension     Assessment: 72yo male presents w/ respiratory distress, admitted for CHF exacerbation, on unit pt because diaphoretic and c/o CP >> EKG suggested acute MI and code STEMI called, sent emergently to cardiac cath lab where left main was found to be stenosed > TCTS consult planned CABG soon.  Pt is on chronic Xarelto therapy for Afib w/ last dose taken 10/16 pm - this falsely elevates HL - will use aptt to dose heparin for now.   APTT at goal this AM.  No overt bleeding or complications noted.    Went into [facility] this AM, heparin gtt stopped > planning emergent CABG.  Goal of Therapy:  Heparin level 0.3-0.7 units/ml aPTT 66-102 seconds Monitor platelets by anticoagulation protocol: Yes   Plan:  F/u s/p CABG.  Marguerite Olea,  [facility] Clinical Pharmacist Phone [phone]  11/01/2017 8:28 AM

## 2017-11-02 NOTE — Progress Notes (Addendum)
Patient ID: Charles Bailey, male   DOB: [birth year], 72 y.o.   MRN: [id number]   Progress Note  Patient Name: Charles Bailey Date of Encounter: 11/08/2017  Primary Cardiologist: No primary care provider on file.   Subjective   Recurrence of 5/10 chest pain this morning, easing off with increasing NTG gtt.  No chest pain during the day yesterday.     Inpatient Medications    Scheduled Meds: . amLODipine  10 mg Oral Daily  . aspirin  81 mg Oral Daily  . atorvastatin  80 mg Oral q1800  . cloNIDine  0.2 mg Oral BID  . insulin aspart  0-9 Units Subcutaneous TID WC  . lisinopril  40 mg Oral Daily  . metoprolol succinate  75 mg Oral Daily  . omega-3 acid ethyl esters  1,000 mg Oral Daily  . sodium chloride flush  3 mL Intravenous Q12H   Continuous Infusions: . sodium chloride    . heparin 1,200 Units/hr (11/08/2017 0725)  . nitroGLYCERIN 70 mcg/min (10/22/2017 0725)   PRN Meds: sodium chloride, acetaminophen, diazepam, morphine injection, ondansetron **OR** ondansetron (ZOFRAN) IV, ondansetron (ZOFRAN) IV, sodium chloride, sodium chloride flush, white petrolatum   Vital Signs    Vitals:   10/22/2017 0600 10/21/2017 0640 11/06/2017 0700 10/29/2017 0724  BP:  139/79 (!) 175/79 (!) 147/99  Pulse: (!) 54 (!) 39 85 85  Resp: (!) 22 20 19 19   Temp:  97.8 F (36.6 C)    TempSrc:  Oral    SpO2: 94% 92% 94% 93%  Weight:      Height:        Intake/Output Summary (Last 24 hours) at 10/23/2017 0729 Last data filed at 11/13/2017 0725 Gross per 24 hour  Intake 1944.88 ml  Output 1750 ml  Net 194.88 ml   Filed Weights   10/28/2017 1800 10/14/2017 0500 10/31/2017 0500  Weight: 88.3 kg 85.4 kg 88.2 kg    Telemetry    Atrial fibrillation in 60s  - Personally Reviewed   Physical Exam   General: NAD Neck: Thick, no JVD, no thyromegaly or thyroid nodule.  Lungs: Clear to auscultation bilaterally with normal respiratory effort. CV: Nondisplaced PMI.  Heart regular S1/S2, no S3/S4, 1/6 SEM RUSB.   No peripheral edema.   Abdomen: Soft, nontender, no hepatosplenomegaly, no distention.  Skin: Intact without lesions or rashes.  Neurologic: Alert and oriented x 3.  Psych: Normal affect. Extremities: No clubbing or cyanosis.  HEENT: Normal.   Labs    Chemistry Recent Labs  Lab 10/19/2017 2330 10/20/2017 0626 11/01/17 0301 11/12/2017 0222  NA  --  138 139 137  K  --  3.6 3.8 3.8  CL  --  103 99 99  CO2  --  26 29 30   GLUCOSE  --  208* 178* 178*  BUN  --  15 20 21   CREATININE  --  0.91 1.09 1.13  CALCIUM  --  7.7* 8.8* 8.9  PROT 7.1  --   --   --   ALBUMIN 3.9  --   --   --   AST 17  --   --   --   ALT 15  --   --   --   ALKPHOS 66  --   --   --   BILITOT 0.5  --   --   --   GFRNONAA  --  >60 >60 >60  GFRAA  --  >60 >60 >60  ANIONGAP  --  9 11 8      Hematology Recent Labs  Lab 10/27/2017 0626 11/01/17 0301 10/14/2017 0222  WBC 7.0 7.1 7.4  RBC 4.04* 4.37 4.12*  HGB 11.7* 12.8* 12.1*  HCT 37.0* 40.0 37.7*  MCV 91.6 91.5 91.5  MCH 29.0 29.3 29.4  MCHC 31.6 32.0 32.1  RDW 13.6 13.5 13.6  PLT 184 200 187    Cardiac Enzymes Recent Labs  Lab 10/27/2017 0626 10/28/2017 1406 11/08/2017 1937 11/01/17 0019  TROPONINI 1.11* 0.96* 0.62* 0.38*   No results for input(s): TROPIPOC in the last 168 hours.   BNP Recent Labs  Lab 11/07/2017 1144  BNP 729.7*     DDimer No results for input(s): DDIMER in the last 168 hours.   Radiology    No results found.  Cardiac Studies   Normal nuclear stress test 01/28/2014.  Echocardiogram 10/21/2017: Study Conclusions - Left ventricle: The cavity size was normal. Wall thickness was   increased in a pattern of mild LVH. Systolic function was mildly   reduced. The estimated ejection fraction was in the range of 45%   to 50%. Anteroseptal hypokinesis. The study is not technically   sufficient to allow evaluation of LV diastolic function. - Aortic valve: Sclerosis without stenosis. There was mild   regurgitation. - Mitral valve:  Mildly thickened leaflets . There was mild   regurgitation. - Left atrium: Severely dilated. - Right ventricle: The cavity size was mildly dilated. Mildly   reduced systolic function. - Right atrium: Moderately dilated. - Atrial septum: No defect or patent foramen ovale was identified   by color doppler and saline microbubble contrast. - Tricuspid valve: There was mild regurgitation. - Pulmonary arteries: PA peak pressure: 48 mm Hg (S). - Inferior vena cava: The vessel was normal in size. The   respirophasic diameter changes were in the normal range (>= 50%),   consistent with normal central venous pressure.  Impressions: - Compared to a prior study in 01/2017, the LVEF is lower at 45-50%   with anteroseptal hypokinesis. Negative for right to left shunt   by bubble study.  LHC:    Mid LM to Dist LM lesion is 70% stenosed.  LV end diastolic pressure is normal.   Smooth 70% mid left main stenosis with mild haziness which did not improve following intracoronary nitroglycerin.  Otherwise normal-appearing LAD, left circumflex, and dominant RCA.  With dynamic marked ECG changes and up to 3 to 4 mm of ST-T segment elevation transiently in lead aVR with diffuse global ischemic changes, suspect significant transient vasospasm attributing to his marked ECG abnormalities.  Low normal global LV function with an ejection fraction of 50% and mild hypocontractility in the mid distal anterolateral wall most likely due to transient stunned myocardium from transient left main coronary vasospasm. LVEDP 13 mm Hg  RECOMMENDATION: The patient's last dose of Xarelto was the evening of October 29, 2016.  We will continue IV nitroglycerin at current rate 50 mcg.  Plan to heparinize patient for 4 - 5 hours post sheath removal.  ECG post procedure shows complete resolution of prior dynamic ECG changes.  Recommend surgical consultation for consideration of CABG revascularization.  Recommend Aspirin 81mg   daily for CAD and will not institute P2Y12 inhibition due to need for probable CABG revascularization.    Diagnostic Diagram         Patient Profile     72 y.o. male hypertension, hyperlipidemia, diabetes mellitus type 2, paroxysmal atrial fibrillation on rivaroxaban, abdominal aortic aneurysm status-post repair 2007  was admitted with NSTEMI.   Assessment & Plan    1. CAD:  NSTEMI with cath showing 70% hypodense distal LM stenosis.  The remainder of his coronary anatomy is free of significant disease.  Intracoronary nitroglycerin was administered which did not change the characteristics of the lesion suggesting that it was not spasm.  Plan at this time is CABG on 10/23.  He re-developed CP this morning, easing off with increasing NTG. - CABG 10/23.  - Titrate up NTG as needed to keep pain-free, he has BP room.   - Continue statin, ASA, beta blocker. Will give Toprol XL early today.  - Heparin gtt.   - ECG, recheck troponin.   2. Acute primarily diastolic CHF: Echo showed EF 45-50% with anteroseptal hypokinesis. Volume status looks ok, not short of breath. Got IV Lasix yesterday.  - Transition Lasix to 40 mg po daily.  - Continue lisinopril. - Continue Toprol XL 75 mg daily. HR in 50s at times, will not increase.   2. Hypertension: Controlled on current regimen.   3. Hyperlipidemia: Continue high intensity statin.   4. Chronic atrial fibrillation: CHA2DS2VASc score of 4. On Xarelto at home, last dose 10/16. Will hold DOAC and continue heparin gtt in anticipation for surgery next week.  - Plan for [place] with CABG.   5. T2DM: Well controlled. On SSI.   6. Aortic insufficiency: I reviewed his echo, aortic insufficiency looks more in the moderate range.  It is eccentric.  The LV is not dilated.   For questions or updates, please contact [place] Please consult [email or web address] for contact info under   Signed, Loralie Champagne, MD  11/09/2017, 7:29 AM    Repeat ECG shows  anterior and lateral ST elevation, marked change from 10/18. He has ongoing chest pain, turning NTG gtt up to 150 mcg/min.  Will discuss with Dr. Roxy Manns, suspect patient is going to need urgent either percutaneous or surgical intervention => will go to OR now for CABG.   Loralie Champagne 10/27/2017 7:52 AM  Patient went into ventricular fibrillation.  He was defibrillated x 3 total and received epinephrine and amiodarone bolus with restoration of perfusing rhythm (atrial fibrillation).  Patient going directly to OR.  CRITICAL CARE Performed by: Loralie Champagne  Total critical care time: 45 minutes  Critical care time was exclusive of separately billable procedures and treating other patients.  Critical care was necessary to treat or prevent imminent or life-threatening deterioration.  Critical care was time spent personally by me on the following activities: development of treatment plan with patient and/or surrogate as well as nursing, discussions with consultants, evaluation of patient's response to treatment, examination of patient, obtaining history from patient or surrogate, ordering and performing treatments and interventions, ordering and review of laboratory studies, ordering and review of radiographic studies, pulse oximetry and re-evaluation of patient's condition.  Loralie Champagne 11/07/2017 8:16 AM

## 2017-11-02 NOTE — Progress Notes (Signed)
Code blue called on patient. ACLS protocol in place upon RT arrival. Patient was intubated by anesthesia and ROSC obtained. The patient was then taken to OR.

## 2017-11-02 NOTE — Progress Notes (Signed)
      [address]       [place] [address]             [phone]        CARDIOTHORACIC SURGERY PROGRESS NOTE   R2 Days Post-Op Procedure(s) (LRB): LEFT HEART CATH AND CORONARY ANGIOGRAPHY (N/A)  Subjective: Patient has developed SSCP radiating to left arm associated w/ SOB despite high dose IV nitroglycerine and heparin.  Pain has eased some but persists despite escalating NTG and IV morphine  Objective: Vital signs: BP Readings from Last 1 Encounters:  11/12/2017 (!) 145/94   Pulse Readings from Last 1 Encounters:  10/28/2017 100   Resp Readings from Last 1 Encounters:  11/03/2017 (!) 22   Temp Readings from Last 1 Encounters:  11/06/2017 98.1 F (36.7 C) (Oral)    Hemodynamics:    Physical Exam:  Rhythm:   Afib  Breath sounds: Diminished at bases  Heart sounds:  irregular  Incisions:  n/a  Abdomen:  soft  Extremities:  warm   Intake/Output from previous day: 10/19 0701 - 10/20 0700 In: 1932.7 [P.O.:1302; I.V.:630.7] Out: 1550 [Urine:1550] Intake/Output this shift: Total I/O In: 28.8 [I.V.:28.8] Out: 200 [Urine:200]  Lab Results:  CBC: Recent Labs    11/01/17 0301 10/25/2017 0222  WBC 7.1 7.4  HGB 12.8* 12.1*  HCT 40.0 37.7*  PLT 200 187    BMET:  Recent Labs    11/01/17 0301 11/03/2017 0222  NA 139 137  K 3.8 3.8  CL 99 99  CO2 29 30  GLUCOSE 178* 178*  BUN 20 21  CREATININE 1.09 1.13  CALCIUM 8.8* 8.9     PT/INR:  No results for input(s): LABPROT, INR in the last 72 hours.  CBG (last 3)  Recent Labs    11/01/17 1558 11/01/17 2133 11/06/2017 0654  GLUCAP 184* 150* 159*    ABG No results found for: PHART, PCO2ART, PO2ART, HCO3, TCO2, ACIDBASEDEF, O2SAT  CXR: N/A  EKG: Afib w/ diffuse ST segment elevation across precordium  Assessment/Plan: S/P Procedure(s) (LRB): LEFT HEART CATH AND CORONARY ANGIOGRAPHY (N/A)  Patient has severe left main CAD w/ unstable post-infarction angina despite escalating doses of IV  NTG.  Last dose of Xarelto was approximately 72 hours ago.  Although patient may still be at increased risk of bleeding, I think we should proceed directly to OR for emergency CABG.  Discussed with patient who agrees.  All questions answered.    Rexene Alberts, MD 10/19/2017 8:03 AM

## 2017-11-02 NOTE — Anesthesia Procedure Notes (Signed)
Anesthesia Procedure Image    

## 2017-11-02 NOTE — Progress Notes (Signed)
At 0700 Patient c/o 5/10 chest pain with radiation to left arm. He described the feeling in his arm as heavy and weakness. His nitro drip was increased. Patient did not have any relief with increase in nitro drip. Stat EKG done- Dr. Aundra Dubin visualized EKG and orders obtained to increase nitro drip, give morphine, and get repeat EKG in 10 mins and draw troponin. Orders completed, patient remained without improvement in pain and was noted to be diaphoretic. Repeat EKG done and Dr. Aundra Dubin visualized EKG and states he will speak with Dr. Roxy Manns. See flowsheet and MAR for vital signs, detailed pain rating, and medication documentation.

## 2017-11-02 NOTE — Code Documentation (Signed)
T[facility] BRIEF CODE DOCUMENTATION NOTE  2 Days Post-Op  S/P Procedure(s) (LRB): LEFT HEART CATH AND CORONARY ANGIOGRAPHY (N/A)   Patient developed sudden onset Vfib arrest requiring a brief period of CPR, DC cardioversion x3, and 1 amp Epinephrine.  He woke up and followed commands and currently remains stable in Afib w/ stable HR and BP.  He was subsequently intubated uneventfully by Anesthesia team for acute respiratory failure.  Plan: Proceed directly to OR for emergency CABG w/ clipping of LA appendage, TEE, possible AVR.  I spoke with the patient's son Jonni Sanger over the telephone and updated him of the events and plans.  All questions answered.  Rexene Alberts, MD 11/12/2017 8:22 AM

## 2017-11-02 NOTE — Transfer of Care (Signed)
Immediate Anesthesia Transfer of Care Note  Patient: Charles Bailey  Procedure(s) Performed: CORONARY ARTERY BYPASS GRAFTING (CABG) x 1, ON PUMP, LIMA TO LAD, USING LEFT INTERNAL MAMMARY ARTERY, HARVESTED LEFT GREATER SAPHENOUS VEIN ENDOSCOPICALLY (N/A Chest) TRANSESOPHAGEAL ECHOCARDIOGRAM (TEE) (N/A ) CLIPPING OF ATRIAL APPENDAGE USING ATRICLIP PRO2 45 (Chest) PLACEMENT OF IMPELLA LEFT VENTRICULAR ASSIST DEVICE  Patient Location: ICU  Anesthesia Type:General  Level of Consciousness: sedated, unresponsive and Patient remains intubated per anesthesia plan  Airway & Oxygen Therapy: Patient remains intubated per anesthesia plan and Patient placed on Ventilator (see vital sign flow sheet for setting)  Post-op Assessment: Report given to RN and Post -op Vital signs reviewed and stable  Post vital signs: Reviewed and stable  Last Vitals:  Vitals Value Taken Time  BP 85/72 10/26/2017  5:37 PM  Temp    Pulse 84 11/10/2017  5:38 PM  Resp 17 11/03/2017  5:38 PM  SpO2 98 % 11/08/2017  5:38 PM  Vitals shown include unvalidated device data.  Last Pain:  Vitals:   11/09/2017 0748  TempSrc:   PainSc: 4       Patients Stated Pain Goal: 0 (56/25/63 8937)  Complications: No apparent anesthesia complications

## 2017-11-02 NOTE — Progress Notes (Signed)
Dr. Aundra Dubin in unit to see patient.  Patient c/o 5/10 chest pain.  Patient very diaphoretic, clammy and stating that his left arm feels heavy.  BP elevated.  Patient already on 165mcg of NTG currently.  Dr. Aundra Dubin asked RN to give patient 2mg  of MSO4-which was given.  Patient continued to c/o chest pain that was 3-4/10.  Dr. Aundra Dubin asked RN to give patient another one time dose of 2mg  MSO4.  Dr. Roxy Manns notified of patient having active chest pain by Dr. Aundra Dubin.  On way over to see patient.  Shortly after Dr. Roxy Manns came into room and spoke with Dr. Aundra Dubin and the patient, patient went into Vfib arrest for which we shocked him and started CPR.  Please see code sheet and Dr. Roxy Manns and Dr. Claris Gladden notes for code.  Patient's son Jonni Sanger notified and made aware that patient was going to the OR for emergency Open Heart Surgery.  Both son's arrived after patient had left and Dr. Aundra Dubin spoke with son's and updated them on his condition prior to going to OR.

## 2017-11-02 NOTE — Plan of Care (Signed)
  Problem: Cardiovascular: Goal: Vascular access site(s) Level 0-1 will be maintained Outcome: Progressing   Problem: Respiratory: Goal: Respiratory status will improve Outcome: Progressing   Problem: Skin Integrity: Goal: Wound healing without signs and symptoms of infection Outcome: Progressing   Problem: Urinary Elimination: Goal: Ability to achieve and maintain adequate renal perfusion and functioning will improve Outcome: Progressing

## 2017-11-02 NOTE — Progress Notes (Signed)
..  EKG CRITICAL VALUE     12 lead EKG performed.  Critical value noted.  Cipriano Mile, RN notified.   Ihor Gully, CCT 10/26/2017 7:42 AM

## 2017-11-02 NOTE — Anesthesia Preprocedure Evaluation (Signed)
Anesthesia Evaluation  Patient identified by MRN, date of birth, ID band Patient awake    Reviewed: Allergy & Precautions, NPO status , Patient's Chart, lab work & pertinent test results  Airway Mallampati: II  TM Distance: >3 FB Neck ROM: Full    Dental no notable dental hx.    Pulmonary neg pulmonary ROS, former smoker,    Pulmonary exam normal breath sounds clear to auscultation       Cardiovascular hypertension, + CAD, + Past MI and +CHF  + dysrhythmias Atrial Fibrillation  Rhythm:Irregular Rate:Normal     Neuro/Psych negative neurological ROS  negative psych ROS   GI/Hepatic negative GI ROS, Neg liver ROS,   Endo/Other  diabetes  Renal/GU negative Renal ROS  negative genitourinary   Musculoskeletal negative musculoskeletal ROS (+)   Abdominal   Peds negative pediatric ROS (+)  Hematology negative hematology ROS (+)   Anesthesia Other Findings   Reproductive/Obstetrics negative OB ROS                             Anesthesia Physical Anesthesia Plan  ASA: IV and emergent  Anesthesia Plan: General   Post-op Pain Management:    Induction: Intravenous  PONV Risk Score and Plan: 0  Airway Management Planned: Oral ETT  Additional Equipment: Arterial line, CVP, PA Cath, TEE and Ultrasound Guidance Line Placement  Intra-op Plan:   Post-operative Plan: Post-operative intubation/ventilation  Informed Consent: I have reviewed the patients History and Physical, chart, labs and discussed the procedure including the risks, benefits and alternatives for the proposed anesthesia with the patient or authorized representative who has indicated his/her understanding and acceptance.   Dental advisory given  Plan Discussed with: CRNA and Surgeon  Anesthesia Plan Comments:         Anesthesia Quick Evaluation

## 2017-11-02 NOTE — Brief Op Note (Addendum)
11/07/2017 - 10/21/2017  5:31 PM  PATIENT:  Charles Bailey  72 y.o. male  PRE-OPERATIVE DIAGNOSIS:  EMERGENT CABG CAD  POST-OPERATIVE DIAGNOSIS:  CABG   PROCEDURE:  Procedure(s): CORONARY ARTERY BYPASS GRAFTING x 1 -LIMA to LEFT ANTERIOR DESCENDING  ENDOSCOPIC HARVEST GREATER SAPHENOUS VEIN -Left Thigh -Right Thigh explored no suitable conduit found  PLACEMENT OF IMPELLA CP LEFT VENTRICULAR ASSIST DEVICE  TRANSESOPHAGEAL ECHOCARDIOGRAM (TEE) (N/A)  CLIPPING OF ATRIAL APPENDAGE USING ATRICLIP PRO2 45  SURGEON:  Surgeon(s) and Role:    Rexene Alberts, MD - Primary  PHYSICIAN ASSISTANT: Ellwood Handler PA-C  ANESTHESIA:   general  EBL:  2500 mL   BLOOD ADMINISTERED: CELLSAVER  DRAINS: Left Pleural Chest Tubes, Mediastinal Chest Drains   LOCAL MEDICATIONS USED:  NONE  SPECIMEN:  No Specimen  DISPOSITION OF SPECIMEN:  N/A  COUNTS:  YES  TOURNIQUET:  * No tourniquets in log *  DICTATION: .Dragon Dictation  PLAN OF CARE: Admit to inpatient   PATIENT DISPOSITION:  ICU - intubated and hemodynamically stable.   Delay start of Pharmacological VTE agent (>24hrs) due to surgical blood loss or risk of bleeding: yes

## 2017-11-02 NOTE — Progress Notes (Addendum)
   11/07/2017 1500  Clinical Encounter Type  Visited With Family;Health care provider  Visit Type Initial;Spiritual support;Critical Care;Patient in surgery  Referral From Other (Comment) ([employer] engineer)  Spiritual Encounters  Spiritual Needs Grief support;Emotional;Prayer  Stress Factors  Patient Stress Factors Health changes  Family Stress Factors Family relationships;Loss of control   Responded to request from [facility] secretary to meet w/ family in consult room as the pt's status in surgery was critical.  Met w/ about six family members.  Empathetic listening, also learned that the pt's wife died about 1.5 years ago after 14 month health crisis (neuro tumor I believe).  Facilitated lament.   At family's request, prayed w/ them.  Was present when APP provided update to about a dozen family members.  Left afterwards to give family privacy, let them know to request chaplain through staff person if desired further support.  Myra Gianotti resident, [phone]

## 2017-11-03 ENCOUNTER — Inpatient Hospital Stay (HOSPITAL_COMMUNITY): Payer: PPO

## 2017-11-03 ENCOUNTER — Encounter (HOSPITAL_COMMUNITY): Payer: Self-pay | Admitting: Thoracic Surgery (Cardiothoracic Vascular Surgery)

## 2017-11-03 ENCOUNTER — Encounter (HOSPITAL_COMMUNITY): Payer: PPO

## 2017-11-03 ENCOUNTER — Other Ambulatory Visit (HOSPITAL_COMMUNITY): Payer: PPO

## 2017-11-03 DIAGNOSIS — I5031 Acute diastolic (congestive) heart failure: Secondary | ICD-10-CM

## 2017-11-03 DIAGNOSIS — J9601 Acute respiratory failure with hypoxia: Secondary | ICD-10-CM

## 2017-11-03 LAB — PREPARE PLATELET PHERESIS
UNIT DIVISION: 0
UNIT DIVISION: 0

## 2017-11-03 LAB — POCT I-STAT 3, ART BLOOD GAS (G3+)
Acid-Base Excess: 1 mmol/L (ref 0.0–2.0)
Acid-base deficit: 7 mmol/L — ABNORMAL HIGH (ref 0.0–2.0)
Bicarbonate: 23 mmol/L (ref 20.0–28.0)
Bicarbonate: 25.8 mmol/L (ref 20.0–28.0)
O2 SAT: 98 %
O2 SAT: 98 %
PH ART: 7.16 — AB (ref 7.350–7.450)
PO2 ART: 140 mmHg — AB (ref 83.0–108.0)
TCO2: 25 mmol/L (ref 22–32)
TCO2: 27 mmol/L (ref 22–32)
pCO2 arterial: 64.4 mmHg — ABNORMAL HIGH (ref 32.0–48.0)
pCO2 arterial: 41.2 mmHg (ref 32.0–48.0)
pH, Arterial: 7.405 (ref 7.350–7.450)
pO2, Arterial: 111 mmHg — ABNORMAL HIGH (ref 83.0–108.0)

## 2017-11-03 LAB — GLUCOSE, CAPILLARY
GLUCOSE-CAPILLARY: 100 mg/dL — AB (ref 70–99)
GLUCOSE-CAPILLARY: 105 mg/dL — AB (ref 70–99)
GLUCOSE-CAPILLARY: 112 mg/dL — AB (ref 70–99)
GLUCOSE-CAPILLARY: 120 mg/dL — AB (ref 70–99)
GLUCOSE-CAPILLARY: 129 mg/dL — AB (ref 70–99)
GLUCOSE-CAPILLARY: 136 mg/dL — AB (ref 70–99)
GLUCOSE-CAPILLARY: 141 mg/dL — AB (ref 70–99)
GLUCOSE-CAPILLARY: 74 mg/dL (ref 70–99)
GLUCOSE-CAPILLARY: 91 mg/dL (ref 70–99)
GLUCOSE-CAPILLARY: 98 mg/dL (ref 70–99)
Glucose-Capillary: 151 mg/dL — ABNORMAL HIGH (ref 70–99)
Glucose-Capillary: 166 mg/dL — ABNORMAL HIGH (ref 70–99)
Glucose-Capillary: 119 mg/dL — ABNORMAL HIGH (ref 70–99)
Glucose-Capillary: 101 mg/dL — ABNORMAL HIGH (ref 70–99)
Glucose-Capillary: 100 mg/dL — ABNORMAL HIGH (ref 70–99)
Glucose-Capillary: 134 mg/dL — ABNORMAL HIGH (ref 70–99)
Glucose-Capillary: 124 mg/dL — ABNORMAL HIGH (ref 70–99)
Glucose-Capillary: 129 mg/dL — ABNORMAL HIGH (ref 70–99)
Glucose-Capillary: 134 mg/dL — ABNORMAL HIGH (ref 70–99)
Glucose-Capillary: 139 mg/dL — ABNORMAL HIGH (ref 70–99)
Glucose-Capillary: 154 mg/dL — ABNORMAL HIGH (ref 70–99)
Glucose-Capillary: 119 mg/dL — ABNORMAL HIGH (ref 70–99)
Glucose-Capillary: 145 mg/dL — ABNORMAL HIGH (ref 70–99)

## 2017-11-03 LAB — BPAM FFP
BLOOD PRODUCT EXPIRATION DATE: 201910252359
BLOOD PRODUCT EXPIRATION DATE: 201910252359
BLOOD PRODUCT EXPIRATION DATE: 201910252359
Blood Product Expiration Date: 201910252359
Blood Product Expiration Date: 201910252359
Blood Product Expiration Date: 201910252359
Blood Product Expiration Date: 201910252359
Blood Product Expiration Date: 201910252359
Blood Product Expiration Date: 201910252359
Blood Product Expiration Date: 201910252359
ISSUE DATE / TIME: 201910201510
ISSUE DATE / TIME: 201910201510
ISSUE DATE / TIME: 201910201510
ISSUE DATE / TIME: 201910201510
ISSUE DATE / TIME: 201910201647
ISSUE DATE / TIME: 201910201647
ISSUE DATE / TIME: 201910201809
UNIT TYPE AND RH: 5100
UNIT TYPE AND RH: 5100
UNIT TYPE AND RH: 5100
UNIT TYPE AND RH: 5100
UNIT TYPE AND RH: 9500
Unit Type and Rh: 5100
Unit Type and Rh: 5100
Unit Type and Rh: 5100
Unit Type and Rh: 5100
Unit Type and Rh: 5100

## 2017-11-03 LAB — PREPARE FRESH FROZEN PLASMA
UNIT DIVISION: 0
UNIT DIVISION: 0
UNIT DIVISION: 0
Unit division: 0
Unit division: 0
Unit division: 0
Unit division: 0
Unit division: 0
Unit division: 0

## 2017-11-03 LAB — CBC WITH DIFFERENTIAL/PLATELET
ABS IMMATURE GRANULOCYTES: 0.09 10*3/uL — AB (ref 0.00–0.07)
BASOS PCT: 0 %
Basophils Absolute: 0 10*3/uL (ref 0.0–0.1)
EOS ABS: 0 10*3/uL (ref 0.0–0.5)
Eosinophils Relative: 0 %
HCT: 31.1 % — ABNORMAL LOW (ref 39.0–52.0)
Hemoglobin: 10.5 g/dL — ABNORMAL LOW (ref 13.0–17.0)
Immature Granulocytes: 1 %
Lymphocytes Relative: 9 %
Lymphs Abs: 0.9 10*3/uL (ref 0.7–4.0)
MCH: 29.6 pg (ref 26.0–34.0)
MCHC: 33.8 g/dL (ref 30.0–36.0)
MCV: 87.6 fL (ref 80.0–100.0)
MONO ABS: 1.1 10*3/uL — AB (ref 0.1–1.0)
MONOS PCT: 11 %
Neutro Abs: 7.9 10*3/uL — ABNORMAL HIGH (ref 1.7–7.7)
Neutrophils Relative %: 79 %
PLATELETS: 120 10*3/uL — AB (ref 150–400)
RBC: 3.55 MIL/uL — ABNORMAL LOW (ref 4.22–5.81)
RDW: 15.2 % (ref 11.5–15.5)
WBC: 10 10*3/uL (ref 4.0–10.5)
nRBC: 0 % (ref 0.0–0.2)

## 2017-11-03 LAB — POCT I-STAT EG7
Acid-base deficit: 7 mmol/L — ABNORMAL HIGH (ref 0.0–2.0)
Bicarbonate: 18.8 mmol/L — ABNORMAL LOW (ref 20.0–28.0)
CALCIUM ION: 1.03 mmol/L — AB (ref 1.15–1.40)
HEMATOCRIT: 21 % — AB (ref 39.0–52.0)
HEMOGLOBIN: 7.1 g/dL — AB (ref 13.0–17.0)
O2 Saturation: 56 %
PCO2 VEN: 40.5 mmHg — AB (ref 44.0–60.0)
POTASSIUM: 3.9 mmol/L (ref 3.5–5.1)
Sodium: 134 mmol/L — ABNORMAL LOW (ref 135–145)
TCO2: 20 mmol/L — AB (ref 22–32)
pH, Ven: 7.275 (ref 7.250–7.430)
pO2, Ven: 33 mmHg (ref 32.0–45.0)

## 2017-11-03 LAB — POCT I-STAT, CHEM 8
BUN: 23 mg/dL (ref 8–23)
BUN: 22 mg/dL (ref 8–23)
CALCIUM ION: 1.14 mmol/L — AB (ref 1.15–1.40)
CALCIUM ION: 1.04 mmol/L — AB (ref 1.15–1.40)
CHLORIDE: 99 mmol/L (ref 98–111)
CREATININE: 1.5 mg/dL — AB (ref 0.61–1.24)
Chloride: 103 mmol/L (ref 98–111)
Creatinine, Ser: 1.2 mg/dL (ref 0.61–1.24)
Glucose, Bld: 398 mg/dL — ABNORMAL HIGH (ref 70–99)
Glucose, Bld: 140 mg/dL — ABNORMAL HIGH (ref 70–99)
HEMATOCRIT: 37 % — AB (ref 39.0–52.0)
HEMATOCRIT: 28 % — AB (ref 39.0–52.0)
HEMOGLOBIN: 9.5 g/dL — AB (ref 13.0–17.0)
Hemoglobin: 12.6 g/dL — ABNORMAL LOW (ref 13.0–17.0)
Potassium: 3.9 mmol/L (ref 3.5–5.1)
Potassium: 3.8 mmol/L (ref 3.5–5.1)
SODIUM: 140 mmol/L (ref 135–145)
Sodium: 137 mmol/L (ref 135–145)
TCO2: 24 mmol/L (ref 22–32)
TCO2: 25 mmol/L (ref 22–32)

## 2017-11-03 LAB — COOXEMETRY PANEL
CARBOXYHEMOGLOBIN: 1.6 % — AB (ref 0.5–1.5)
Carboxyhemoglobin: 1.3 % (ref 0.5–1.5)
METHEMOGLOBIN: 1.2 % (ref 0.0–1.5)
Methemoglobin: 1.8 % — ABNORMAL HIGH (ref 0.0–1.5)
O2 SAT: 63.6 %
O2 SAT: 59.2 %
TOTAL HEMOGLOBIN: 10.2 g/dL — AB (ref 12.0–16.0)
Total hemoglobin: 10.3 g/dL — ABNORMAL LOW (ref 12.0–16.0)

## 2017-11-03 LAB — CBC
HCT: 35.3 % — ABNORMAL LOW (ref 39.0–52.0)
HCT: 33.8 % — ABNORMAL LOW (ref 39.0–52.0)
HCT: 31 % — ABNORMAL LOW (ref 39.0–52.0)
Hemoglobin: 11.3 g/dL — ABNORMAL LOW (ref 13.0–17.0)
Hemoglobin: 11 g/dL — ABNORMAL LOW (ref 13.0–17.0)
Hemoglobin: 10 g/dL — ABNORMAL LOW (ref 13.0–17.0)
MCH: 28.8 pg (ref 26.0–34.0)
MCH: 28.8 pg (ref 26.0–34.0)
MCH: 28.5 pg (ref 26.0–34.0)
MCHC: 32 g/dL (ref 30.0–36.0)
MCHC: 32.5 g/dL (ref 30.0–36.0)
MCHC: 32.3 g/dL (ref 30.0–36.0)
MCV: 89.8 fL (ref 80.0–100.0)
MCV: 88.5 fL (ref 80.0–100.0)
MCV: 88.3 fL (ref 80.0–100.0)
NRBC: 0.4 % — AB (ref 0.0–0.2)
PLATELETS: 116 10*3/uL — AB (ref 150–400)
PLATELETS: 121 10*3/uL — AB (ref 150–400)
PLATELETS: 100 10*3/uL — AB (ref 150–400)
RBC: 3.93 MIL/uL — ABNORMAL LOW (ref 4.22–5.81)
RBC: 3.82 MIL/uL — AB (ref 4.22–5.81)
RBC: 3.51 MIL/uL — ABNORMAL LOW (ref 4.22–5.81)
RDW: 14.6 % (ref 11.5–15.5)
RDW: 15.1 % (ref 11.5–15.5)
RDW: 15.8 % — ABNORMAL HIGH (ref 11.5–15.5)
WBC: 11.3 10*3/uL — AB (ref 4.0–10.5)
WBC: 9.7 10*3/uL (ref 4.0–10.5)
WBC: 11.4 10*3/uL — AB (ref 4.0–10.5)
nRBC: 0 % (ref 0.0–0.2)
nRBC: 0.2 % (ref 0.0–0.2)

## 2017-11-03 LAB — BPAM CRYOPRECIPITATE
Blood Product Expiration Date: 201910202058
ISSUE DATE / TIME: 201910201524
UNIT TYPE AND RH: 5100

## 2017-11-03 LAB — BPAM PLATELET PHERESIS
BLOOD PRODUCT EXPIRATION DATE: 201910212359
BLOOD PRODUCT EXPIRATION DATE: 201910212359
ISSUE DATE / TIME: 201910201446
ISSUE DATE / TIME: 201910201446
UNIT TYPE AND RH: 6200
Unit Type and Rh: 5100

## 2017-11-03 LAB — POCT ACTIVATED CLOTTING TIME
Activated Clotting Time: 136 seconds
Activated Clotting Time: 147 seconds

## 2017-11-03 LAB — PREPARE CRYOPRECIPITATE: UNIT DIVISION: 0

## 2017-11-03 LAB — BASIC METABOLIC PANEL
Anion gap: 7 (ref 5–15)
BUN: 21 mg/dL (ref 8–23)
CALCIUM: 7.5 mg/dL — AB (ref 8.9–10.3)
CO2: 23 mmol/L (ref 22–32)
Chloride: 109 mmol/L (ref 98–111)
Creatinine, Ser: 1.45 mg/dL — ABNORMAL HIGH (ref 0.61–1.24)
GFR calc non Af Amer: 47 mL/min — ABNORMAL LOW (ref >60)
GFR, EST AFRICAN AMERICAN: 55 mL/min — AB (ref >60)
Glucose, Bld: 143 mg/dL — ABNORMAL HIGH (ref 70–99)
POTASSIUM: 3.6 mmol/L (ref 3.5–5.1)
SODIUM: 139 mmol/L (ref 135–145)

## 2017-11-03 LAB — ECHOCARDIOGRAM LIMITED
Height: 68 in
Weight: 3481.5 oz

## 2017-11-03 LAB — LACTATE DEHYDROGENASE: LDH: 1003 U/L — AB (ref 98–192)

## 2017-11-03 LAB — MAGNESIUM: MAGNESIUM: 2.7 mg/dL — AB (ref 1.7–2.4)

## 2017-11-03 LAB — CREATININE, SERUM
Creatinine, Ser: 1.59 mg/dL — ABNORMAL HIGH (ref 0.61–1.24)
GFR calc Af Amer: 49 mL/min — ABNORMAL LOW (ref >60)
GFR calc non Af Amer: 42 mL/min — ABNORMAL LOW (ref >60)

## 2017-11-03 LAB — LACTIC ACID, PLASMA: Lactic Acid, Venous: 2 mmol/L (ref 0.5–1.9)

## 2017-11-03 MED ORDER — EPINEPHRINE PF 1 MG/ML IJ SOLN
0.0000 ug/min | INTRAVENOUS | Status: DC
  Filled 2017-11-03: qty 4

## 2017-11-03 MED ORDER — HEPARIN SODIUM (PORCINE) 5000 UNIT/ML IJ SOLN
50000.0000 [IU] | INTRAVENOUS | Status: DC
  Administered 2017-11-03: 50000 [IU]
  Filled 2017-11-03: qty 10

## 2017-11-03 MED ORDER — ATORVASTATIN CALCIUM 80 MG PO TABS
80.0000 mg | ORAL_TABLET | Freq: Every day | ORAL | Status: DC
  Administered 2017-11-03: 80 mg
  Filled 2017-11-03: qty 1

## 2017-11-03 MED ORDER — HYPROMELLOSE (GONIOSCOPIC) 2.5 % OP SOLN
1.0000 [drp] | Freq: Four times a day (QID) | OPHTHALMIC | Status: DC
  Filled 2017-11-03 (×2): qty 15

## 2017-11-03 MED ORDER — NOREPINEPHRINE 16 MG/250ML-% IV SOLN
0.0000 ug/min | INTRAVENOUS | Status: DC
  Administered 2017-11-03: 17 ug/min via INTRAVENOUS
  Administered 2017-11-04: 25 ug/min via INTRAVENOUS
  Filled 2017-11-03 (×3): qty 250

## 2017-11-03 MED ORDER — POTASSIUM CHLORIDE 10 MEQ/50ML IV SOLN
10.0000 meq | INTRAVENOUS | Status: AC
  Administered 2017-11-03 (×3): 10 meq via INTRAVENOUS
  Filled 2017-11-03 (×2): qty 50

## 2017-11-03 MED ORDER — FUROSEMIDE 10 MG/ML IJ SOLN
15.0000 mg/h | INTRAVENOUS | Status: DC
  Administered 2017-11-03 – 2017-11-04 (×2): 10 mg/h via INTRAVENOUS
  Filled 2017-11-03 (×2): qty 25
  Filled 2017-11-03: qty 21

## 2017-11-03 MED ORDER — POTASSIUM CHLORIDE 10 MEQ/50ML IV SOLN
10.0000 meq | INTRAVENOUS | Status: AC
  Administered 2017-11-03 (×2): 10 meq via INTRAVENOUS

## 2017-11-03 MED ORDER — POLYVINYL ALCOHOL 1.4 % OP SOLN
1.0000 [drp] | Freq: Four times a day (QID) | OPHTHALMIC | Status: DC
  Administered 2017-11-03 – 2017-11-04 (×5): 1 [drp] via OPHTHALMIC
  Filled 2017-11-03: qty 15

## 2017-11-03 MED ORDER — POTASSIUM CHLORIDE 10 MEQ/50ML IV SOLN
INTRAVENOUS | Status: AC
  Administered 2017-11-03: 10 meq via INTRAVENOUS
  Filled 2017-11-03: qty 100

## 2017-11-03 MED ORDER — DEXMEDETOMIDINE HCL IN NACL 400 MCG/100ML IV SOLN
0.0000 ug/kg/h | INTRAVENOUS | Status: DC
  Administered 2017-11-03: 0.8 ug/kg/h via INTRAVENOUS
  Administered 2017-11-03 – 2017-11-04 (×6): 0.7 ug/kg/h via INTRAVENOUS
  Filled 2017-11-03 (×7): qty 100

## 2017-11-03 NOTE — Consult Note (Addendum)
Advanced Heart Failure Team Consult Note   Primary Physician: Susy Frizzle, MD PCP-Cardiologist:  No primary care provider on file.  Reason for Consultation: cardiogenic shock  HPI:    Charles Bailey is seen today for evaluation of cardiogenic shock at the request of Dr Roxy Manns.   Charles Bailey is a 72 y.o. male with a history of HTN, PAF on xarelto, hyperlipidemia, DM2, and AAA.  Presented to [facility] 10/21/2017 with SOB x 2 days + BLE edema. He was hypoxic and required BiPAP. He was in rate controlled afib. CXR negative, but BNP elevated to 729 so he was given IV lasix.   He developed chest pain on 10/18 with EKG changes (ST elevations in I and aVL with diffuse depressions), so cardiology was consulted. Code STEMI called. LHC completed and showed normal coronaries other than 70% left main stenosis with mild haziness. Suspected significant transient vasospasm caused ST changes on EKG. Intracoronary nitro did not change the lesion (suggesting not spasm). EF 50%. Surgical consult was recommended.   Dr Roxy Manns was consulted on 10/18 and started preop work up for CABG. Scheduled for CABG with MAZE 10/23. He did not tolerate nitro wean with recurrent CP.   On 10/20, He had marked EKG changes with anterior and lateral ST elevations with ongoing chest pain despite increased nitro drip. He then had a Vfib arrest. He received 3 shocks, epi, and amio. He went directly to OR emergently after ROSC.  Impella CP was placed. He underwent emergent CABG + clipping of LA appendage 10/19/2017. He received 6u FFP, 2 packs platelets, 1 pack cryo, and 4 u PRBCs intraoperatively.   He remains intubated and sedated. FiO2 60%  Has Impella CP (5.0 unavailable yesterday). Had suction events this morning. Echo showed small effusion but good positioning. Turned up to P8, but had another suction event so turned back down to P7. Flow 2.9.   Minimal UOP overnight. Lasix drip ordered for today. Coox 64%. Creatinine 1.45.  Weight up 8 lbs. MAPs 90s.  Hemoglobin 10.5, platelets 120  Drips:  Epi @ 3 Milrinone @ 0.5 Phenyl @ 5 Amio @ 30 Norepi @ 12 Lasix drip @ 10 mg/hr ordered for this morning.   Swan #s: PA: 41/24 (32) CVP: 20 CO: 3.7 CI: 1.8 Co-ox 64%  Impella P9 3.5 L/min  Cardiac Studies:  TEE 11/01/2017: EF 45-50%, septal hypokinesis, RV appeared normal, CO low.  LHC 10/1817:  Mid LM to Dist LM lesion is 70% stenosed.  LV end diastolic pressure is normal.   Smooth 70% mid left main stenosis with mild haziness which did not improve following intracoronary nitroglycerin.  Otherwise normal-appearing LAD, left circumflex, and dominant RCA.  With dynamic marked ECG changes and up to 3 to 4 mm of ST-T segment elevation transiently in lead aVR with diffuse global ischemic changes, suspect significant transient vasospasm attributing to his marked ECG abnormalities.  Low normal global LV function with an ejection fraction of 50% and mild hypocontractility in the mid distal anterolateral wall most likely due to transient stunned myocardium from transient left main coronary vasospasm. LVEDP 13 mm Hg  RECOMMENDATION: The patient's last dose of Xarelto was the evening of October 29, 2016.  We will continue IV nitroglycerin at current rate 50 mcg.  Plan to heparinize patient for 4 - 5 hours post sheath removal.  ECG post procedure shows complete resolution of prior dynamic ECG changes.  Recommend surgical consultation for consideration of CABG revascularization.  Recommend Aspirin  47m daily for CAD and will not institute P2Y12 inhibition due to need for probable CABG revascularization.  Review of systems complete and found to be negative unless listed in HPI.   Home Medications Prior to Admission medications   Medication Sig Start Date End Date Taking? Authorizing Provider  amLODipine (NORVASC) 10 MG tablet Take 1 tablet by mouth once daily (need to be seen before any further refills) Patient  taking differently: Take 10 mg by mouth daily.  02/17/17  Yes PSusy Frizzle MD  Cholecalciferol (VITAMIN D3) 1000 UNITS CAPS Take 2,000 Units by mouth daily.    Yes [provider]  cloNIDine (CATAPRES) 0.2 MG tablet Take 1 tablet by mouth twice a day Patient taking differently: Take 0.2 mg by mouth 2 (two) times daily.  06/18/17  Yes PSusy Frizzle MD  fish oil-omega-3 fatty acids 1000 MG capsule Take 1,200 mg by mouth daily.   Yes [provider]  furosemide (LASIX) 40 MG tablet TAKE 1 TABLET BY MOUTH EVERY DAY Patient taking differently: Take 40 mg by mouth daily as needed (swelling).  06/17/17  Yes Pickard, WCammie Mcgee MD  KLOR-CON M20 20 MEQ tablet TAKE 1 TABLET BY MOUTH EVERY DAY Patient taking differently: Take 20 mEq by mouth daily as needed (when taking furosemide).  06/17/17  Yes PSusy Frizzle MD  lisinopril (PRINIVIL,ZESTRIL) 40 MG tablet Take 1 tablet by mouth every day Patient taking differently: Take 40 mg by mouth daily.  05/05/17  Yes PSusy Frizzle MD  metFORMIN (GLUCOPHAGE) 1000 MG tablet Take 1 tablet by mouth twice a day with meals Patient taking differently: Take 1,000 mg by mouth 2 (two) times daily with a meal.  08/19/17  Yes PSusy Frizzle MD  metoprolol tartrate (LOPRESSOR) 50 MG tablet Take 1 tablet by mouth twice a day Patient taking differently: Take 50 mg by mouth 2 (two) times daily.  08/29/17  Yes PSusy Frizzle MD  pravastatin (PRAVACHOL) 40 MG tablet Take 1 tablet by mouth daily Patient taking differently: Take 40 mg by mouth every evening.  04/09/17  Yes PSusy Frizzle MD  rivaroxaban (XARELTO) 20 MG TABS tablet Take 1 tablet (20 mg total) by mouth daily with supper. 10/21/17  Yes PSusy Frizzle MD    Past Medical History: Past Medical History:  Diagnosis Date  . [place]   . Adenomatous colon polyp 2003  . CHF (congestive heart failure) ([place]   . Diabetes mellitus   . Hyperlipidemia   . Hypertension     Past  Surgical History: Past Surgical History:  Procedure Laterality Date  . ABDOMINAL AORTIC ANEURYSM REPAIR  2007  . CARDIOVERSION N/A 03/29/2014   Procedure: CARDIOVERSION;  Surgeon: PJosue Hector MD;  [place] [facility]  Service: Cardiovascular;  Laterality: N/A;  . INGUINAL HERNIA REPAIR Right 1968  . LEFT HEART CATH AND CORONARY ANGIOGRAPHY N/A 10/19/2017   Procedure: LEFT HEART CATH AND CORONARY ANGIOGRAPHY;  Surgeon: KTroy Sine MD;  [facility];  Service: Cardiovascular;  Laterality: N/A;    Family History: Family History  Problem Relation Age of Onset  . Heart disease Father   . Heart failure Mother        died in sleep at 858years   . Diabetes Paternal Aunt   . Heart disease Paternal Uncle   . Heart disease Paternal Grandfather     Social History: Social History   Socioeconomic History  . Marital status: Married  Spouse name: Not on file  . Number of children: Not on file  . Years of education: Not on file  . Highest education level: Not on file  Occupational History  . Not on file  Social Needs  . Financial resource strain: Not on file  . Food insecurity:    Worry: Not on file    Inability: Not on file  . Transportation needs:    Medical: Not on file    Non-medical: Not on file  Tobacco Use  . Smoking status: Former Smoker    Years: 15.00    Types: Cigarettes    Last attempt to quit: 03/03/2005    Years since quitting: 12.6  . Smokeless tobacco: Never Used  Substance and Sexual Activity  . Alcohol use: Yes    Alcohol/week: 4.0 standard drinks    Types: 4 Cans of beer per week  . Drug use: No  . Sexual activity: Not on file  Lifestyle  . Physical activity:    Days per week: Not on file    Minutes per session: Not on file  . Stress: Not on file  Relationships  . Social connections:    Talks on phone: Not on file    Gets together: Not on file    Attends religious service: Not on file    Active member of club or  organization: Not on file    Attends meetings of clubs or organizations: Not on file    Relationship status: Not on file  Other Topics Concern  . Not on file  Social History Narrative  . Not on file    Allergies:  No Known Allergies  Objective:    Vital Signs:   Temp:  [97.2 F (36.2 C)-99.5 F (37.5 C)] 98.6 F (37 C) (10/21 0700) Pulse Rate:  [25-128] 99 (10/21 0700) Resp:  [13-33] 20 (10/21 0700) BP: (72-192)/(58-177) 110/82 (10/21 0700) SpO2:  [84 %-100 %] 99 % (10/21 0700) Arterial Line BP: (67-121)/(59-83) 107/76 (10/21 0700) FiO2 (%):  [40 %-100 %] 60 % (10/21 0700) Weight:  [88.2 kg-98.7 kg] 98.7 kg (10/21 0245) Last BM Date: 10/14/2017  Weight change: Filed Weights   11/05/2017 1730 11/12/2017 1900 11/03/17 0245  Weight: 88.2 kg 95 kg 98.7 kg    Intake/Output:   Intake/Output Summary (Last 24 hours) at 11/03/2017 0730 Last data filed at 11/03/2017 0700 Gross per 24 hour  Intake 16098.35 ml  Output 4762 ml  Net 11336.35 ml      Physical Exam    General: Intubated/sedated.  HEENT: + ETT Neck: Supple. JVP elevated. Carotids 2+ bilat; no bruits. No thyromegaly or nodule noted. Cor: PMI nondisplaced. IRR, No M/G/R noted. Right chest impella in place. Chest tube x4. Pacing wires present. Lungs: Diminished basilar sounds. Occasional wheeze Abdomen: Soft, non-tender, non-distended, no HSM. No bruits or masses. +BS  Extremities: No cyanosis, clubbing, or rash. Trace ankle edema.  Neuro: Intubated/sedated   Telemetry   Afib 100s. Personally reviewed.   EKG    Afib 100 bpm with RBBB. Mild ST elevation aVL V2-V3, ST depression III, aVF. Personally reviewed.   Labs   Basic Metabolic Panel: Recent Labs  Lab 11/01/2017 1144 11/05/2017 0626 11/01/17 0301 11/11/2017 0222 11/06/2017 0754  10/29/2017 1358 10/14/2017 1518 10/27/2017 1651 11/10/2017 1752 10/29/2017 2259 11/03/17 0303  NA 135 138 139 137  --    < > 140 143 146* 146* 141 139  K 3.9 3.6 3.8 3.8  --    < >  4.7  3.9 3.1* 2.9* 3.9 3.6  CL 95* 103 99 99  --    < > 102 103 101  --  106 109  CO2 26 26 29 30   --   --   --   --   --   --   --  23  GLUCOSE 249* 208* 178* 178*  --    < > 257* 212* 178* 173* 190* 143*  BUN 18 15 20 21   --    < > 18 23 20   --  22 21  CREATININE 0.98 0.91 1.09 1.13  --    < > 1.00 1.10 1.20  --  1.40* 1.45*  CALCIUM 9.3 7.7* 8.8* 8.9  --   --   --   --   --   --   --  7.5*  MG  --   --  1.7  --  1.8  --   --   --   --   --   --  2.7*   < > = values in this interval not displayed.    Liver Function Tests: Recent Labs  Lab 10/16/2017 2330  AST 17  ALT 15  ALKPHOS 66  BILITOT 0.5  PROT 7.1  ALBUMIN 3.9   No results for input(s): LIPASE, AMYLASE in the last 168 hours. No results for input(s): AMMONIA in the last 168 hours.  CBC: Recent Labs  Lab 10/22/2017 1144  10/22/2017 0222  11/10/2017 1420  11/06/2017 1610 10/26/2017 1651 11/06/2017 1752 11/12/2017 1953 10/15/2017 2259 11/03/17 0303  WBC 10.8*   < > 7.4  --   --   --  13.7*  --   --  11.3* 9.7 10.0  NEUTROABS 8.1*  --   --   --   --   --  11.0*  --   --   --   --  7.9*  HGB 14.7   < > 12.1*   < > 9.5*   < > 10.0* 9.2* 8.5* 11.3* 11.0*  10.2* 10.5*  HCT 46.4   < > 37.7*   < > 28.8*   < > 31.0* 27.0* 25.0* 35.3* 33.8*  30.0* 31.1*  MCV 93.2   < > 91.5  --   --   --  90.9  --   --  89.8 88.5 87.6  PLT 234   < > 187   < > 98*  --  149*  --   --  116* 121* 120*   < > = values in this interval not displayed.    Cardiac Enzymes: Recent Labs  Lab 11/09/2017 0626 10/28/2017 1406 11/09/2017 1937 11/01/17 0019 11/09/2017 0754  TROPONINI 1.11* 0.96* 0.62* 0.38* 0.11*    BNP: BNP (last 3 results) Recent Labs    12/31/16 1652 02/27/17 1143 10/14/2017 1144  BNP 377* 323* 729.7*    ProBNP (last 3 results) No results for input(s): PROBNP in the last 8760 hours.   CBG: Recent Labs  Lab 11/03/17 0052 11/03/17 0157 11/03/17 0302 11/03/17 0356 11/03/17 0454  GLUCAP 166* 151* 136* 119* 120*    Coagulation  Studies: Recent Labs    10/29/2017 1610 11/03/2017 1953  LABPROT 20.2* 18.3*  INR 1.75 1.54     Imaging   Dg Chest 1 View  Result Date: 11/01/2017 CLINICAL DATA:  Incorrect instrument count. EXAM: CHEST  1 VIEW COMPARISON:  Single-view of the chest earlier today. FINDINGS: No unexpected radiopaque foreign body is identified. Endotracheal tube is in place  with the tip in good position at the level of the clavicular heads. Bilateral chest tubes are seen. Right IJ approach Swan-Ganz catheter tip is in the distal right main pulmonary artery. Tip of an Impella device projects in the left ventricle. Left atrial appendage clip is noted. No pneumothorax is identified. Hazy opacity is present throughout the left chest. The right lung is clear. No pneumothorax. IMPRESSION: Negative for retained surgical instrument. Support apparatus as described.  No pneumothorax. Hazy opacity throughout the left chest could be due to diffuse atelectasis and/or layering pleural effusion. Electronically Signed   By: Inge Rise M.D.   On: 10/31/2017 17:43   Dg Chest Port 1 View  Result Date: 11/01/2017 CLINICAL DATA:  Intubated, chest tube EXAM: PORTABLE CHEST 1 VIEW COMPARISON:  Chest radiograph from earlier today. FINDINGS: Endotracheal tube tip is 2.1 cm above the carina. Enteric tube terminates in the proximal stomach. Right internal jugular Swan-Ganz catheter terminates over the main pulmonary artery. Right axillary Impella device terminates over left ventricle. Left atrial appendage clip overlies the left mediastinum. Intact sternotomy wires. Biapical chest tubes and mediastinal drain are in place. Stable cardiomediastinal silhouette with mild cardiomegaly. No pneumothorax. No pleural effusion. Mildly improved aeration in the left lung. Persistent volume loss and asymmetric opacity throughout the left lung. Clear right lung. IMPRESSION: 1. No pneumothorax.  Well-positioned support structures as detailed. 2. Stable  mild cardiomegaly. 3. Improved aeration in the left lung. Persistent volume loss and asymmetric opacity throughout the left lung, which could represent atelectasis or asymmetric pulmonary edema. Electronically Signed   By: Ilona Sorrel M.D.   On: 10/19/2017 18:58   Dg Chest Port 1 View  Result Date: 11/11/2017 CLINICAL DATA:  Chest pain.  Preop cardiovascular exam. EXAM: PORTABLE CHEST 1 VIEW COMPARISON:  One-view chest x-ray 11/08/2017. FINDINGS: Patient is intubated. Endotracheal tube terminates 2.5 cm above the carina. Defibrillator pads are in place. Left CP angle is not included on this study. Segmental atelectasis is present in the left. Aortic atherosclerosis is noted. IMPRESSION: 1. Cardiomegaly without failure. 2. Segmental atelectasis of the left lung. 3. Endotracheal tube terminates 2.5 cm above the carina. Electronically Signed   By: San Morelle M.D.   On: 10/14/2017 11:15   Dg Fluoro Guide Cv Line-no Report  Result Date: 10/15/2017 Fluoroscopy was utilized by the requesting physician.  No radiographic interpretation.      Medications:     Current Medications: . acetaminophen  1,000 mg Oral Q6H   Or  . acetaminophen (TYLENOL) oral liquid 160 mg/5 mL  1,000 mg Per Tube Q6H  . aspirin EC  325 mg Oral Daily   Or  . aspirin  324 mg Per Tube Daily  . chlorhexidine  15 mL Mouth/Throat NOW  . chlorhexidine gluconate (MEDLINE KIT)  15 mL Mouth Rinse BID  . insulin regular  0-10 Units Intravenous TID WC  . mouth rinse  15 mL Mouth Rinse 10 times per day  . sodium chloride flush  3 mL Intravenous Q12H     Infusions: . sodium chloride 20 mL/hr at 11/03/17 0700  . sodium chloride    . sodium chloride 10 mL/hr (10/22/2017 2215)  . amiodarone 30 mg/hr (11/03/17 0700)  . cefUROXime (ZINACEF)  IV    . dexmedetomidine (PRECEDEX) IV infusion 0.7 mcg/kg/hr (11/03/17 0700)  . dextrose 5 % Impella 5.0 Purge solution    . DOPamine    . EPINEPHrine 4 mg in dextrose 5% 250 mL  infusion (16 mcg/mL) 3  mcg/min (11/03/17 0700)  . famotidine (PEPCID) IV 20 mg (10/29/2017 1835)  . furosemide (LASIX) infusion    . insulin 5.4 mL/hr at 11/03/17 0700  . lactated ringers    . lactated ringers 10 mL/hr at 11/03/17 0700  . milrinone 0.5 mcg/kg/min (11/03/17 0700)  . nitroGLYCERIN    . norepinephrine (LEVOPHED) Adult infusion 12 mcg/min (11/03/17 0700)  . phenylephrine (NEO-SYNEPHRINE) Adult infusion 5 mcg/min (11/03/17 0700)       Patient Profile   Charles Bailey is a 72 y.o. male with a history of HTN, PAF on xarelto, hyperlipidemia, DM2, and AAA.  Admitted with SOB. LHC with 70% LAD lesion. CABG scheduled, but pt developed CP and ST changes > then VF arrest. He was taken emergently for CABG 10/20 with impella CP (5.0 not available) placement.  Assessment/Plan   1. Cardiogenic shock  - Continue impella CP support + milrinone 0.5 mcg/kg/min.  - Continue norepinephrine, phenylephrine, and epi. MAPs 90s. - Coox 64% with CI 1.8. Impella P7 with flow 2.9. - Agree with lasix drip @ 10 mg/hr - Check lactate.   2. CAD s/p CABG 10/26/2017 - Continue ASA  3. PAF s/p LA appendage clipping - Continue IV amio - Not currently on any AC. Will need to start heparin at least through purge soon. Per surgery.  4. VF arrest - In setting of STEMI - Continue IV amio. Monitor electrolytes closely.  5. Acute respiratory failure - Continue vent support. FiO2 60%. - ABG pH 7.4, pCO2 41, pO2 111, Bicarb 26   Medication concerns reviewed with patient and pharmacy team. Barriers identified: unable to determine at this time.   Length of Stay: [place], NP  11/03/2017, 7:30 AM  Advanced Heart Failure Team Pager [phone] (M-F; [place])  Please contact [facility] for night-coverage after hours (4p -7a ) and weekends on [email or web address]  Patient seen with NP, agree with the above note.    This he had suction events overnight and Impella turned down to P7 with flow down to  3.0 L/min.  Under echo guidance, Impella catheter was advanced about 1 cm.  We were able to turn up to P9 with flow 3.5 L/min.    The echo showed hyperkinesis of the LV base with severe hypokinesis of the LV apex and peri-apical segments.  EF in the 25-30% range.  Mild RV systolic dysfunction, mild RV dilation.   Co-ox good at 64% but CI low on Swan around 1.8.  We turned off phenylephrine this morning.  MAP stable.  CVP ranging 15-20, starting Lasix gtt at 10 mg/hr.   He is waking up on vent, per nursing follows commands.   On exam, JVP elevated.  Decreased BS lung bases. Impella sounds heard in chest.  Trace ankle edema.  Mildly distended abdomen.   Assessment/Plan: 1. CAD: S/p emergent CABG on 10/20/2017 with anterior STEMI and vfib arrest.  Cath several days prior had showed 70% hazy LM stenosis.  Patient had LIMA-LAD, unable to graft LCx (small vessels).  - Continue ASA and statin.  2. Atrial fibrillation: Chronic.  Patient did not have Maze procedure due to instability.  He remains in rate-controlled atrial fibrillation.  Will need eventual resumption of oral anticoagulation.  3. Vfib arrest: In setting of anterior MI.  - Continue amiodarone gtt for now.  4. Cardiogenic shock: Echo reviewed today, hyperkinetic LV base with severe hypokinesis of the apex and peri-apical segments, EF 25-30% range.  RV function relatively preserved.  Impella position adjusted this morning, now at P9 with 3.5 L/min flow.  Cardiac index remains low on Swan at 1.8 but co-ox 64%.  MAP stable.   - Keep Impella at P9, so far no suction after device advanced.  Suspect that the Impella was initially not far enough in, leading to underfilling at the hyperkinetic LV base and suction events, this corrected with advancing the device.  Follow LDH.  For now, running heparin in purge solution and no systemic heparin.  Will start systemic heparin when stable post-op per Dr. Roxy Manns.  - Starting Lasix gtt at 10 mg/hr, goal to keep  CVP around 10-11 with Impella in place.  - Continue current milrinone and epinephrine.  Now off phenylephrine.  Wean norepinephrine as able.  5. Anemia: Peri-op blood loss.  Follow for hemolysis with Impella, will get daily LDH.  6. Thrombocytopenia: Mild.  Follow carefully with Impella.  7. Hypoxemic respiratory failure: Pulmonary edema.  Intubated, diurese.   CRITICAL CARE Performed by: Loralie Champagne  Total critical care time: 50 minutes  Critical care time was exclusive of separately billable procedures and treating other patients.  Critical care was necessary to treat or prevent imminent or life-threatening deterioration.  Critical care was time spent personally by me on the following activities: development of treatment plan with patient and/or surrogate as well as nursing, discussions with consultants, evaluation of patient's response to treatment, examination of patient, obtaining history from patient or surrogate, ordering and performing treatments and interventions, ordering and review of laboratory studies, ordering and review of radiographic studies, pulse oximetry and re-evaluation of patient's condition.   Loralie Champagne 11/03/2017 10:02 AM

## 2017-11-03 NOTE — Progress Notes (Signed)
  Echocardiogram 2D Echocardiogram limited impella/ effusion check has been performed.  [name] M 11/03/2017, 8:06 AM

## 2017-11-03 NOTE — Progress Notes (Signed)
ANTICOAGULATION CONSULT NOTE - Initial Consult  Pharmacy Consult for heparin with Impella CP Indication: Impella  No Known Allergies  Patient Measurements: Height: 5\' 8"  (172.7 cm) Weight: 217 lb 9.5 oz (98.7 kg) IBW/kg (Calculated) : 68.4 Heparin Dosing Weight: 86.3 kg  Vital Signs: Temp: 99.5 F (37.5 C) (10/21 1400) BP: 95/63 (10/21 1400) Pulse Rate: 37 (10/21 1400)  Labs: Recent Labs    11/07/2017 1937 11/01/17 0019  11/01/17 0301 10/29/2017 0222 10/17/2017 0754  11/01/2017 1610 10/16/2017 1651  10/23/2017 1953 10/19/2017 2259 11/03/17 0303  HGB  --   --    < > 12.8* 12.1*  --    < > 10.0* 9.2*   < > 11.3* 11.0*  10.2* 10.5*  HCT  --   --    < > 40.0 37.7*  --    < > 31.0* 27.0*   < > 35.3* 33.8*  30.0* 31.1*  PLT  --   --    < > 200 187  --    < > 149*  --   --  116* 121* 120*  APTT  --   --   --  102* 91*  --   --  47*  --   --  92*  --   --   LABPROT  --   --   --   --   --   --   --  20.2*  --   --  18.3*  --   --   INR  --   --   --   --   --   --   --  1.75  --   --  1.54  --   --   HEPARINUNFRC  --   --   --  0.96* 0.81*  --   --   --   --   --   --   --   --   CREATININE  --   --    < > 1.09 1.13  --    < >  --  1.20  --   --  1.40* 1.45*  TROPONINI 0.62* 0.38*  --   --   --  0.11*  --   --   --   --   --   --   --    < > = values in this interval not displayed.    Estimated Creatinine Clearance: 54 mL/min (A) (by C-G formula based on SCr of 1.45 mg/dL (H)).   Medical History: Past Medical History:  Diagnosis Date  . A-fib ([place])   . Adenomatous colon polyp 2003  . CHF (congestive heart failure) ([place])   . Diabetes mellitus   . Hyperlipidemia   . Hypertension     Medications:  Infusions:  . sodium chloride Stopped (11/03/17 1000)  . sodium chloride    . sodium chloride 10 mL/hr at 11/03/17 1100  . amiodarone 30 mg/hr (11/03/17 1400)  . cefUROXime (ZINACEF)  IV    . dexmedetomidine (PRECEDEX) IV infusion 0.7 mcg/kg/hr (11/03/17 1400)  . DOPamine    .  EPINEPHrine 4 mg in dextrose 5% 250 mL infusion (16 mcg/mL) 3 mcg/min (11/03/17 1400)  . famotidine (PEPCID) IV Stopped (11/03/17 1030)  . furosemide (LASIX) infusion 10 mg/hr (11/03/17 1400)  . impella catheter heparin 50 unit/mL in dextrose 5%    . insulin 6.4 mL/hr at 11/03/17 1400  . lactated ringers    . lactated ringers 10 mL/hr at 11/03/17 1400  .  milrinone 0.5 mcg/kg/min (11/03/17 1400)  . nitroGLYCERIN    . norepinephrine (LEVOPHED) Adult infusion 10 mcg/min (11/03/17 1400)  . phenylephrine (NEO-SYNEPHRINE) Adult infusion Stopped (11/03/17 4315)    Assessment: 72 yo male on Impella CP s/p CABG, on heparin in purge solution only.  APTT  = 136.  Due to bleeding in OR, Dr. Roxy Manns does not yet want to add systemic heparin.  Monitoring CT output.  Based on purge flow rate of 14.2 ml/hr, he is receiving heparin 710 units/hr of heparin.  No overt bleeding or complications noted.  Hgb 10.5.  Pltc low but fairly stable.  Goal of Therapy:  ACT < 150 Monitor platelets by anticoagulation protocol: Yes   Plan:  For now, planning to continue IV heparin in purge only. Monitor pump flows, may need to add systemic heparin eventually per Impella protocol.  Marguerite Olea, [facility] Clinical Pharmacist Phone [phone]  11/03/2017 2:31 PM

## 2017-11-03 NOTE — Progress Notes (Signed)
      [address]       [place] [address]             [phone]      Intubated, sedated BP 93/73   Pulse (!) 130   Temp 99.9 F (37.7 C)   Resp 14   Ht 5\' 8"  (1.727 m)   Wt 98.7 kg   SpO2 94%   BMI 33.09 kg/m \ CO= 3.9, CI 1.9  Intake/Output Summary (Last 24 hours) at 11/03/2017 2128 Last data filed at 11/03/2017 2100 Gross per 24 hour  Intake 8725.6 ml  Output 2587 ml  Net 6138.6 ml   Co-ox 59 this evening  Continue current care  Steven C. Roxan Hockey, MD [facility] [phone]

## 2017-11-03 NOTE — Progress Notes (Signed)
ANTICOAGULATION CONSULT NOTE - Follow Up Consult  Pharmacy Consult for Heparin with Impella CP Indication: Impella  No Known Allergies  Patient Measurements: Height: 5\' 8"  (172.7 cm) Weight: 217 lb 9.5 oz (98.7 kg) IBW/kg (Calculated) : 68.4 Heparin Dosing Weight: 86 kg  Vital Signs: Temp: 99.9 F (37.7 C) (10/21 1830) BP: 105/87 (10/21 1800) Pulse Rate: 51 (10/21 1830)  Labs: Recent Labs    10/29/2017 1937 11/01/17 0019  11/01/17 0301 10/21/2017 0222 11/12/2017 0754  10/20/2017 1610  10/16/2017 1953 11/08/2017 2259 11/03/17 0303 11/03/17 1629 11/03/17 1638  HGB  --   --    < > 12.8* 12.1*  --    < > 10.0*   < > 11.3* 11.0*  10.2* 10.5*  --  10.0*  9.5*  HCT  --   --    < > 40.0 37.7*  --    < > 31.0*   < > 35.3* 33.8*  30.0* 31.1*  --  31.0*  28.0*  PLT  --   --    < > 200 187  --    < > 149*  --  116* 121* 120*  --  100*  APTT  --   --    < > 102* 91*  --   --  47*  --  92*  --   --   --   --   LABPROT  --   --   --   --   --   --   --  20.2*  --  18.3*  --   --   --   --   INR  --   --   --   --   --   --   --  1.75  --  1.54  --   --   --   --   HEPARINUNFRC  --   --   --  0.96* 0.81*  --   --   --   --   --   --   --   --   --   CREATININE  --   --    < > 1.09 1.13  --    < >  --    < >  --  1.40* 1.45* 1.59* 1.50*  TROPONINI 0.62* 0.38*  --   --   --  0.11*  --   --   --   --   --   --   --   --    < > = values in this interval not displayed.    Estimated Creatinine Clearance: 52.2 mL/min (A) (by C-G formula based on SCr of 1.5 mg/dL (H)).  Assessment:  72 yo male on Impella CP s/p CABG, on heparin in purge solution only.  Due to bleeding in OR, Dr. Roxy Manns does not yet want to add systemic heparin.  Monitoring CT output.   Initial ACT 136 ~12n;  ACT 147 ~6pm.   Based on purge flow rate of 14.1 ml/hr, he is receiving heparin 705 units/hr.  No overt bleeding or complications noted.  Hgb 10.5>10.0>9.5.  Platelet count 120>100.  Goal of Therapy:  ACT < 150 Monitor  platelets by anticoagulation protocol: Yes   Plan:   Continue heparin in purge solution only.  ACT q6hrs  Monitor pump flows and possibly adding systemic heparin later per Impella protocol.  Arty Baumgartner, [place] Pager: [phone] or phone: [phone] 11/03/2017,6:59 PM

## 2017-11-03 NOTE — Progress Notes (Signed)
[address]       [place], [address]             [phone]        CARDIOTHORACIC SURGERY PROGRESS NOTE   R1 Day Post-Op Procedure(s) (LRB): CORONARY ARTERY BYPASS GRAFTING (CABG) x 1, ON PUMP, LIMA TO LAD, USING LEFT INTERNAL MAMMARY ARTERY, HARVESTED LEFT GREATER SAPHENOUS VEIN ENDOSCOPICALLY (N/A) TRANSESOPHAGEAL ECHOCARDIOGRAM (TEE) (N/A) CLIPPING OF ATRIAL APPENDAGE USING ATRICLIP PRO2 45 PLACEMENT OF IMPELLA LEFT VENTRICULAR ASSIST DEVICE  Subjective: Sedated on vent.  Reportedly has woke up and follows commands.  Objective: Vital signs: BP Readings from Last 1 Encounters:  11/03/17 110/73   Pulse Readings from Last 1 Encounters:  11/03/17 (!) 101   Resp Readings from Last 1 Encounters:  11/03/17 20   Temp Readings from Last 1 Encounters:  11/03/17 98.8 F (37.1 C)    Hemodynamics: PAP: (40-47)/(17-33) 42/32 CVP:  [15 mmHg-21 mmHg] 15 mmHg CO:  [2.8 L/min-4.3 L/min] 3.7 L/min CI:  [1.4 L/min/m2-2.1 L/min/m2] 1.8 L/min/m2  Mixed venous co-ox 63.6%  Physical Exam:  Rhythm:   Afib w/ controlled rate  Breath sounds: Coarse but symmetrical, no wheezes  Heart sounds:  irregular  Incisions:  Dressings dry, intact  Abdomen:  Soft, non-distended  Extremities:  Warm, well-perfused  Chest tubes:  low volume thin serosanguinous output, no air leak    Intake/Output from previous day: 10/20 0701 - 10/21 0700 In: 16110.6 [I.V.:8644.1; LTJQZ:0092; NG/GT:30; IV Piggyback:3023.5] Out: 3300 [Urine:992; Emesis/NG output:250; Blood:2500; Chest Tube:1220] Intake/Output this shift: Total I/O In: 187.4 [I.V.:141.7; Other:12.8; IV Piggyback:32.9] Out: 100 [Urine:50; Chest Tube:50]  Lab Results:  CBC: Recent Labs    10/29/2017 2259 11/03/17 0303  WBC 9.7 10.0  HGB 11.0*  10.2* 10.5*  HCT 33.8*  30.0* 31.1*  PLT 121* 120*    BMET:  Recent Labs    10/16/2017 0222  10/21/2017 2259 11/03/17 0303  NA 137   < > 141 139  K 3.8   < > 3.9 3.6    CL 99   < > 106 109  CO2 30  --   --  23  GLUCOSE 178*   < > 190* 143*  BUN 21   < > 22 21  CREATININE 1.13   < > 1.40* 1.45*  CALCIUM 8.9  --   --  7.5*   < > = values in this interval not displayed.     PT/INR:   Recent Labs    10/19/2017 1953  LABPROT 18.3*  INR 1.54    CBG (last 3)  Recent Labs    11/03/17 0302 11/03/17 0356 11/03/17 0454  GLUCAP 136* 119* 120*    ABG    Component Value Date/Time   PHART 7.405 11/03/2017 0310   PCO2ART 41.2 11/03/2017 0310   PO2ART 111.0 (H) 11/03/2017 0310   HCO3 25.8 11/03/2017 0310   TCO2 27 11/03/2017 0310   ACIDBASEDEF 1.0 10/25/2017 1354   O2SAT 63.6 11/03/2017 0430    CXR: PORTABLE CHEST 1 VIEW  COMPARISON:  10/26/2017  FINDINGS: Endotracheal tube is at the carina. Consider withdrawal of 1 or 2 cm. Nasogastric tube enters the abdomen. Swan-Ganz catheter tip is in the main pulmonary artery. Impella device tip is at the left ventricular apex. Left chest tube is in place. There is no pneumothorax. There is less pulmonary edema and better aeration of the left lung.  IMPRESSION: Lines and tubes well positioned, with the note that the  endotracheal tube is at the carina. Impella appears the same. Less edema and better aeration of the left lung.   Electronically Signed   By: Nelson Chimes M.D.   On: 11/03/2017 08:08  Assessment/Plan: S/P Procedure(s) (LRB): CORONARY ARTERY BYPASS GRAFTING (CABG) x 1, ON PUMP, LIMA TO LAD, USING LEFT INTERNAL MAMMARY ARTERY, HARVESTED LEFT GREATER SAPHENOUS VEIN ENDOSCOPICALLY (N/A) TRANSESOPHAGEAL ECHOCARDIOGRAM (TEE) (N/A) CLIPPING OF ATRIAL APPENDAGE USING ATRICLIP PRO2 39 PLACEMENT OF IMPELLA LEFT VENTRICULAR ASSIST DEVICE  Overall stable POD1 Maintaining stable rhythm and hemodynamics w/ coox 63% and lactate trending down PA pressures stable, CVP 15 c/w stable RV function LVAD flows down slightly - Impella position to be checked and adjusted as needed O2 sats 99-100%  on 50% FiO2, CXR looks good Chest tube output low, serosanguinous UOP 30-50 mL/hr Post op elevated serum creatinine, likely due to acute kidney injury caused by ATN Expected post op acute blood loss anemia, mild, stable Acute on chronic combined systolic and diastolic CHF with expected post op volume excess Post op thrombocytopenia, mild   Start heparin in Impella purge line but keep ACT < 150 for now  Start lasix drip to stimulate UOP  Monitor Impella position and function closely per routine    Rexene Alberts, MD 11/03/2017 8:48 AM

## 2017-11-03 NOTE — Plan of Care (Signed)
  Problem: Education: Goal: Knowledge of General Education information will improve Description Including pain rating scale, medication(s)/side effects and non-pharmacologic comfort measures Outcome: Progressing   Problem: Health Behavior/Discharge Planning: Goal: Ability to manage health-related needs will improve Outcome: Progressing   Problem: Clinical Measurements: Goal: Ability to maintain clinical measurements within normal limits will improve Outcome: Progressing Goal: Will remain free from infection Outcome: Progressing Goal: Diagnostic test results will improve Outcome: Progressing Goal: Respiratory complications will improve Outcome: Progressing Goal: Cardiovascular complication will be avoided Outcome: Progressing   Problem: Activity: Goal: Risk for activity intolerance will decrease Outcome: Progressing   Problem: Nutrition: Goal: Adequate nutrition will be maintained Outcome: Progressing   Problem: Coping: Goal: Level of anxiety will decrease Outcome: Progressing   Problem: Elimination: Goal: Will not experience complications related to bowel motility Outcome: Progressing Goal: Will not experience complications related to urinary retention Outcome: Progressing   Problem: Pain Managment: Goal: General experience of comfort will improve Outcome: Progressing   Problem: Safety: Goal: Ability to remain free from injury will improve Outcome: Progressing   Problem: Skin Integrity: Goal: Risk for impaired skin integrity will decrease Outcome: Progressing   Problem: Cardiovascular: Goal: Ability to achieve and maintain adequate cardiovascular perfusion will improve Outcome: Progressing Goal: Vascular access site(s) Level 0-1 will be maintained Outcome: Progressing   Problem: Health Behavior/Discharge Planning: Goal: Ability to safely manage health-related needs after discharge will improve Outcome: Progressing   Problem: Education: Goal: Will  demonstrate proper wound care and an understanding of methods to prevent future damage Outcome: Progressing Goal: Knowledge of disease or condition will improve Outcome: Progressing Goal: Knowledge of the prescribed therapeutic regimen will improve Outcome: Progressing Goal: Individualized Educational Video(s) Outcome: Progressing   Problem: Activity: Goal: Risk for activity intolerance will decrease Outcome: Progressing   Problem: Cardiac: Goal: Will achieve and/or maintain hemodynamic stability Outcome: Progressing   Problem: Clinical Measurements: Goal: Postoperative complications will be avoided or minimized Outcome: Progressing   Problem: Respiratory: Goal: Respiratory status will improve Outcome: Progressing   Problem: Skin Integrity: Goal: Wound healing without signs and symptoms of infection Outcome: Progressing Goal: Risk for impaired skin integrity will decrease Outcome: Progressing   Problem: Urinary Elimination: Goal: Ability to achieve and maintain adequate renal perfusion and functioning will improve Outcome: Progressing

## 2017-11-04 ENCOUNTER — Inpatient Hospital Stay (HOSPITAL_COMMUNITY): Payer: PPO

## 2017-11-04 DIAGNOSIS — J189 Pneumonia, unspecified organism: Secondary | ICD-10-CM

## 2017-11-04 DIAGNOSIS — R6521 Severe sepsis with septic shock: Secondary | ICD-10-CM

## 2017-11-04 DIAGNOSIS — R579 Shock, unspecified: Secondary | ICD-10-CM

## 2017-11-04 DIAGNOSIS — J80 Acute respiratory distress syndrome: Secondary | ICD-10-CM

## 2017-11-04 DIAGNOSIS — J9602 Acute respiratory failure with hypercapnia: Secondary | ICD-10-CM

## 2017-11-04 DIAGNOSIS — A419 Sepsis, unspecified organism: Secondary | ICD-10-CM | POA: Diagnosis not present

## 2017-11-04 DIAGNOSIS — I5031 Acute diastolic (congestive) heart failure: Secondary | ICD-10-CM

## 2017-11-04 LAB — COMPREHENSIVE METABOLIC PANEL
ALBUMIN: 3.1 g/dL — AB (ref 3.5–5.0)
ALT: 165 U/L — ABNORMAL HIGH (ref 0–44)
AST: 752 U/L — AB (ref 15–41)
Alkaline Phosphatase: 43 U/L (ref 38–126)
Anion gap: 7 (ref 5–15)
BUN: 20 mg/dL (ref 8–23)
CHLORIDE: 108 mmol/L (ref 98–111)
CO2: 22 mmol/L (ref 22–32)
Calcium: 7.7 mg/dL — ABNORMAL LOW (ref 8.9–10.3)
Creatinine, Ser: 1.52 mg/dL — ABNORMAL HIGH (ref 0.61–1.24)
GFR calc Af Amer: 52 mL/min — ABNORMAL LOW (ref >60)
GFR calc non Af Amer: 45 mL/min — ABNORMAL LOW (ref >60)
Glucose, Bld: 107 mg/dL — ABNORMAL HIGH (ref 70–99)
POTASSIUM: 3.7 mmol/L (ref 3.5–5.1)
SODIUM: 137 mmol/L (ref 135–145)
Total Bilirubin: 1.4 mg/dL — ABNORMAL HIGH (ref 0.3–1.2)
Total Protein: 5.2 g/dL — ABNORMAL LOW (ref 6.5–8.1)

## 2017-11-04 LAB — COOXEMETRY PANEL
Carboxyhemoglobin: 1 % (ref 0.5–1.5)
Carboxyhemoglobin: 1.3 % (ref 0.5–1.5)
METHEMOGLOBIN: 1.6 % — AB (ref 0.0–1.5)
Methemoglobin: 1.6 % — ABNORMAL HIGH (ref 0.0–1.5)
O2 SAT: 59.2 %
O2 Saturation: 35.6 %
TOTAL HEMOGLOBIN: 10.3 g/dL — AB (ref 12.0–16.0)
TOTAL HEMOGLOBIN: 9.5 g/dL — AB (ref 12.0–16.0)

## 2017-11-04 LAB — POCT I-STAT 3, ART BLOOD GAS (G3+)
ACID-BASE DEFICIT: 5 mmol/L — AB (ref 0.0–2.0)
ACID-BASE DEFICIT: 6 mmol/L — AB (ref 0.0–2.0)
ACID-BASE DEFICIT: 7 mmol/L — AB (ref 0.0–2.0)
Acid-Base Excess: 1 mmol/L (ref 0.0–2.0)
Acid-base deficit: 8 mmol/L — ABNORMAL HIGH (ref 0.0–2.0)
Acid-base deficit: 2 mmol/L (ref 0.0–2.0)
BICARBONATE: 24 mmol/L (ref 20.0–28.0)
BICARBONATE: 19.8 mmol/L — AB (ref 20.0–28.0)
BICARBONATE: 20 mmol/L (ref 20.0–28.0)
Bicarbonate: 21.9 mmol/L (ref 20.0–28.0)
Bicarbonate: 25.5 mmol/L (ref 20.0–28.0)
Bicarbonate: 20.3 mmol/L (ref 20.0–28.0)
O2 SAT: 93 %
O2 Saturation: 92 %
O2 Saturation: 85 %
O2 Saturation: 88 %
O2 Saturation: 93 %
O2 Saturation: 90 %
PCO2 ART: 61.3 mmHg — AB (ref 32.0–48.0)
PH ART: 7.178 — AB (ref 7.350–7.450)
PH ART: 7.231 — AB (ref 7.350–7.450)
Patient temperature: 37.9
Patient temperature: 37.8
TCO2: 25 mmol/L (ref 22–32)
TCO2: 21 mmol/L — ABNORMAL LOW (ref 22–32)
TCO2: 21 mmol/L — ABNORMAL LOW (ref 22–32)
TCO2: 24 mmol/L (ref 22–32)
TCO2: 27 mmol/L (ref 22–32)
TCO2: 22 mmol/L (ref 22–32)
pCO2 arterial: 34.6 mmHg (ref 32.0–48.0)
pCO2 arterial: 37.6 mmHg (ref 32.0–48.0)
pCO2 arterial: 50.9 mmHg — ABNORMAL HIGH (ref 32.0–48.0)
pCO2 arterial: 59.7 mmHg — ABNORMAL HIGH (ref 32.0–48.0)
pCO2 arterial: 53.2 mmHg — ABNORMAL HIGH (ref 32.0–48.0)
pH, Arterial: 7.453 — ABNORMAL HIGH (ref 7.350–7.450)
pH, Arterial: 7.335 — ABNORMAL LOW (ref 7.350–7.450)
pH, Arterial: 7.206 — ABNORMAL LOW (ref 7.350–7.450)
pH, Arterial: 7.193 — CL (ref 7.350–7.450)
pO2, Arterial: 68 mmHg — ABNORMAL LOW (ref 83.0–108.0)
pO2, Arterial: 71 mmHg — ABNORMAL LOW (ref 83.0–108.0)
pO2, Arterial: 64 mmHg — ABNORMAL LOW (ref 83.0–108.0)
pO2, Arterial: 71 mmHg — ABNORMAL LOW (ref 83.0–108.0)
pO2, Arterial: 84 mmHg (ref 83.0–108.0)
pO2, Arterial: 76 mmHg — ABNORMAL LOW (ref 83.0–108.0)

## 2017-11-04 LAB — BASIC METABOLIC PANEL
ANION GAP: 6 (ref 5–15)
Anion gap: 19 — ABNORMAL HIGH (ref 5–15)
BUN: 16 mg/dL (ref 8–23)
BUN: 24 mg/dL — AB (ref 8–23)
CALCIUM: 5.5 mg/dL — AB (ref 8.9–10.3)
CHLORIDE: 106 mmol/L (ref 98–111)
CO2: 16 mmol/L — AB (ref 22–32)
CO2: 18 mmol/L — AB (ref 22–32)
CREATININE: 2.01 mg/dL — AB (ref 0.61–1.24)
Calcium: 6.9 mg/dL — ABNORMAL LOW (ref 8.9–10.3)
Chloride: 118 mmol/L — ABNORMAL HIGH (ref 98–111)
Creatinine, Ser: 1.07 mg/dL (ref 0.61–1.24)
GFR calc Af Amer: 37 mL/min — ABNORMAL LOW (ref >60)
GFR calc non Af Amer: 32 mL/min — ABNORMAL LOW (ref >60)
GLUCOSE: 87 mg/dL (ref 70–99)
Glucose, Bld: 131 mg/dL — ABNORMAL HIGH (ref 70–99)
POTASSIUM: 3.7 mmol/L (ref 3.5–5.1)
Potassium: 5 mmol/L (ref 3.5–5.1)
Sodium: 140 mmol/L (ref 135–145)
Sodium: 143 mmol/L (ref 135–145)

## 2017-11-04 LAB — POCT I-STAT, CHEM 8
BUN: 29 mg/dL — ABNORMAL HIGH (ref 8–23)
CREATININE: 1.9 mg/dL — AB (ref 0.61–1.24)
Calcium, Ion: 0.93 mmol/L — ABNORMAL LOW (ref 1.15–1.40)
Chloride: 101 mmol/L (ref 98–111)
Glucose, Bld: 124 mg/dL — ABNORMAL HIGH (ref 70–99)
HEMATOCRIT: 25 % — AB (ref 39.0–52.0)
HEMOGLOBIN: 8.5 g/dL — AB (ref 13.0–17.0)
POTASSIUM: 4.9 mmol/L (ref 3.5–5.1)
SODIUM: 141 mmol/L (ref 135–145)
TCO2: 22 mmol/L (ref 22–32)

## 2017-11-04 LAB — CBC WITH DIFFERENTIAL/PLATELET
Abs Immature Granulocytes: 0.11 10*3/uL — ABNORMAL HIGH (ref 0.00–0.07)
BASOS ABS: 0.1 10*3/uL (ref 0.0–0.1)
Basophils Relative: 1 %
EOS ABS: 0 10*3/uL (ref 0.0–0.5)
Eosinophils Relative: 0 %
HEMATOCRIT: 31.4 % — AB (ref 39.0–52.0)
Hemoglobin: 10.2 g/dL — ABNORMAL LOW (ref 13.0–17.0)
IMMATURE GRANULOCYTES: 1 %
LYMPHS ABS: 1.7 10*3/uL (ref 0.7–4.0)
Lymphocytes Relative: 12 %
MCH: 28.7 pg (ref 26.0–34.0)
MCHC: 32.5 g/dL (ref 30.0–36.0)
MCV: 88.5 fL (ref 80.0–100.0)
Monocytes Absolute: 1 10*3/uL (ref 0.1–1.0)
Monocytes Relative: 7 %
NEUTROS ABS: 11.4 10*3/uL — AB (ref 1.7–7.7)
NEUTROS PCT: 79 %
PLATELETS: 100 10*3/uL — AB (ref 150–400)
RBC: 3.55 MIL/uL — ABNORMAL LOW (ref 4.22–5.81)
RDW: 15.9 % — AB (ref 11.5–15.5)
WBC: 14.3 10*3/uL — ABNORMAL HIGH (ref 4.0–10.5)
nRBC: 0.8 % — ABNORMAL HIGH (ref 0.0–0.2)

## 2017-11-04 LAB — GLUCOSE, CAPILLARY
GLUCOSE-CAPILLARY: 104 mg/dL — AB (ref 70–99)
GLUCOSE-CAPILLARY: 109 mg/dL — AB (ref 70–99)
GLUCOSE-CAPILLARY: 109 mg/dL — AB (ref 70–99)
GLUCOSE-CAPILLARY: 112 mg/dL — AB (ref 70–99)
GLUCOSE-CAPILLARY: 138 mg/dL — AB (ref 70–99)
Glucose-Capillary: 102 mg/dL — ABNORMAL HIGH (ref 70–99)
Glucose-Capillary: 108 mg/dL — ABNORMAL HIGH (ref 70–99)
Glucose-Capillary: 115 mg/dL — ABNORMAL HIGH (ref 70–99)
Glucose-Capillary: 123 mg/dL — ABNORMAL HIGH (ref 70–99)
Glucose-Capillary: 134 mg/dL — ABNORMAL HIGH (ref 70–99)
Glucose-Capillary: 137 mg/dL — ABNORMAL HIGH (ref 70–99)
Glucose-Capillary: 140 mg/dL — ABNORMAL HIGH (ref 70–99)
Glucose-Capillary: 157 mg/dL — ABNORMAL HIGH (ref 70–99)
Glucose-Capillary: 177 mg/dL — ABNORMAL HIGH (ref 70–99)
Glucose-Capillary: 88 mg/dL (ref 70–99)

## 2017-11-04 LAB — ECHOCARDIOGRAM LIMITED
HEIGHTINCHES: 68 in
Weight: 3580.27 oz

## 2017-11-04 LAB — LACTATE DEHYDROGENASE: LDH: 1832 U/L — ABNORMAL HIGH (ref 98–192)

## 2017-11-04 LAB — POCT ACTIVATED CLOTTING TIME
ACTIVATED CLOTTING TIME: 142 s
ACTIVATED CLOTTING TIME: 147 s
Activated Clotting Time: 141 seconds
Activated Clotting Time: 142 seconds
Activated Clotting Time: 147 seconds
Activated Clotting Time: 147 seconds
Activated Clotting Time: 147 seconds
Activated Clotting Time: 153 seconds

## 2017-11-04 LAB — LACTIC ACID, PLASMA
LACTIC ACID, VENOUS: 1.7 mmol/L (ref 0.5–1.9)
Lactic Acid, Venous: 2.7 mmol/L (ref 0.5–1.9)
Lactic Acid, Venous: 1.7 mmol/L (ref 0.5–1.9)

## 2017-11-04 LAB — MAGNESIUM: MAGNESIUM: 2 mg/dL (ref 1.7–2.4)

## 2017-11-04 LAB — PROCALCITONIN: Procalcitonin: 0.82 ng/mL

## 2017-11-04 LAB — APTT: APTT: 53 s — AB (ref 24–36)

## 2017-11-04 MED ORDER — FENTANYL CITRATE (PF) 100 MCG/2ML IJ SOLN: 100.0000 ug | Freq: Once | INTRAMUSCULAR | Status: DC

## 2017-11-04 MED ORDER — ROCURONIUM BROMIDE 50 MG/5ML IV SOLN
50.0000 mg | Freq: Once | INTRAVENOUS | Status: DC
  Filled 2017-11-04: qty 5

## 2017-11-04 MED ORDER — MIDAZOLAM HCL 2 MG/2ML IJ SOLN
INTRAMUSCULAR | Status: AC
  Filled 2017-11-04: qty 2

## 2017-11-04 MED ORDER — SODIUM CHLORIDE 0.9 % IV SOLN
2.0000 g | INTRAVENOUS | Status: DC
  Administered 2017-11-04: 2 g via INTRAVENOUS
  Filled 2017-11-04 (×2): qty 2

## 2017-11-04 MED ORDER — EPINEPHRINE PF 1 MG/10ML IJ SOSY
PREFILLED_SYRINGE | INTRAMUSCULAR | Status: AC
  Filled 2017-11-04: qty 20

## 2017-11-04 MED ORDER — HEPARIN (PORCINE) IN NACL 100-0.45 UNIT/ML-% IJ SOLN
500.0000 [IU]/h | INTRAMUSCULAR | Status: DC
  Administered 2017-11-04: 200 [IU]/h via INTRAVENOUS
  Filled 2017-11-04: qty 250

## 2017-11-04 MED ORDER — VANCOMYCIN HCL IN DEXTROSE 750-5 MG/150ML-% IV SOLN
750.0000 mg | Freq: Two times a day (BID) | INTRAVENOUS | Status: DC
  Filled 2017-11-04 (×2): qty 150

## 2017-11-04 MED ORDER — POTASSIUM CHLORIDE 10 MEQ/50ML IV SOLN
10.0000 meq | INTRAVENOUS | Status: AC
  Administered 2017-11-04 (×3): 10 meq via INTRAVENOUS
  Filled 2017-11-04 (×3): qty 50

## 2017-11-04 MED ORDER — AMIODARONE LOAD VIA INFUSION
150.0000 mg | INTRAVENOUS | Status: DC | PRN
  Administered 2017-11-04 (×2): 150 mg via INTRAVENOUS
  Filled 2017-11-04: qty 83.34

## 2017-11-04 MED ORDER — LEVALBUTEROL HCL 1.25 MG/0.5ML IN NEBU
1.2500 mg | INHALATION_SOLUTION | Freq: Three times a day (TID) | RESPIRATORY_TRACT | Status: DC
  Administered 2017-11-04: 1.25 mg via RESPIRATORY_TRACT
  Filled 2017-11-04: qty 0.5

## 2017-11-04 MED ORDER — METOLAZONE 2.5 MG PO TABS: 2.5000 mg | ORAL_TABLET | Freq: Once | ORAL | Status: DC

## 2017-11-04 MED ORDER — AMIODARONE LOAD VIA INFUSION
150.0000 mg | Freq: Once | INTRAVENOUS | Status: AC
  Administered 2017-11-04: 150 mg via INTRAVENOUS
  Filled 2017-11-04: qty 83.34

## 2017-11-04 MED ORDER — STERILE WATER FOR INJECTION IV SOLN
INTRAVENOUS | Status: DC
  Administered 2017-11-04: 19:00:00 via INTRAVENOUS
  Filled 2017-11-04 (×3): qty 850

## 2017-11-04 MED ORDER — DOBUTAMINE IN D5W 4-5 MG/ML-% IV SOLN
2.5000 ug/kg/min | INTRAVENOUS | Status: DC
  Administered 2017-11-04: 3 ug/kg/min via INTRAVENOUS
  Filled 2017-11-04: qty 250

## 2017-11-04 MED ORDER — EPINEPHRINE PF 1 MG/ML IJ SOLN
0.5000 ug/min | INTRAVENOUS | Status: DC
  Administered 2017-11-04: 10 ug/min via INTRAVENOUS
  Filled 2017-11-04 (×2): qty 4

## 2017-11-04 MED ORDER — PHENYLEPHRINE HCL-NACL 20-0.9 MG/250ML-% IV SOLN
0.0000 ug/min | INTRAVENOUS | Status: DC
  Filled 2017-11-04: qty 250

## 2017-11-04 MED ORDER — ETOMIDATE 2 MG/ML IV SOLN: 20.0000 mg | Freq: Once | INTRAVENOUS | Status: DC

## 2017-11-04 MED ORDER — MIDAZOLAM HCL 2 MG/2ML IJ SOLN: 2.0000 mg | Freq: Once | INTRAMUSCULAR | Status: DC

## 2017-11-04 MED ORDER — LEVALBUTEROL HCL 1.25 MG/0.5ML IN NEBU
INHALATION_SOLUTION | RESPIRATORY_TRACT | Status: AC
  Administered 2017-11-04: 1.25 mg
  Filled 2017-11-04: qty 0.5

## 2017-11-04 MED ORDER — VANCOMYCIN HCL 10 G IV SOLR
2000.0000 mg | INTRAVENOUS | Status: DC
  Administered 2017-11-04: 2000 mg via INTRAVENOUS
  Filled 2017-11-04 (×2): qty 2000

## 2017-11-04 MED ORDER — METOLAZONE 2.5 MG PO TABS
2.5000 mg | ORAL_TABLET | Freq: Once | ORAL | Status: AC
  Administered 2017-11-04: 2.5 mg via ORAL
  Filled 2017-11-04: qty 1

## 2017-11-04 MED ORDER — FENTANYL CITRATE (PF) 100 MCG/2ML IJ SOLN
INTRAMUSCULAR | Status: AC
  Filled 2017-11-04: qty 2

## 2017-11-04 MED ORDER — FENTANYL CITRATE (PF) 100 MCG/2ML IJ SOLN: 50.0000 ug | Freq: Once | INTRAMUSCULAR | Status: DC

## 2017-11-04 MED ORDER — LEVALBUTEROL HCL 1.25 MG/0.5ML IN NEBU: 1.2500 mg | INHALATION_SOLUTION | Freq: Four times a day (QID) | RESPIRATORY_TRACT | Status: DC | PRN

## 2017-11-04 MED ORDER — VASOPRESSIN 20 UNIT/ML IV SOLN
0.0300 [IU]/min | INTRAVENOUS | Status: DC
  Administered 2017-11-04: 0.03 [IU]/min via INTRAVENOUS
  Filled 2017-11-04: qty 2

## 2017-11-04 MED ORDER — CALCIUM GLUCONATE-NACL 2-0.675 GM/100ML-% IV SOLN
2.0000 g | Freq: Once | INTRAVENOUS | Status: AC
  Administered 2017-11-04: 2000 mg via INTRAVENOUS
  Filled 2017-11-04: qty 100

## 2017-11-04 MED ORDER — EPINEPHRINE PF 1 MG/10ML IJ SOSY
PREFILLED_SYRINGE | INTRAMUSCULAR | Status: AC
  Filled 2017-11-04: qty 10

## 2017-11-05 ENCOUNTER — Other Ambulatory Visit: Payer: Self-pay | Admitting: *Deleted

## 2017-11-05 LAB — TYPE AND SCREEN
ABO/RH(D): O NEG
ANTIBODY SCREEN: NEGATIVE
UNIT DIVISION: 0
UNIT DIVISION: 0
UNIT DIVISION: 0
UNIT DIVISION: 0
Unit division: 0
Unit division: 0
Unit division: 0
Unit division: 0
Unit division: 0
Unit division: 0
Unit division: 0
Unit division: 0

## 2017-11-05 LAB — BPAM RBC
BLOOD PRODUCT EXPIRATION DATE: 201910302359
BLOOD PRODUCT EXPIRATION DATE: 201910302359
BLOOD PRODUCT EXPIRATION DATE: 201910312359
BLOOD PRODUCT EXPIRATION DATE: 201911202359
BLOOD PRODUCT EXPIRATION DATE: 201911212359
BLOOD PRODUCT EXPIRATION DATE: 201911212359
BLOOD PRODUCT EXPIRATION DATE: 201911212359
Blood Product Expiration Date: 201910302359
Blood Product Expiration Date: 201910312359
Blood Product Expiration Date: 201911162359
Blood Product Expiration Date: 201911212359
Blood Product Expiration Date: 201911212359
ISSUE DATE / TIME: 201910201020
ISSUE DATE / TIME: 201910201020
ISSUE DATE / TIME: 201910201252
ISSUE DATE / TIME: 201910201252
ISSUE DATE / TIME: 201910201252
ISSUE DATE / TIME: 201910201434
ISSUE DATE / TIME: 201910201434
ISSUE DATE / TIME: 201910201434
ISSUE DATE / TIME: 201910201434
ISSUE DATE / TIME: 201910201800
ISSUE DATE / TIME: 201910201800
ISSUE DATE / TIME: 201910230716
UNIT TYPE AND RH: 5100
UNIT TYPE AND RH: 5100
UNIT TYPE AND RH: 9500
UNIT TYPE AND RH: 9500
UNIT TYPE AND RH: 9500
Unit Type and Rh: 5100
Unit Type and Rh: 5100
Unit Type and Rh: 5100
Unit Type and Rh: 5100
Unit Type and Rh: 9500
Unit Type and Rh: 9500
Unit Type and Rh: 9500

## 2017-11-05 LAB — URINE CULTURE: Culture: NO GROWTH

## 2017-11-05 LAB — CALCIUM, IONIZED: CALCIUM, IONIZED, SERUM: 4.1 mg/dL — AB (ref 4.5–5.6)

## 2017-11-05 MED FILL — Sodium Bicarbonate IV Soln 8.4%: INTRAVENOUS | Qty: 200 | Status: AC

## 2017-11-05 MED FILL — Heparin Sodium (Porcine) Inj 1000 Unit/ML: INTRAMUSCULAR | Qty: 90 | Status: AC

## 2017-11-05 MED FILL — Calcium Chloride Inj 10%: INTRAVENOUS | Qty: 10 | Status: AC

## 2017-11-05 MED FILL — Mannitol IV Soln 20%: INTRAVENOUS | Qty: 500 | Status: AC

## 2017-11-05 MED FILL — Lidocaine HCl(Cardiac) IV PF Soln Pref Syr 100 MG/5ML (2%): INTRAVENOUS | Qty: 5 | Status: AC

## 2017-11-05 MED FILL — Sodium Chloride IV Soln 0.9%: INTRAVENOUS | Qty: 4000 | Status: AC

## 2017-11-05 MED FILL — Heparin Sodium (Porcine) Inj 1000 Unit/ML: INTRAMUSCULAR | Qty: 30 | Status: AC

## 2017-11-05 MED FILL — Electrolyte-R (PH 7.4) Solution: INTRAVENOUS | Qty: 6000 | Status: AC

## 2017-11-05 NOTE — Anesthesia Postprocedure Evaluation (Signed)
Anesthesia Post Note  Patient: Charles Bailey  Procedure(Bailey) Performed: CORONARY ARTERY BYPASS GRAFTING (CABG) x 1, ON PUMP, LIMA TO LAD, USING LEFT INTERNAL MAMMARY ARTERY, HARVESTED LEFT GREATER SAPHENOUS VEIN ENDOSCOPICALLY (N/A Chest) TRANSESOPHAGEAL ECHOCARDIOGRAM (TEE) (N/A ) CLIPPING OF ATRIAL APPENDAGE USING ATRICLIP PRO2 45 (Chest) PLACEMENT OF IMPELLA LEFT VENTRICULAR ASSIST DEVICE     Patient location during evaluation: SICU Anesthesia Type: General Level of consciousness: patient remains intubated per anesthesia plan Pain management: pain level controlled Vital Signs Assessment: post-procedure vital signs reviewed and stable Respiratory status: patient remains intubated per anesthesia plan Cardiovascular status: unstable Anesthetic complications: no Comments: Taken to CTICU, multiple pressors required as patient still in cardiogenic shock    Last Vitals:  Vitals:   11/22/2017 1645 11/22/17 1700  BP:    Pulse: (!) 137 (!) 136  Resp: 20 20  Temp: 37.8 C 37.8 C  SpO2: (!) 7% (!) 33%    Last Pain:  Vitals:   11-22-2017 1304  TempSrc:   PainSc: Asleep                 Charles Bailey

## 2017-11-06 LAB — CULTURE, RESPIRATORY W GRAM STAIN: Gram Stain: NONE SEEN

## 2017-11-06 LAB — CULTURE, RESPIRATORY

## 2017-11-06 MED FILL — Medication: Qty: 1 | Status: AC

## 2017-11-09 LAB — CULTURE, BLOOD (ROUTINE X 2)
CULTURE: NO GROWTH
CULTURE: NO GROWTH
SPECIAL REQUESTS: ADEQUATE
Special Requests: ADEQUATE

## 2017-11-14 NOTE — Progress Notes (Signed)
ANTICOAGULATION CONSULT NOTE - Follow Up Consult  Pharmacy Consult for Heparin with Impella CP Indication: Impella  No Known Allergies  Patient Measurements: Height: 5\' 8"  (172.7 cm) Weight: 223 lb 12.3 oz (101.5 kg) IBW/kg (Calculated) : 68.4 Heparin Dosing Weight: 86 kg  Vital Signs: Temp: 100 F (37.8 C) (10/22 1003) Temp Source: Core (10/22 0400) BP: 92/76 (10/22 1001) Pulse Rate: 131 (10/22 1003)  Labs: Recent Labs    10/22/2017 0222 11/13/2017 0754  11/01/2017 1610  11/05/2017 1953  11/03/17 0303  11/03/17 1638 11-27-17 0357 11-27-17 0813  HGB 12.1*  --    < > 10.0*   < > 11.3*   < > 10.5*  --  10.0*  9.5* 10.2*  --   HCT 37.7*  --    < > 31.0*   < > 35.3*   < > 31.1*  --  31.0*  28.0* 31.4*  --   PLT 187  --    < > 149*  --  116*   < > 120*  --  100* 100*  --   APTT 91*  --   --  47*  --  92*  --   --   --   --  53*  --   LABPROT  --   --   --  20.2*  --  18.3*  --   --   --   --   --   --   INR  --   --   --  1.75  --  1.54  --   --   --   --   --   --   HEPARINUNFRC 0.81*  --   --   --   --   --   --   --   --   --   --   --   CREATININE 1.13  --    < >  --    < >  --    < > 1.45*   < > 1.50* 1.52* 1.07  TROPONINI  --  0.11*  --   --   --   --   --   --   --   --   --   --    < > = values in this interval not displayed.    Estimated Creatinine Clearance: 74.1 mL/min (by C-G formula based on SCr of 1.07 mg/dL).  Assessment: 72 yo male on Impella CP s/p CABG, on heparin in purge solution only.  Spoke to Drs. Roxy Manns and [place], okay to add heparin systemically today.   ACT 147 this AM on purge heparin only.    Based on purge flow rate of 14.2 ml/hr, he is receiving heparin 710 units/hr.  No overt bleeding or complications noted.  Hgb 10.2.  Platelet count 120>100.  Fairly stable.  Goal of Therapy:  ACT 160-180 Monitor platelets by anticoagulation protocol: Yes   Plan:  Add systemic heparin this morning to target ACT 160-180 based on Impella protocol. Monitor  ACTs per protocol. Pharmacy will continue to follow.  Marguerite Olea, [facility] Clinical Pharmacist Phone [phone]  Nov 27, 2017 10:34 AM

## 2017-11-14 NOTE — Procedures (Signed)
Bronchoscopy Procedure Note RAMSEY GUADAMUZ [id number] 1945-12-15  Procedure: Bronchoscopy Indications: Diagnostic evaluation of the airways and Remove secretions  Procedure Details Consent: Unable to obtain consent because of emergent medical necessity. Time Out: Verified patient identification, verified procedure, site/side was marked, verified correct patient position, special equipment/implants available, medications/allergies/relevent history reviewed, required imaging and test results available.  Performed  In preparation for procedure, patient was given 100% FiO2 and bronchoscope lubricated. Sedation: Benzodiazepines, Muscle relaxants, Etomidate and Fentanyl  Airway entered and the following bronchi were examined: RUL, RML, RLL, LUL, LLL and Bronchi.   LLL completely occluded with purulent secretions Bronchoscope removed.  , Patient placed back on 100% FiO2 at conclusion of procedure.    Evaluation Hemodynamic Status: BP stable throughout; O2 sats: stable throughout Patient's Current Condition: stable Specimens:  No specimen sent Complications: No apparent complications Patient did tolerate procedure well.   Jennet Maduro Nov 21, 2017

## 2017-11-14 NOTE — Progress Notes (Signed)
Paged to room with pt in afib RVR 130s (Previously 110s) and increased norepi requirement. SBP 90-100s on arrival. Rebolused amio.   Georgiana Shore, NP

## 2017-11-14 NOTE — Progress Notes (Signed)
Initial Nutrition Assessment  DOCUMENTATION CODES:   Not applicable  INTERVENTION:   Recommend initiation of Enteral Nutrition within 24 hours if unable to extubate  Cortrak Service is available Wednesday 10/23 if post-pyloric tube placement is desired  Tube Feeding:  Vital 1.5 @ 50 ml/hr Pro-Stat 30 mL TID Provides 127 g of protein, 2100 kcals, 912 mL of free water Meets 100% estimated calorie needs, 96% calorie needs   NUTRITION DIAGNOSIS:   Inadequate oral intake related to acute illness as evidenced by NPO status.  GOAL:   Patient will meet greater than or equal to 90% of their needs  MONITOR:   Vent status, Labs, Weight trends  REASON FOR ASSESSMENT:   Ventilator    ASSESSMENT:   72 yo male presents with SOB with acute respiratory failure likely from CHF on 10/17. Plan for CABG on 10/23 but pt with V.fib arrest in setting of Anterior MI requiring emergent CABG and Impella placement on 10/20. Pt remains on vent post-op  PMH includes HTN, HLD, CHF, DM   10/17 Admit 10/20 Anterior MI with V.fib arrest while awaiting CABG; emergent CABG with LIMA-LAD and Impella placement  Patient is currently intubated on ventilator support MV: 15 L/min Temp (24hrs), Avg:100.1 F (37.8 C), Min:99 F (37.2 C), Max:100.9 F (38.3 C)  Current wt 101.5 kg. Admission weight 89.8 kg. Noted lowest weight this admission 85.4 kg on 10/18; weight up post-op likely related to fluid. Plan to utilize weight of 85.4 as EDW (BMI <30)  Unable to obtain diet and weight history from pt at this time  Labs: reviewed Meds: lasix drip, insulin drip, milrinone drip, precedex, levophed drip  Diet Order:   Diet Order            Diet NPO time specified  Diet effective now              EDUCATION NEEDS:   Not appropriate for education at this time  Skin:  Skin Assessment: Skin Integrity Issues: Skin Integrity Issues:: Incisions Incisions: chest, leg  Last BM:  10/17  Height:   Ht  Readings from Last 1 Encounters:  12/04/2017 5\' 8"  (1.727 m)    Weight:   Wt Readings from Last 1 Encounters:  2017-12-04 101.5 kg    Ideal Body Weight:     BMI:  Body mass index is 34.02 kg/m.  Estimated Nutritional Needs:   Kcal:  2178 kcals  Protein:  125-150 g   Fluid:  >/= 1.7 L   Kerman Passey MS, RD, LDN, CNSC [phone] Pager  [phone] Weekend/On-Call Pager

## 2017-11-14 NOTE — Progress Notes (Signed)
RT called to room by RN due to patient desaturating. 100% FIO2 breaths were given & the patient was lavaged and suctioned without improvement. Patient still stated the he couldn't breathe. Patient was then placed on 100% FIO2, bagged & lavaged without improvement. ABG obtained & all values were within normal limits. Versed was given by RN & Xopenex neb was given by this RT. CVP reading was 24 & patient had crackles with expiratory wheezes in all lung fields. Dr. Roxy Manns was called & made aware of all of these events listed above. No new orders for RT or vent changes were given. RN was instructed to increase sedation.

## 2017-11-14 NOTE — Progress Notes (Signed)
Patient ID: FREDRICK GEOGHEGAN, male   DOB: 1945-02-17, 72 y.o.   MRN: [id number]  Called to bedside with difficulty oxygenating patient.  pCXR showed left lung infiltrate markedly worsened.  Patient became hypotensive.  Pressors escalated, patient required boluses of epinephrine and HCO3, HCO3 gtt begun.   Patient was bronched, pus noted in LLL with plug removal.  No pericardial effusion was noted on limited bedside echo.   Patient continued to require amps of epinephrine to maintain BP, unable to make progress.  Impella was in adequate position by echo, turned down to P8 to avoid suction.   Will discuss further steps with TCTS and CCM.  Patient is failing maximal doses of pressors and inotropes.   CRITICAL CARE Performed by: Loralie Champagne  Total critical care time: 45 minutes  Critical care time was exclusive of separately billable procedures and treating other patients.  Critical care was necessary to treat or prevent imminent or life-threatening deterioration.  Critical care was time spent personally by me on the following activities: development of treatment plan with patient and/or surrogate as well as nursing, discussions with consultants, evaluation of patient's response to treatment, examination of patient, obtaining history from patient or surrogate, ordering and performing treatments and interventions, ordering and review of laboratory studies, ordering and review of radiographic studies, pulse oximetry and re-evaluation of patient's condition.  Loralie Champagne 11-13-2017 5:44 PM

## 2017-11-14 NOTE — Progress Notes (Addendum)
TCTS BRIEF SICU PROGRESS NOTE  2 Days Post-Op  S/P Procedure(s) (LRB): CORONARY ARTERY BYPASS GRAFTING (CABG) x 1, ON PUMP, LIMA TO LAD, USING LEFT INTERNAL MAMMARY ARTERY, HARVESTED LEFT GREATER SAPHENOUS VEIN ENDOSCOPICALLY (N/A) TRANSESOPHAGEAL ECHOCARDIOGRAM (TEE) (N/A) CLIPPING OF ATRIAL APPENDAGE USING ATRICLIP PRO2 41 PLACEMENT OF IMPELLA LEFT VENTRICULAR ASSIST DEVICE   Patient developed profound shock unresponsive to all efforts and ultimately died with his family at the bedside.  Time of death 6:08 PM  Rexene Alberts, MD 11-09-17 6:10 PM

## 2017-11-14 NOTE — Progress Notes (Signed)
Patient passed away & ETT was removed with the okay from Dr. Roxy Manns.

## 2017-11-14 NOTE — Death Summary Note (Signed)
DEATH SUMMARY   Patient Details  Name: Charles Bailey MRN: [id number] DOB: 1945-06-06  Admission/Discharge Information   Admit Date:  23-Nov-2017  Date of Death: Date of Death: 11/28/17  Time of Death: Time of Death: 03/19/1806  Length of Stay: 4  Referring Physician: Susy Frizzle, MD   Reason(s) for Hospitalization  acute myocardial infarction  Diagnoses  Preliminary cause of death: Cardiac failure [facility]) Secondary Diagnoses (including complications and co-morbidities):  Principal Problem:   Cardiogenic shock ([place]) Active Problems:   Hypertension, uncontrolled   Type 2 diabetes mellitus ([place])   Obesity   A-fib ([place])   Acute hypoxemic respiratory failure (HCC)   Type 2 diabetes mellitus with vascular disease ([place])   Acute CHF (congestive heart failure) (HCC)   ACS (acute coronary syndrome) ([place])   Left main coronary artery disease   HCAP (healthcare-associated pneumonia)   Severe sepsis with septic shock (HCC)   Ventricular fibrillation ([place])   Cardiac arrest [facility])   Brief Hospital Course (including significant findings, care, treatment, and services provided and events leading to death)  Charles Bailey is a 72 y.o. year old male with history of hypertension, persistent atrial fibrillation on long-term anticoagulationusing Xarelto, hyperlipidemia,type II diabetes,and abdominal aortic aneurysm but no previous history of coronary artery disease. His cardiac history dates back to Mar 19, 2014 when he was found to be in atrial fibrillation by his primary care physician. He underwent a nuclear stress test at that time that was felt to be low risk. An echocardiogram revealed preserved left ventricular function with severe left atrial enlargement. He underwent DC cardioversion but rapidly went back into atrial fibrillation. He has been managed with long-term anticoagulation and rate control ever since. The patient states that he did well but has developed slow progression of symptoms  of exertional shortness of breath and fatigue over the last several months culminating in resting shortness of breath with hypoxemia consistent consistent with acute exacerbation of chronic combined systolic and diastolic congestive heart failure.Baseline EKG revealed atrial fibrillation without acute ST or T wave changes. Chest x-ray was clear but BNP level was elevated. Symptoms improved rapidly with intravenous Lasix and oxygen therapy using BiPAP. Serial troponin levels became slightly elevated peaking at 1.11, consistent with possible non-ST segment elevation myocardial infarction.   Cardiothoracic surgical consultation was requested and the patient originally seen in consultation on October 31, 2017.  The indications, risk, potential benefits of coronary artery bypass grafting and possible Maze procedure were discussed.  The patient's anticoagulation for atrial fibrillation was held and tentative plans made for surgery on 11/05/2017.  At approximately 7 AM on 10/16/2017 the patient developed sudden onset severe substernal chest pain associated with diffuse ST segment elevation on EKG.  Symptoms and signs of ischemia persisted despite elevating doses of intravenous nitroglycerin.  The patient was immediately seen in follow-up and plans made for emergent surgical revascularization.  Shortly after that the patient suffered V. fib arrest from which he was resuscitated.  He required DC cardioversion x3 and several periods of CPR.  He woke up briefly following his arrest and appeared to be neurologically intact.  The patient was subsequently intubated in the intensive care unit and taken directly to the operating room for emergent surgical revascularization.  The patient underwent coronary artery bypass grafting x1 with clipping of his left atrial appendage.  Intraoperative findings were notable for the presence of mild to moderate aortic insufficiency and extremely dense adhesions surrounding the entire  epicardial surface of the heart with  complete absence of the left side and inferior aspect of the pericardium.  These findings were felt likely to be related to the patient's previous emergency surgery performed in the past, and as a result extensive dissection was required after initiation of cardiopulmonary bypass and crossclamping of the aorta.  The terminal branches of the left circumflex system were too small for grafting.  Initially the patient was separated from bypass on milrinone and low-dose levo fed.  However, over the subsequent 30 to 45 minutes the patient developed progressive left ventricular failure with suboptimal cardiac output requiring escalating doses of epinephrine.  The patient subsequently developed V. fib arrest.  He was cardioverted on multiple occasions and open chest cardiac massage initiated.  Patient was rapidly heparinized and replaced on cardiopulmonary bypass.  An Impella left ventricular assist device was placed via right axillary approach.  The patient was subsequently successfully weaned from cardiopulmonary bypass and transferred to the intensive care unit in critical but stable condition.  During the initial 24 hours postoperatively the patient remained stable with progressively improving hemodynamics, stable oxygenation, and adequate renal function and urine output.  The patient woke up and follow commands appropriately.  Later in the day and during the evening of November 03, 2017 the patient developed progressive fevers and tachycardia associated with decreased cardiac output.  He was started on empiric antibiotics for possible hospital-acquired pneumonia on the morning of 2017/11/16.  By the afternoon of 2017-11-16 patient developed worsening oxygenation and agitation.  Chest radiograph demonstrated increased opacity and near complete collapse of the left lower lung.  Patient was evaluated and managed aggressively at the bedside by the advanced heart failure  team and the pulmonary and critical care team.  Flexible bronchoscopy was performed and notable for the presence of copious purulent secretions in the left lung.  Over the ensuing 6 hours patient developed worsening cardiovascular collapse refractory to all measures consistent with a combination of cardiogenic and septic shock.  Patient ultimately expired at 18:08 on 16-Nov-2017.  Patient's family was at the bedside.    Pertinent Labs and Studies  Significant Diagnostic Studies Dg Chest 1 View  Result Date: 11/10/2017 CLINICAL DATA:  Incorrect instrument count. EXAM: CHEST  1 VIEW COMPARISON:  Single-view of the chest earlier today. FINDINGS: No unexpected radiopaque foreign body is identified. Endotracheal tube is in place with the tip in good position at the level of the clavicular heads. Bilateral chest tubes are seen. Right IJ approach Swan-Ganz catheter tip is in the distal right main pulmonary artery. Tip of an Impella device projects in the left ventricle. Left atrial appendage clip is noted. No pneumothorax is identified. Hazy opacity is present throughout the left chest. The right lung is clear. No pneumothorax. IMPRESSION: Negative for retained surgical instrument. Support apparatus as described.  No pneumothorax. Hazy opacity throughout the left chest could be due to diffuse atelectasis and/or layering pleural effusion. Electronically Signed   By: Inge Rise M.D.   On: 10/18/2017 17:43   Dg Chest Port 1 View  Result Date: 11-16-17 CLINICAL DATA:  Oxygen desaturation. EXAM: PORTABLE CHEST 1 VIEW COMPARISON:  [id number] FINDINGS: Endotracheal tube is in place with tip 2.0 centimeters above the carina. A Swan-Ganz catheter tip is in place, tip overlying the level of the main pulmonary artery. An Impella device is stable. Mediastinal drains and chest tubes appear stable in appearance. No pneumothorax. Shallow lung inflation. The heart size is normal. There is increased opacity in the  LEFT lung base consistent with atelectasis or developing infiltrate. LEFT UPPER lobe and RIGHT LOWER lobe atelectasis. No pulmonary edema. IMPRESSION: Increased LEFT LOWER lobe opacity and bilateral atelectasis Electronically Signed   By: Nolon Nations M.D.   On: 11-22-17 16:49   Dg Chest Port 1 View  Result Date: 2017/11/22 CLINICAL DATA:  Congestive heart failure. EXAM: PORTABLE CHEST 1 VIEW COMPARISON:  11/03/2017 FINDINGS: Endotracheal tube terminates approximately 1 cm above the carina, slightly higher than on the prior study. Right jugular Swan-Ganz catheter terminates over the main pulmonary artery, unchanged. An Impella device, mediastinal drain, and bilateral chest tubes remain in place. Enteric tube courses into the upper abdomen with tip not imaged. The cardiomediastinal silhouette is unchanged. Pulmonary vascular congestion has decreased. Left lung volume loss is unchanged. No sizable pleural effusion or pneumothorax is identified. IMPRESSION: 1. Decreased pulmonary vascular congestion. 2. Persistent left lung volume loss. 3. Support devices as above. Electronically Signed   By: Logan Bores M.D.   On: 11/22/2017 09:54   Dg Chest Port 1 View  Result Date: 11/03/2017 CLINICAL DATA:  Followup Impella catheter. EXAM: PORTABLE CHEST 1 VIEW COMPARISON:  11/01/2017 FINDINGS: Endotracheal tube is at the carina. Consider withdrawal of 1 or 2 cm. Nasogastric tube enters the abdomen. Swan-Ganz catheter tip is in the main pulmonary artery. Impella device tip is at the left ventricular apex. Left chest tube is in place. There is no pneumothorax. There is less pulmonary edema and better aeration of the left lung. IMPRESSION: Lines and tubes well positioned, with the note that the endotracheal tube is at the carina. Impella appears the same. Less edema and better aeration of the left lung. Electronically Signed   By: Nelson Chimes M.D.   On: 11/03/2017 08:08   Dg Chest Port 1 View  Result Date:  10/22/2017 CLINICAL DATA:  Intubated, chest tube EXAM: PORTABLE CHEST 1 VIEW COMPARISON:  Chest radiograph from earlier today. FINDINGS: Endotracheal tube tip is 2.1 cm above the carina. Enteric tube terminates in the proximal stomach. Right internal jugular Swan-Ganz catheter terminates over the main pulmonary artery. Right axillary Impella device terminates over left ventricle. Left atrial appendage clip overlies the left mediastinum. Intact sternotomy wires. Biapical chest tubes and mediastinal drain are in place. Stable cardiomediastinal silhouette with mild cardiomegaly. No pneumothorax. No pleural effusion. Mildly improved aeration in the left lung. Persistent volume loss and asymmetric opacity throughout the left lung. Clear right lung. IMPRESSION: 1. No pneumothorax.  Well-positioned support structures as detailed. 2. Stable mild cardiomegaly. 3. Improved aeration in the left lung. Persistent volume loss and asymmetric opacity throughout the left lung, which could represent atelectasis or asymmetric pulmonary edema. Electronically Signed   By: Ilona Sorrel M.D.   On: 10/19/2017 18:58   Dg Chest Port 1 View  Result Date: 11/10/2017 CLINICAL DATA:  Chest pain.  Preop cardiovascular exam. EXAM: PORTABLE CHEST 1 VIEW COMPARISON:  One-view chest x-ray 11/07/2017. FINDINGS: Patient is intubated. Endotracheal tube terminates 2.5 cm above the carina. Defibrillator pads are in place. Left CP angle is not included on this study. Segmental atelectasis is present in the left. Aortic atherosclerosis is noted. IMPRESSION: 1. Cardiomegaly without failure. 2. Segmental atelectasis of the left lung. 3. Endotracheal tube terminates 2.5 cm above the carina. Electronically Signed   By: San Morelle M.D.   On: 11/13/2017 11:15   Dg Chest Portable 1 View  Result Date: 11/05/2017 CLINICAL DATA:  Shortness of breath and cough for 2 days, history of atrial  fibrillation, hypertension, abdominal aortic aneurysm  repair, diabetes mellitus EXAM: PORTABLE CHEST 1 VIEW COMPARISON:  Portable exam 1153 hours compared to 01/02/2017 FINDINGS: Enlargement of cardiac silhouette. Mediastinal contours and pulmonary vascularity normal. Calcified tortuous thoracic aorta. Mild scarring RIGHT base. No acute infiltrate, pleural effusion, or pneumothorax. Questionable new lytic lesion at the RIGHT humeral head versus artifact. IMPRESSION: RIGHT basilar scarring. No acute infiltrate period. Questionable lytic lesion at the RIGHT humeral head versus artifact; dedicated RIGHT shoulder radiographs recommended to assess. Electronically Signed   By: Lavonia Dana M.D.   On: 10/26/2017 12:15   Dg Shoulder Right Portable  Result Date: 10/29/2017 CLINICAL DATA:  Assess right shoulder for lytic lesion. EXAM: PORTABLE RIGHT SHOULDER COMPARISON:  None. FINDINGS: No discrete lytic lesion identified. AC joint degenerative changes noted. Degenerative changes. Calcific tendinosis at the rotator cuff tendon. No other acute abnormalities. IMPRESSION: No discrete lytic lesions seen in the right humeral head. Degenerative changes. Electronically Signed   By: Dorise Bullion III M.D   On: 11/09/2017 13:46   Dg Cyndy Freeze Guide Cv Line-no Report  Result Date: 10/27/2017 Fluoroscopy was utilized by the requesting physician.  No radiographic interpretation.    Microbiology Recent Results (from the past 240 hour(s))  MRSA PCR Screening     Status: None   Collection Time: 10/25/2017  6:15 PM  Result Value Ref Range Status   MRSA by PCR NEGATIVE NEGATIVE Final    Comment:        The GeneXpert MRSA Assay (FDA approved for NASAL specimens only), is one component of a comprehensive MRSA colonization surveillance program. It is not intended to diagnose MRSA infection nor to guide or monitor treatment for MRSA infections. Performed at [facility], [place] [address] [place], [place] [id number]   Culture, respiratory (non-expectorated)     Status:  None (Preliminary result)   Collection Time: 02-Dec-2017  8:40 AM  Result Value Ref Range Status   Specimen Description TRACHEAL ASPIRATE  Final   Special Requests NONE  Final   Gram Stain   Final    NO WBC SEEN RARE GRAM NEGATIVE COCCOBACILLI Performed at [facility], [place] [address], [place], [place] [address]    Culture PENDING  Incomplete   Report Status PENDING  Incomplete    Lab Basic Metabolic Panel: Recent Labs  Lab 11/01/17 0301 10/28/2017 0222 11/12/2017 0981  11/03/17 0303  11/03/17 1638 12/02/2017 0357 Dec 02, 2017 0813 12-02-17 1735 12-02-2017 1740  NA 139 137  --    < > 139  --  140 137 140 143 141  K 3.8 3.8  --    < > 3.6  --  3.8 3.7 3.7 5.0 4.9  CL 99 99  --    < > 109  --  103 108 118* 106 101  CO2 29 30  --   --  23  --   --  22 16* 18*  --   GLUCOSE 178* 178*  --    < > 143*  --  140* 107* 87 131* 124*  BUN 20 21  --    < > 21  --  _0 24* 29*  CREATININE 1.09 1.13  --    < > 1.45*   < > 1.50* 1.52* 1.07 2.01* 1.90*  CALCIUM 8.8* 8.9  --   --  7.5*  --   --  7.7* 5.5* 6.9*  --   MG 1.7  --  1.8  --  2.7*  --   --  2.0  --   --   --    < > = values in this interval not displayed.   Liver Function Tests: Recent Labs  Lab 11/03/2017 2330 11-18-17 0357  AST 17 752*  ALT 15 165*  ALKPHOS 66 43  BILITOT 0.5 1.4*  PROT 7.1 5.2*  ALBUMIN 3.9 3.1*   No results for input(s): LIPASE, AMYLASE in the last 168 hours. No results for input(s): AMMONIA in the last 168 hours. CBC: Recent Labs  Lab 11/09/2017 1144  10/14/2017 1610  10/17/2017 1953 10/24/2017 2259 11/03/17 0303 11/03/17 1638 2017/11/18 0357 2017-11-18 1740  WBC 10.8*   < > 13.7*  --  11.3* 9.7 10.0 11.4* 14.3*  --   NEUTROABS 8.1*  --  11.0*  --   --   --  7.9*  --  11.4*  --   HGB 14.7   < > 10.0*   < > 11.3* 11.0*  10.2* 10.5* 10.0*  9.5* 10.2* 8.5*  HCT 46.4   < > 31.0*   < > 35.3* 33.8*  30.0* 31.1* 31.0*  28.0* 31.4* 25.0*  MCV 93.2   < > 90.9  --  89.8 88.5 87.6 88.3 88.5  --   PLT 234   <  > 149*  --  116* 121* 120* 100* 100*  --    < > = values in this interval not displayed.   Cardiac Enzymes: Recent Labs  Lab 11/10/2017 0626 10/14/2017 1406 10/15/2017 1937 11/01/17 0019 11/13/2017 0754  TROPONINI 1.11* 0.96* 0.62* 0.38* 0.11*   Sepsis Labs: Recent Labs  Lab 10/27/2017 2259 11/03/17 0303 11/03/17 0800 11/03/17 1638 11-18-2017 0357 November 18, 2017 0633 11/18/2017 0813 18-Nov-2017 1042  PROCALCITON  --   --   --   --   --   --   --  0.82  WBC 9.7 10.0  --  11.4* 14.3*  --   --   --   LATICACIDVEN  --   --  2.0*  --   --  2.7* 1.7 1.7    Procedures/Operations   Cardiac catheterization October 31, 2017 Echocardiogram complete October 31, 2017 Emergency coronary artery bypass grafting x1, clipping of left atrial appendage, and placement of Impella CP left ventricular assist device November 02, 2017 Transesophageal echocardiogram November 02, 2017 Limited echocardiogram November 03, 2017 Limited echocardiogram 11/18/2017 Flexible bronchoscopy Nov 18, 2017   Rexene Alberts, MD 18-Nov-2017, 6:34 PM

## 2017-11-14 NOTE — Progress Notes (Signed)
Critical lactic acid 2.7. Notified Dr. Aundra Dubin. No new orders.

## 2017-11-14 NOTE — Progress Notes (Signed)
Chaplain was on 2H when nurse came and said that pt in room 19 was in critical condition.  Pt's sons were bedside along with multiple staff.  Chaplain offered ministry of presence and prayer.   Will continue to be available to wider family if they come to hospital and need words of comfort. Tamsen Snider Pager [phone]

## 2017-11-14 NOTE — Progress Notes (Addendum)
[facility] DAILY ICU PROGRESS NOTE                   [place]            [place] [address]          [phone]   2 Days Post-Op Procedure(s) (LRB): CORONARY ARTERY BYPASS GRAFTING (CABG) x 1, ON PUMP, LIMA TO LAD, USING LEFT INTERNAL MAMMARY ARTERY, HARVESTED LEFT GREATER SAPHENOUS VEIN ENDOSCOPICALLY (N/A) TRANSESOPHAGEAL ECHOCARDIOGRAM (TEE) (N/A) CLIPPING OF ATRIAL APPENDAGE USING ATRICLIP PRO2 102 PLACEMENT OF IMPELLA LEFT VENTRICULAR ASSIST DEVICE  Total Length of Stay:  LOS: 4 days   Subjective:  Remains on Vent, Impella remains place.  Does respond to commands, says he is in pain.  Objective: Vital signs in last 24 hours: Temp:  [98.8 F (37.1 C)-100.9 F (38.3 C)] 100.2 F (37.9 C) (10/22 0847) Pulse Rate:  [25-145] 125 (10/22 0847) Cardiac Rhythm: Atrial fibrillation (10/22 0800) Resp:  [0-33] 27 (10/22 0847) BP: (83-154)/(61-133) 99/71 (10/22 0800) SpO2:  [92 %-99 %] 93 % (10/22 0847) Arterial Line BP: (77-128)/(58-83) 110/77 (10/22 0847) FiO2 (%):  [50 %] 50 % (10/22 0754) Weight:  [101.5 kg] 101.5 kg (10/22 0500)  Filed Weights   11/07/2017 1900 11/03/17 0245 November 26, 2017 0500  Weight: 95 kg 98.7 kg 101.5 kg    Weight change: 13.3 kg   Hemodynamic parameters for last 24 hours: PAP: (29-51)/(20-39) 38/26 CVP:  [10 mmHg-16 mmHg] 16 mmHg CO:  [3.1 L/min-3.9 L/min] 3.5 L/min CI:  [1.5 L/min/m2-1.9 L/min/m2] 1.8 L/min/m2  Intake/Output from previous day: 10/21 0701 - 10/22 0700 In: 5731.3 [I.V.:3322; NG/GT:120; IV Piggyback:1967.8] Out: 2405 [Urine:1745; Emesis/NG output:50; Chest Tube:610]  Intake/Output this shift: Total I/O In: 346.2 [I.V.:251.3; Other:28.4; IV Piggyback:66.5] Out: 175 [Urine:175]  Current Meds: Scheduled Meds: . acetaminophen  1,000 mg Oral Q6H   Or  . acetaminophen (TYLENOL) oral liquid 160 mg/5 mL  1,000 mg Per Tube Q6H  . aspirin EC  325 mg Oral Daily   Or  . aspirin  324 mg Per Tube Daily  . chlorhexidine  gluconate (MEDLINE KIT)  15 mL Mouth Rinse BID  . insulin regular  0-10 Units Intravenous TID WC  . mouth rinse  15 mL Mouth Rinse 10 times per day  . polyvinyl alcohol  1 drop Both Eyes QID  . sodium chloride flush  3 mL Intravenous Q12H   Continuous Infusions: . sodium chloride 10 mL/hr at 26-Nov-2017 0900  . sodium chloride    . sodium chloride 10 mL/hr at 11/03/17 2204  . amiodarone 60 mg/hr (11/26/17 0900)  . calcium gluconate    . ceFEPime (MAXIPIME) IV 2 g (26-Nov-2017 0912)  . dexmedetomidine (PRECEDEX) IV infusion 0.7 mcg/kg/hr (2017-11-26 0900)  . EPINEPHrine 4 mg in dextrose 5% 250 mL infusion (16 mcg/mL) Stopped (2017-11-26 0216)  . famotidine (PEPCID) IV Stopped (11/03/17 2159)  . furosemide (LASIX) infusion 10 mg/hr (26-Nov-2017 0900)  . impella catheter heparin 50 unit/mL in dextrose 5%    . insulin 100 Units (November 26, 2017 0921)  . lactated ringers    . lactated ringers 20 mL/hr at 11-26-17 0900  . milrinone 0.5 mcg/kg/min (2017-11-26 0900)  . nitroGLYCERIN    . norepinephrine (LEVOPHED) Adult infusion 25 mcg/min (2017/11/26 0901)  . phenylephrine (NEO-SYNEPHRINE) Adult infusion Stopped (11/03/17 0858)  . potassium chloride 10 mEq (2017-11-26 0945)  . vancomycin 2,000 mg (Nov 26, 2017 0946)  . vancomycin     PRN Meds:.sodium chloride, metoprolol tartrate, midazolam, morphine injection,  ondansetron (ZOFRAN) IV, sodium chloride flush  General appearance: intubated, does respond to commands Heart: irregularly irregular rhythm Lungs: diminished breath sounds left base, coarse upper airway Abdomen: + distention, hypoactive BS Extremities: edema + Wound: EVH site clean and dry, aquacel in place on sternotomy  Lab Results: CBC: Recent Labs    11/03/17 1638 11-18-17 0357  WBC 11.4* 14.3*  HGB 10.0*  9.5* 10.2*  HCT 31.0*  28.0* 31.4*  PLT 100* 100*   BMET:  Recent Labs    Nov 18, 2017 0357 11-18-17 0813  NA 137 140  K 3.7 3.7  CL 108 118*  CO2 22 16*  GLUCOSE 107* 87  BUN 20 16    CREATININE 1.52* 1.07  CALCIUM 7.7* 5.5*    CMET: Lab Results  Component Value Date   WBC 14.3 (H) 2017/11/18   HGB 10.2 (L) Nov 18, 2017   HCT 31.4 (L) 2017/11/18   PLT 100 (L) 11/18/17   GLUCOSE 87 November 18, 2017   CHOL 114 05/28/2017   TRIG 205 (H) 05/28/2017   HDL 38 (L) 05/28/2017   LDLDIRECT 50 07/23/2013   LDLCALC 49 05/28/2017   ALT 165 (H) 11/18/2017   AST 752 (H) Nov 18, 2017   NA 140 Nov 18, 2017   K 3.7 Nov 18, 2017   CL 118 (H) 11-18-17   CREATININE 1.07 11-18-2017   BUN 16 11-18-2017   CO2 16 (L) 2017/11/18   TSH 1.490 12/21/2013   PSA 3.57 06/26/2015   INR 1.54 10/29/2017   HGBA1C 7.3 (H) 05/28/2017   MICROALBUR 4.7 01/01/2016      PT/INR:  Recent Labs    10/18/2017 1953  LABPROT 18.3*  INR 1.54   Radiology: No results found.   Assessment/Plan: S/P Procedure(s) (LRB): CORONARY ARTERY BYPASS GRAFTING (CABG) x 1, ON PUMP, LIMA TO LAD, USING LEFT INTERNAL MAMMARY ARTERY, HARVESTED LEFT GREATER SAPHENOUS VEIN ENDOSCOPICALLY (N/A) TRANSESOPHAGEAL ECHOCARDIOGRAM (TEE) (N/A) CLIPPING OF ATRIAL APPENDAGE USING ATRICLIP PRO2 45 PLACEMENT OF IMPELLA LEFT VENTRICULAR ASSIST DEVICE  1. CV- Atrial fibrillation, chronic for patient on Amiodarone drip, remains on Milrinone and Levophed- management per AHF, weaning drips as tolerated, output has decreased, ECHO performed and showed proper placement of Impella 2. Pulm- remains intubated, follows commands, will not wean to extubate until recovered from shock 3. Renal- creatinine WNL, + volume overload, on Lasix gtt, potassium supplementation ordered 4. ID- Septic shock, Lactic acid elevated, on broad spectrum ABX, LFTs elevated 5. Expected post operative Thrombocytopenia, mild at 100 will monitor  6. Expected post operative blood loss anemia, mild at 10.2 7. Dispo- patient remains ill, advance heart failure weaning drips, monitoring Impella, Echo showed okay placement, continue broad spectrum ABX for sepsis, okay to start  Heparin for Impella      Erin Barrett 18-Nov-2017 9:55 AM   I have seen and examined the patient and agree with the assessment and plan as outlined.  Cardiac output and mixed venous co-ox down early this morning in the setting of fever and tachycardia.  PA pressures and CVP stable, still relatively low.  Cardiac output and co-ox now much better since fever and HR down.  Agree w/ starting empiric Vanc + Maxepime for possible aspiration HCAP.  Start systemic heparin.  Continue current support.  Reassess LV function tomorrow.  Rexene Alberts, MD 11/18/2017 10:22 AM  ;

## 2017-11-14 NOTE — Telephone Encounter (Signed)
Pt is in hospital.

## 2017-11-14 NOTE — Progress Notes (Signed)
Pharmacy Antibiotic Note  Charles Bailey is a 72 y.o. male admitted on 11/13/2017 with concerns for sepsis. He underwent urgent CABG/Impella placement 10/22. Today, WBC up to 14.3, Tmax 100.9.  Pharmacy has been consulted for empiric vancomycin/cefepime dosing.   Plan:  Vancomycin 2000 mg IV x1 Vancomycin 750 mg IV every 12 hours.  Goal trough 15-20 mcg/mL.  Cefepime 2g IV q24h  Height: 5\' 8"  (172.7 cm) Weight: 223 lb 12.3 oz (101.5 kg) IBW/kg (Calculated) : 68.4  Temp (24hrs), Avg:99.9 F (37.7 C), Min:98.8 F (37.1 C), Max:100.9 F (38.3 C)  Recent Labs  Lab 10/19/2017 1953 11/03/2017 2259 11/03/17 0303 11/03/17 0800 11/03/17 1629 11/03/17 1638 11/26/17 0357 11-26-2017 0633  WBC 11.3* 9.7 10.0  --   --  11.4* 14.3*  --   CREATININE  --  1.40* 1.45*  --  1.59* 1.50* 1.52*  --   LATICACIDVEN 5.5*  --   --  2.0*  --   --   --  2.7*    Estimated Creatinine Clearance: 52.2 mL/min (A) (by C-G formula based on SCr of 1.52 mg/dL (H)).    No Known Allergies  Antimicrobials this admission: Vancomycin 10/22 >> Cefepime 10/22 >> Cefuroxime 10/21 >> 10/21   Microbiology results: 10/22: BCx: * 10/22 TA: * 10/22 UCx: * 10/17 MRSA PCR: negative  Thank you for allowing pharmacy to be a part of this patient's care.   Claiborne Billings, PharmD PGY2 Cardiology Pharmacy Resident Phone [phone] Please check AMION for all Pharmacist numbers by unit 11-26-2017 8:25 AM

## 2017-11-14 NOTE — Progress Notes (Addendum)
[facility] BRIEF SICU PROGRESS NOTE  2 Days Post-Op  S/P Procedure(s) (LRB): CORONARY ARTERY BYPASS GRAFTING (CABG) x 1, ON PUMP, LIMA TO LAD, USING LEFT INTERNAL MAMMARY ARTERY, HARVESTED LEFT GREATER SAPHENOUS VEIN ENDOSCOPICALLY (N/A) TRANSESOPHAGEAL ECHOCARDIOGRAM (TEE) (N/A) CLIPPING OF ATRIAL APPENDAGE USING ATRICLIP PRO2 45 PLACEMENT OF IMPELLA LEFT VENTRICULAR ASSIST DEVICE   Over the course of the day the patient developed worsening respiratory distress and hypoxia with marked increase in airway secretions CXR revealed markedly increased opacity c/w LLL collapse and worsening pneumonia Flexible bronchoscopy performed by Dr Nelda Marseille at the bedside revealed copious purulent secretions on the left side The patient has subsequently developed shock that at this point remains refractory to high dose inotropic agents Patient's son's updated regarding these developments at the bedside  Plan: Drs Milas Kocher and myself at the bedside.  Continue current aggressive measures for now.  Prognosis grim.  Patient would not be considered a candidate for ECMO support.    Rexene Alberts, MD 2017/11/26 5:19 PM

## 2017-11-14 NOTE — Progress Notes (Addendum)
Advanced Heart Failure Rounding Note  PCP-Cardiologist: No primary care provider on file.   Subjective:    Events: 10/19: Anterior MI while awaiting CABG, emergent CABG with LIMA-LAD and Impella placement.  10/20: Impella readjustment with resolution of suction alarms.   Patient is awake/alert on vent.    Patient had Tm 100.8, WBCs up to 14.  Creatinine to 1.5, 1745 cc UOP.  LDH up to 1832 with clear urine.    Atrial fibrillation rate faster overnight, currently 120s-130s.  MAP in 70s on norepinephrine 23, milrinone 0.5.  Off epinephrine.  Remains on Lasix gtt at 10 and amiodarone at 30.   Swan:  CVP 16 PA 44/29 CI 1.7 PAPI 0.9 Co-ox 35%  Impella:  P9, 3.5 L/min flow   Objective:   Weight Range: 101.5 kg Body mass index is 34.02 kg/m.   Vital Signs:   Temp:  [98.8 F (37.1 C)-100.9 F (38.3 C)] 100.2 F (37.9 C) (10/22 0700) Pulse Rate:  [25-145] 130 (10/22 0700) Resp:  [0-33] 25 (10/22 0700) BP: (83-154)/(61-133) 133/84 (10/22 0500) SpO2:  [92 %-99 %] 94 % (10/22 0700) Arterial Line BP: (77-128)/(58-83) 101/75 (10/22 0700) FiO2 (%):  [50 %] 50 % (10/22 0400) Weight:  [101.5 kg] 101.5 kg (10/22 0500) Last BM Date: 10/14/2017  Weight change: Filed Weights   10/18/2017 1900 11/03/17 0245 18-Nov-2017 0500  Weight: 95 kg 98.7 kg 101.5 kg    Intake/Output:   Intake/Output Summary (Last 24 hours) at 11/18/2017 0744 Last data filed at 11/18/17 0713 Gross per 24 hour  Intake 5756.16 ml  Output 2405 ml  Net 3351.16 ml      Physical Exam    General:  Intubated, awake.  HEENT: Normal Neck: Supple. JVP not elevated.  Cor: PMI nonpalpable. Tachy, irregular rate & rhythm. No rubs, gallops or murmurs. Lungs: Decreased left base.  Abdomen: Soft, mildly distended. No hepatosplenomegaly. No bruits or masses. Good bowel sounds. Extremities: No cyanosis, clubbing, rash.  Trace ankle edema.  Neuro: Awake/alert on vent   Telemetry   Atrial fibrillation  120s-130s  Labs    CBC Recent Labs    11/03/17 0303 11/03/17 1638 18-Nov-2017 0357  WBC 10.0 11.4* 14.3*  NEUTROABS 7.9*  --  11.4*  HGB 10.5* 10.0*  9.5* 10.2*  HCT 31.1* 31.0*  28.0* 31.4*  MCV 87.6 88.3 88.5  PLT 120* 100* 371*   Basic Metabolic Panel Recent Labs    11/03/17 0303  11/03/17 1638 18-Nov-2017 0357  NA 139  --  140 137  K 3.6  --  3.8 3.7  CL 109  --  103 108  CO2 23  --   --  22  GLUCOSE 143*  --  140* 107*  BUN 21  --  22 20  CREATININE 1.45*   < > 1.50* 1.52*  CALCIUM 7.5*  --   --  7.7*  MG 2.7*  --   --  2.0   < > = values in this interval not displayed.   Liver Function Tests Recent Labs    11-18-17 0357  AST 752*  ALT 165*  ALKPHOS 43  BILITOT 1.4*  PROT 5.2*  ALBUMIN 3.1*   No results for input(s): LIPASE, AMYLASE in the last 72 hours. Cardiac Enzymes Recent Labs    10/18/2017 0754  TROPONINI 0.11*    BNP: BNP (last 3 results) Recent Labs    12/31/16 1652 02/27/17 1143 10/29/2017 1144  BNP 377* 323* 729.7*  ProBNP (last 3 results) No results for input(s): PROBNP in the last 8760 hours.   D-Dimer No results for input(s): DDIMER in the last 72 hours. Hemoglobin A1C No results for input(s): HGBA1C in the last 72 hours. Fasting Lipid Panel No results for input(s): CHOL, HDL, LDLCALC, TRIG, CHOLHDL, LDLDIRECT in the last 72 hours. Thyroid Function Tests No results for input(s): TSH, T4TOTAL, T3FREE, THYROIDAB in the last 72 hours.  Invalid input(s): FREET3  Other results:   Imaging     No results found.   Medications:     Scheduled Medications: . acetaminophen  1,000 mg Oral Q6H   Or  . acetaminophen (TYLENOL) oral liquid 160 mg/5 mL  1,000 mg Per Tube Q6H  . aspirin EC  325 mg Oral Daily   Or  . aspirin  324 mg Per Tube Daily  . chlorhexidine gluconate (MEDLINE KIT)  15 mL Mouth Rinse BID  . insulin regular  0-10 Units Intravenous TID WC  . mouth rinse  15 mL Mouth Rinse 10 times per day  . metolazone   2.5 mg Oral Once  . polyvinyl alcohol  1 drop Both Eyes QID  . sodium chloride flush  3 mL Intravenous Q12H     Infusions: . sodium chloride 10 mL/hr at November 26, 2017 0713  . sodium chloride    . sodium chloride 10 mL/hr at 11/03/17 2204  . amiodarone 60 mg/hr (2017-11-26 0742)  . cefUROXime (ZINACEF)  IV Stopped (11/03/17 2057)  . dexmedetomidine (PRECEDEX) IV infusion 0.7 mcg/kg/hr (Nov 26, 2017 0713)  . EPINEPHrine 4 mg in dextrose 5% 250 mL infusion (16 mcg/mL) Stopped (11/26/17 0216)  . famotidine (PEPCID) IV Stopped (11/03/17 2159)  . furosemide (LASIX) infusion 10 mg/hr (26-Nov-2017 0729)  . impella catheter heparin 50 unit/mL in dextrose 5%    . insulin 6 mL/hr at Nov 26, 2017 0713  . lactated ringers    . lactated ringers 20 mL/hr at 11/26/2017 0713  . milrinone 0.5 mcg/kg/min (11/26/17 0724)  . nitroGLYCERIN    . norepinephrine (LEVOPHED) Adult infusion 23 mcg/min (November 26, 2017 0713)  . phenylephrine (NEO-SYNEPHRINE) Adult infusion Stopped (11/03/17 0858)  . potassium chloride 10 mEq (11-26-2017 0735)     PRN Medications:  sodium chloride, metoprolol tartrate, midazolam, morphine injection, ondansetron (ZOFRAN) IV, sodium chloride flush   Assessment/Plan   1. CAD: S/p emergent CABG on 10/27/2017 with anterior STEMI and vfib arrest.  Cath several days prior had showed 70% hazy LM stenosis.  Patient had LIMA-LAD, unable to graft LCx (small vessels).  - Continue ASA and statin.  2. Atrial fibrillation: Chronic.  Patient did not have Maze procedure due to instability. This morning, he is in atrial fibrillation with RVR in 120s-130s.  MAP stable but cardiac output lower.  - Increase amiodarone to 60 mg/hr for rate control.  He would be very unlikely to cardiovert with chronic (permanent) atrial fibrillation.  3. Vfib arrest: In setting of anterior MI.  - Continue amiodarone gtt.  4. Shock: Primarily cardiogenic but with low grade fever, now concern for septic component.  Echo post-op has shown  hyperkinetic LV base with severe hypokinesis of the apex and peri-apical segments, EF 30% range.  RV function appeared relatively preserved. Impella at P9 with 3.5 L/min flow, no suction alarms but LDH is higher today though urine clear.  CI 1.7 on Swan with co-ox low this morning at 35%.  He is in atrial fibrillation with RVR.  He has had low grade fever with some concern for sepsis contribution. MAP 70s  on milrinone 0.5, norepinephrine 23.  Lasix running at 10 mg/hr, creatinine stable but I > O and CVP still high.  - Repeat co-ox and send lactate. Low output concerning.  Low PAPI suggests RV dysfunction though RV looked ok on yesterday's echo.  Will look again today by echo.  Need to continue milrinone 0.5.  - Keep Impella at P9.  LDH higher, will look at position under echo guidance again this morning.  For now, running heparin in purge solution and no systemic heparin.  Will start systemic heparin when ok for this per Dr. Roxy Manns. - Titrate down norepinephrine as MAP allows, probably not much room today.   - Continue Lasix gtt, add metolazine 2.5 x 1 today and replace K.  Keep CVP around 10 with Impella to allow filling.  5. Anemia: Peri-op blood loss.  Stable hgb today.  6. Thrombocytopenia: Mild.  Follow carefully with Impella.  7. Hypoxemic respiratory failure: Pulmonary edema.  Intubated, diurese.  8. AKI: Creatinine stable at 1.5 today. Follow closely.  9. ID: Low grade fever to 100.8, WBCs at 14.  ?Sepsis component to shock.  Possible left lung infiltrate.  - Culture - Send procalcitonin.  - Would cover empirically with vancomycin/cefepime.   CRITICAL CARE Performed by: Loralie Champagne  Total critical care time: 45 minutes  Critical care time was exclusive of separately billable procedures and treating other patients.  Critical care was necessary to treat or prevent imminent or life-threatening deterioration.  Critical care was time spent personally by me on the following activities:  development of treatment plan with patient and/or surrogate as well as nursing, discussions with consultants, evaluation of patient's response to treatment, examination of patient, obtaining history from patient or surrogate, ordering and performing treatments and interventions, ordering and review of laboratory studies, ordering and review of radiographic studies, pulse oximetry and re-evaluation of patient's condition.  Length of Stay: 4  Loralie Champagne, MD  November 20, 2017, 7:44 AM  Advanced Heart Failure Team Pager [phone] (M-F; 7a - 4p)  Please contact [facility] for night-coverage after hours (4p -7a ) and weekends on [email or web address]  LVAD position evaluated with echo.  Positioned at 3.6 cm, stable.  Given no suction alarms, clear urine, and stable hgb/plts, will not change position. Elevated LDH but think this may be related to shock liver, all LFTs elevated.    Repeat co-ox better at 59%.   If he does not improve (still marginal cardiac output), could consider upgrade to Impella 5.0.   Loralie Champagne November 20, 2017 9:48 AM

## 2017-11-14 NOTE — Progress Notes (Signed)
  Echocardiogram 2D Echocardiogram limited for impella  has been performed.  [name] M Nov 19, 2017, 9:48 AM

## 2017-11-14 NOTE — Progress Notes (Signed)
Patient time of death 48. Pronounced by Dr. Roxy Manns, family in attendance as well as chaplain and staff. Funeral home and arrangements are unknown at this time, family will be given information to call back when decision has been made. Patient has been cleaned and prepped for family to visit.

## 2017-11-14 NOTE — Consult Note (Signed)
   [facility] CM Inpatient Consult   11/05/2017  JAMEIRE KOUBA 12/06/1945 [id number]    Chart reviewed. Noted Mr. Kingsley expired while hospitalized. Changed [facility] active status to reflect not active.  Marthenia Rolling, MSN-Ed, RN,BSN [facility] Liaison [phone]

## 2017-11-14 NOTE — Progress Notes (Signed)
Notified Dr. Roxan Hockey of pt's increased HR 130s-140s sustained. CVP 15. On 78mcg Levophed. Epi off. Has had a low-grade temp 100.4 most of shift despite tylenol.   Given orders for amio 150mg  bolus. If HR does not come down with that, per MD can try 2.5mg  IV metoprolol cautiously.  Will administer bolus and continue to monitor.

## 2017-11-14 NOTE — Consult Note (Signed)
NAME:  Charles Bailey, MRN:  [id number], DOB:  1945-06-18, LOS: 4 ADMISSION DATE:  10/21/2017, CONSULTATION DATE:  2017-11-16 REFERRING MD:  Dr. Roxy Manns, CHIEF COMPLAINT:  Hemodynamic instability    Brief History   72 year old male who underwent emergent CABG on 10/20 and had impella placed. 10/22 developed instability and PCCM consulted.   Past Medical History  HTN, HLD, DM, CHF, A-fib on rivaroxaban.   Significant Hospital Events   10/17: admit for presumed CHF exacerbation 10/18: developed chest discomfort, ST elevations. Symptoms resolved on own. Cath with Left main 70% lesion 10/19: scheduled for CABG 10/23, however had Anterior MI and VF arrest in the interim and underwent emergent CAGB. Impella.  10/21 Afib RVR >started amiodarone 10/22 concern for septic shock. Overall worsening of hemodynamics.   Consults: date of consult/date signed off & final recs:  Heart failure PCCM  Procedures (surgical and bedside):   CABG 10/19  > LIMA - LAD Impella 10/19 > ETT 10/19 > RIJ swan/intorducer 10/20 >  Art line 10/20 >  Significant Diagnostic Tests:  10/18 LHC > Smooth 70% mid left main stenosis  10/18 echo bubble study >  LVEF 45-50% 10/20 TEE > for Impella placement 10/21 Echo > for Impella reposition.  Micro Data:  Blood 10/20 > Urine 10/20 > Sputum 10/20 >  Antimicrobials:  Cefepime 10/22 > Vancomycin 10/22 >  Subjective:  Patient decompensating from a hemodyamic and respiratory standpoint, PCCM consulted for assistance with vent management Patient is unresponsive  Objective   Blood pressure (!) 84/71, pulse (!) 137, temperature 100 F (37.8 C), resp. rate 20, height 5\' 8"  (1.727 m), weight 101.5 kg, SpO2 (!) 7 %. PAP: (30-50)/(18-35) 37/26 CVP:  [13 mmHg-30 mmHg] 30 mmHg CO:  [3.1 L/min-3.9 L/min] 3.5 L/min CI:  [1.5 L/min/m2-1.9 L/min/m2] 1.8 L/min/m2  Vent Mode: SIMV;PSV;PRVC FiO2 (%):  [50 %-100 %] 80 % Set Rate:  [20 bmp] 20 bmp Vt Set:  [540 mL] 540  mL PEEP:  [10 cmH20] 10 cmH20 Pressure Support:  [10 cmH20] 10 cmH20 Plateau Pressure:  [26 cmH20-27 cmH20] 26 cmH20   Intake/Output Summary (Last 24 hours) at 16-Nov-2017 1700 Last data filed at 11-16-17 1600 Gross per 24 hour  Intake 4884.89 ml  Output 2515 ml  Net 2369.89 ml   Filed Weights   11/11/2017 1900 11/03/17 0245 Nov 16, 2017 0500  Weight: 95 kg 98.7 kg 101.5 kg    Examination: General: Acutely ill appearing male, NAD HENT: [place]/AT, PERRL, EOM-I and MMM prior to paralytic use Lungs: No BS on the left with normal BS on the right Cardiovascular: Tachy, irregular, Nl S1/S2 and -M/R/G Abdomen: Soft, NT, ND and +BS Extremities: +edema and -tenderness Neuro: Awake and moving ext prior to paralytic an sedation use Skin: surgical changes noted  Resolved Hospital Problem list   N/A  Assessment & Plan:  72 year old male with PMH above who presents to PCCM with acute hypoxemic and hypercarbic respiratory failure, HCAP and mixed shock post CABG.  Patient is decompensating rapidly.  We spent approximately 90 minutes bedside attempting to stabilize hemodynamics with cardiology and CVTS bedside.  Patient is requiring very large amounts of catecholamines to stabilize.  At this point, patient is on 30 mcg of epi, 20 mcg of levo, 0.03 mcg of vaso, 5 mcg of dobutamine and 0.5 mcg of milrinone with BP that continues to drift down and requiring epi pushes.  I was unable to hear BS on the left, U/S exam of the chest  showed B-lines, CXR that I reviewed myself showed LLL collapse and a bronchoscopy was performed that showed purulent material in the LLL obstructing the bronchus.  Based on that PAC was used, SVR was 1950 with CO of 2.56 and CVP of 25.  Cefepime and vanc were started.  Cultures sent.  CVTS is speaking with family.  Prognosis is very poor here.  Will addend the note after further attempts at stabilization.  Disposition / Summary of Today's Plan November 24, 2017        Diet:  NPO Pain/Anxiety/Delirium protocol (if indicated): precedex VAP protocol (if indicated): Per protocol DVT prophylaxis: heparin infusion GI prophylaxis: pepcid Hyperglycemia protocol: cbg/ssi Mobility: BR Code Status: Full Family Communication: sons at bedside discussing with primary service.   Labs   CBC: Recent Labs  Lab 10/28/2017 1144  10/23/2017 1610  11/12/2017 1953 11/08/2017 2259 11/03/17 0303 11/03/17 1638 24-Nov-2017 0357  WBC 10.8*   < > 13.7*  --  11.3* 9.7 10.0 11.4* 14.3*  NEUTROABS 8.1*  --  11.0*  --   --   --  7.9*  --  11.4*  HGB 14.7   < > 10.0*   < > 11.3* 11.0*  10.2* 10.5* 10.0*  9.5* 10.2*  HCT 46.4   < > 31.0*   < > 35.3* 33.8*  30.0* 31.1* 31.0*  28.0* 31.4*  MCV 93.2   < > 90.9  --  89.8 88.5 87.6 88.3 88.5  PLT 234   < > 149*  --  116* 121* 120* 100* 100*   < > = values in this interval not displayed.    Basic Metabolic Panel: Recent Labs  Lab 11/01/17 0301 11/03/2017 0222 11/06/2017 3710  10/24/2017 2259 11/03/17 0303 11/03/17 1629 11/03/17 1638 2017/11/24 0357 24-Nov-2017 0813  NA 139 137  --    < > 141 139  --  140 137 140  K 3.8 3.8  --    < > 3.9 3.6  --  3.8 3.7 3.7  CL 99 99  --    < > 106 109  --  103 108 118*  CO2 29 30  --   --   --  23  --   --  22 16*  GLUCOSE 178* 178*  --    < > 190* 143*  --  140* 107* 87  BUN 20 21  --    < > 22 21  --  22 20 16   CREATININE 1.09 1.13  --    < > 1.40* 1.45* 1.59* 1.50* 1.52* 1.07  CALCIUM 8.8* 8.9  --   --   --  7.5*  --   --  7.7* 5.5*  MG 1.7  --  1.8  --   --  2.7*  --   --  2.0  --    < > = values in this interval not displayed.   GFR: Estimated Creatinine Clearance: 74.1 mL/min (by C-G formula based on SCr of 1.07 mg/dL). Recent Labs  Lab 10/14/2017 2259 11/03/17 0303 11/03/17 0800 11/03/17 1638 11-24-17 0357 24-Nov-2017 0633 11-24-17 0813 11/24/17 1042  PROCALCITON  --   --   --   --   --   --   --  0.82  WBC 9.7 10.0  --  11.4* 14.3*  --   --   --   LATICACIDVEN  --   --  2.0*  --   --  2.7*  1.7 1.7    Liver Function Tests: Recent  Labs  Lab 10/16/2017 2330 11/11/17 0357  AST 17 752*  ALT 15 165*  ALKPHOS 66 43  BILITOT 0.5 1.4*  PROT 7.1 5.2*  ALBUMIN 3.9 3.1*   No results for input(s): LIPASE, AMYLASE in the last 168 hours. No results for input(s): AMMONIA in the last 168 hours.  ABG    Component Value Date/Time   PHART 7.206 (L) 11-Nov-2017 1615   PCO2ART 50.9 (H) Nov 11, 2017 1615   PO2ART 64.0 (L) 11-11-2017 1615   HCO3 20.0 11/11/17 1615   TCO2 21 (L) 11-11-17 1615   ACIDBASEDEF 8.0 (H) 11-Nov-2017 1615   O2SAT 85.0 2017-11-11 1615     Coagulation Profile: Recent Labs  Lab 10/16/2017 1610 10/18/2017 1953  INR 1.75 1.54    Cardiac Enzymes: Recent Labs  Lab 10/15/2017 0626 10/30/2017 1406 11/07/2017 1937 11/01/17 0019 10/18/2017 0754  TROPONINI 1.11* 0.96* 0.62* 0.38* 0.11*    HbA1C: Hgb A1c MFr Bld  Date/Time Value Ref Range Status  05/28/2017 10:03 AM 7.3 (H) <5.7 % of total Hgb Final    Comment:    For someone without known diabetes, a hemoglobin A1c value of 6.5% or greater indicates that they may have  diabetes and this should be confirmed with a follow-up  test. . For someone with known diabetes, a value <7% indicates  that their diabetes is well controlled and a value  greater than or equal to 7% indicates suboptimal  control. A1c targets should be individualized based on  duration of diabetes, age, comorbid conditions, and  other considerations. . Currently, no consensus exists regarding use of hemoglobin A1c for diagnosis of diabetes for children. .   12/27/2016 09:03 AM 7.2 (H) <5.7 % of total Hgb Final    Comment:    For someone without known diabetes, a hemoglobin A1c value of 6.5% or greater indicates that they may have  diabetes and this should be confirmed with a follow-up  test. . For someone with known diabetes, a value <7% indicates  that their diabetes is well controlled and a value  greater than or equal to 7%  indicates suboptimal  control. A1c targets should be individualized based on  duration of diabetes, age, comorbid conditions, and  other considerations. . Currently, no consensus exists regarding use of hemoglobin A1c for diagnosis of diabetes for children. .     CBG: Recent Labs  Lab 11/11/2017 1142 2017-11-11 1236 November 11, 2017 1338 2017/11/11 1446 11-11-17 1547  GLUCAP 177* 137* 109* 138* 140*    Admitting History of Present Illness.   72 year old male with PMH as below, which is significant for Atrial fibrillation on xarelto, DM, and CHF. He was admitted 10/17 with complaints of chest tightness and SOB from presumed CHF exacerbation. The following day he developed chest discomfort and ST changes and was taken emergently to the cath lab. Symptoms and EKG changes resolved before cath done. Cath demonstrated 70% distal L [place] lesion. CABG recommended and scheduled for 10/23, however, 10/19 he suffered anterior MI and VF arrest. He was resuscitated and taken for emergent CABG 10/20. Impella placed for post-op support.  He required pressors as well for shock. Post op course complicated by atrial fibrillation with RVR. 10/22 he devleoped worsening oxygenation and shock. PCCM asked to consult.   Review of Systems:   unable  Past Medical History  He,  has a past medical history of A-fib ([place]), Adenomatous colon polyp (2003), CHF (congestive heart failure) ([place]), Diabetes mellitus, Hyperlipidemia, and Hypertension.   Surgical History  Past Surgical History:  Procedure Laterality Date  . ABDOMINAL AORTIC ANEURYSM REPAIR  2007  . CARDIOVERSION N/A 03/29/2014   Procedure: CARDIOVERSION;  Surgeon: Josue Hector, MD;  Location: [facility];  Service: Cardiovascular;  Laterality: N/A;  . CLIPPING OF ATRIAL APPENDAGE  11/03/2017   Procedure: CLIPPING OF ATRIAL APPENDAGE USING ATRICLIP PRO2 14;  Surgeon: Rexene Alberts, MD;  Location: [facility];  Service: Open Heart Surgery;;  . CORONARY ARTERY  BYPASS GRAFT N/A 10/23/2017   Procedure: CORONARY ARTERY BYPASS GRAFTING (CABG) x 1, ON PUMP, LIMA TO LAD, USING LEFT INTERNAL MAMMARY ARTERY, HARVESTED LEFT GREATER SAPHENOUS VEIN ENDOSCOPICALLY;  Surgeon: Rexene Alberts, MD;  Location: [facility];  Service: Open Heart Surgery;  Laterality: N/A;  . INGUINAL HERNIA REPAIR Right 1968  . LEFT HEART CATH AND CORONARY ANGIOGRAPHY N/A 11/01/2017   Procedure: LEFT HEART CATH AND CORONARY ANGIOGRAPHY;  Surgeon: Troy Sine, MD;  Location: [facility];  Service: Cardiovascular;  Laterality: N/A;  . PLACEMENT OF IMPELLA LEFT VENTRICULAR ASSIST DEVICE  11/06/2017   Procedure: PLACEMENT OF [place] LEFT VENTRICULAR ASSIST DEVICE;  Surgeon: Rexene Alberts, MD;  Location: [facility];  Service: Open Heart Surgery;;  . TEE WITHOUT CARDIOVERSION N/A 11/08/2017   Procedure: TRANSESOPHAGEAL ECHOCARDIOGRAM (TEE);  Surgeon: Rexene Alberts, MD;  Location: [facility];  Service: Open Heart Surgery;  Laterality: N/A;     Social History   Social History   Socioeconomic History  . Marital status: Married    Spouse name: Not on file  . Number of children: Not on file  . Years of education: Not on file  . Highest education level: Not on file  Occupational History  . Not on file  Social Needs  . Financial resource strain: Not on file  . Food insecurity:    Worry: Not on file    Inability: Not on file  . Transportation needs:    Medical: Not on file    Non-medical: Not on file  Tobacco Use  . Smoking status: Former Smoker    Years: 15.00    Types: Cigarettes    Last attempt to quit: 03/03/2005    Years since quitting: 12.6  . Smokeless tobacco: Never Used  Substance and Sexual Activity  . Alcohol use: Yes    Alcohol/week: 4.0 standard drinks    Types: 4 Cans of beer per week  . Drug use: No  . Sexual activity: Not on file  Lifestyle  . Physical activity:    Days per week: Not on file    Minutes per session: Not on file  . Stress: Not on file   Relationships  . Social connections:    Talks on phone: Not on file    Gets together: Not on file    Attends religious service: Not on file    Active member of club or organization: Not on file    Attends meetings of clubs or organizations: Not on file    Relationship status: Not on file  . Intimate partner violence:    Fear of current or ex partner: Not on file    Emotionally abused: Not on file    Physically abused: Not on file    Forced sexual activity: Not on file  Other Topics Concern  . Not on file  Social History Narrative  . Not on file  ,  reports that he quit smoking about 12 years ago. His smoking use included cigarettes. He quit after 15.00 years  of use. He has never used smokeless tobacco. He reports that he drinks about 4.0 standard drinks of alcohol per week. He reports that he does not use drugs.   Family History   His family history includes Diabetes in his paternal aunt; Heart disease in his father, paternal grandfather, and paternal uncle; Heart failure in his mother.   Allergies No Known Allergies   Home Medications  Prior to Admission medications   Medication Sig Start Date End Date Taking? Authorizing Provider  amLODipine (NORVASC) 10 MG tablet Take 1 tablet by mouth once daily (need to be seen before any further refills) Patient taking differently: Take 10 mg by mouth daily.  02/17/17  Yes Susy Frizzle, MD  Cholecalciferol (VITAMIN D3) 1000 UNITS CAPS Take 2,000 Units by mouth daily.    Yes [provider]  cloNIDine (CATAPRES) 0.2 MG tablet Take 1 tablet by mouth twice a day Patient taking differently: Take 0.2 mg by mouth 2 (two) times daily.  06/18/17  Yes Susy Frizzle, MD  fish oil-omega-3 fatty acids 1000 MG capsule Take 1,200 mg by mouth daily.   Yes [provider]  furosemide (LASIX) 40 MG tablet TAKE 1 TABLET BY MOUTH EVERY DAY Patient taking differently: Take 40 mg by mouth daily as needed (swelling).  06/17/17  Yes Pickard,  Cammie Mcgee, MD  KLOR-CON M20 20 MEQ tablet TAKE 1 TABLET BY MOUTH EVERY DAY Patient taking differently: Take 20 mEq by mouth daily as needed (when taking furosemide).  06/17/17  Yes Susy Frizzle, MD  lisinopril (PRINIVIL,ZESTRIL) 40 MG tablet Take 1 tablet by mouth every day Patient taking differently: Take 40 mg by mouth daily.  05/05/17  Yes Susy Frizzle, MD  metFORMIN (GLUCOPHAGE) 1000 MG tablet Take 1 tablet by mouth twice a day with meals Patient taking differently: Take 1,000 mg by mouth 2 (two) times daily with a meal.  08/19/17  Yes Susy Frizzle, MD  metoprolol tartrate (LOPRESSOR) 50 MG tablet Take 1 tablet by mouth twice a day Patient taking differently: Take 50 mg by mouth 2 (two) times daily.  08/29/17  Yes Susy Frizzle, MD  pravastatin (PRAVACHOL) 40 MG tablet Take 1 tablet by mouth daily Patient taking differently: Take 40 mg by mouth every evening.  04/09/17  Yes Susy Frizzle, MD  rivaroxaban (XARELTO) 20 MG TABS tablet Take 1 tablet (20 mg total) by mouth daily with supper. 10/21/17  Yes Susy Frizzle, MD     Critical care time: 35  mins    The patient is critically ill with multiple organ systems failure and requires high complexity decision making for assessment and support, frequent evaluation and titration of therapies, application of advanced monitoring technologies and extensive interpretation of multiple databases.   Critical Care Time devoted to patient care services described in this note is  90  Minutes. This time reflects time of care of this signee Dr Jennet Maduro. This critical care time does not reflect procedure time, or teaching time or supervisory time of PA/NP/Med student/Med Resident etc but could involve care discussion time.  Rush Farmer, M.D. [facility] Pulmonary/Critical Care Medicine. Pager: [phone]. After hours pager: [phone].  2017/11/27 5:26 PM

## 2017-11-14 NOTE — Progress Notes (Signed)
Patient started having issues with maintaining oxygen saturation as well as blood pressure at approximately  1130 today. Respiratory was called and the patient was suctioned, probes were changed, and patient was repositioned. Ventilator settings were changed and he seemed to recover some. Dr. Aundra Dubin was called and did change some orders. Patient responded and improved for several hours with the increase in medications then started to decompensate again. Respiratory was called again as well as paging heart failure team, orders were given and followed. Dr. Aundra Dubin rounded on patient and RN expressed concern over oxygen saturation issues as well as blood pressure. Pulmonary was consulted and a bedside bronch was performed. Patient continued to decline. The following are the medications that were given per verbal order but were not able to be scanned due to the emergent situation:  Drips-started and titrated per verbal orders  Dobutamine Epinephrine Phenylephrine Sodium bicarb Vasopressin  Pushed- 11 rounds of epinephrine 5 bicarb 1 vasopressin  Patient passed away at 1808 with family in attendance. Dr. Roxy Manns pronounced. Staff in attendance as well as chaplain.

## 2017-11-14 NOTE — Progress Notes (Signed)
Critical calcium 5.5, notified Dr. Aundra Dubin. Orders to be placed.

## 2017-11-14 DEATH — deceased
